# Patient Record
Sex: Female | Born: 1965 | Race: White | Hispanic: No | Marital: Married | Smoking: Never smoker
Health system: Southern US, Community
[De-identification: ages and names within clinical notes are randomized; demographics above are authoritative.]

## PROBLEM LIST (undated history)

## (undated) DIAGNOSIS — R7301 Impaired fasting glucose: Secondary | ICD-10-CM

## (undated) DIAGNOSIS — E669 Obesity, unspecified: Secondary | ICD-10-CM

## (undated) DIAGNOSIS — I82409 Acute embolism and thrombosis of unspecified deep veins of unspecified lower extremity: Secondary | ICD-10-CM

## (undated) DIAGNOSIS — I472 Ventricular tachycardia, unspecified: Secondary | ICD-10-CM

## (undated) DIAGNOSIS — I8390 Asymptomatic varicose veins of unspecified lower extremity: Secondary | ICD-10-CM

## (undated) DIAGNOSIS — N83209 Unspecified ovarian cyst, unspecified side: Secondary | ICD-10-CM

## (undated) DIAGNOSIS — I444 Left anterior fascicular block: Secondary | ICD-10-CM

## (undated) DIAGNOSIS — A770 Spotted fever due to Rickettsia rickettsii: Secondary | ICD-10-CM

## (undated) DIAGNOSIS — E559 Vitamin D deficiency, unspecified: Secondary | ICD-10-CM

## (undated) DIAGNOSIS — F419 Anxiety disorder, unspecified: Secondary | ICD-10-CM

## (undated) DIAGNOSIS — K8020 Calculus of gallbladder without cholecystitis without obstruction: Secondary | ICD-10-CM

## (undated) DIAGNOSIS — I10 Essential (primary) hypertension: Secondary | ICD-10-CM

## (undated) DIAGNOSIS — O24419 Gestational diabetes mellitus in pregnancy, unspecified control: Secondary | ICD-10-CM

## (undated) HISTORY — DX: Acute embolism and thrombosis of unspecified deep veins of unspecified lower extremity: I82.409

## (undated) HISTORY — DX: Spotted fever due to Rickettsia rickettsii: A77.0

## (undated) HISTORY — DX: Essential (primary) hypertension: I10

## (undated) HISTORY — DX: Gestational diabetes mellitus in pregnancy, unspecified control: O24.419

## (undated) HISTORY — DX: Impaired fasting glucose: R73.01

## (undated) HISTORY — PX: CARDIAC CATHETERIZATION: SHX172

## (undated) HISTORY — DX: Obesity, unspecified: E66.9

## (undated) HISTORY — DX: Left anterior fascicular block: I44.4

## (undated) HISTORY — PX: CHOLECYSTECTOMY: SHX55

## (undated) HISTORY — DX: Vitamin D deficiency, unspecified: E55.9

## (undated) HISTORY — DX: Ventricular tachycardia, unspecified: I47.20

## (undated) HISTORY — DX: Anxiety disorder, unspecified: F41.9

## (undated) HISTORY — DX: Ventricular tachycardia: I47.2

## (undated) HISTORY — DX: Calculus of gallbladder without cholecystitis without obstruction: K80.20

## (undated) HISTORY — DX: Unspecified ovarian cyst, unspecified side: N83.209

## (undated) HISTORY — DX: Asymptomatic varicose veins of unspecified lower extremity: I83.90

## (undated) HISTORY — PX: LIVER BIOPSY: SHX301

---

## 1998-10-19 DIAGNOSIS — O24419 Gestational diabetes mellitus in pregnancy, unspecified control: Secondary | ICD-10-CM

## 1998-10-19 HISTORY — DX: Gestational diabetes mellitus in pregnancy, unspecified control: O24.419

## 1999-06-13 ENCOUNTER — Inpatient Hospital Stay (HOSPITAL_COMMUNITY): Admission: AD | Admit: 1999-06-13 | Discharge: 1999-06-13 | Payer: Self-pay | Admitting: Obstetrics & Gynecology

## 1999-06-22 ENCOUNTER — Inpatient Hospital Stay (HOSPITAL_COMMUNITY): Admission: AD | Admit: 1999-06-22 | Discharge: 1999-06-25 | Payer: Self-pay | Admitting: Obstetrics and Gynecology

## 1999-06-28 ENCOUNTER — Encounter (HOSPITAL_COMMUNITY): Admission: RE | Admit: 1999-06-28 | Discharge: 1999-09-26 | Payer: Self-pay | Admitting: Obstetrics and Gynecology

## 1999-07-25 ENCOUNTER — Other Ambulatory Visit: Admission: RE | Admit: 1999-07-25 | Discharge: 1999-07-25 | Payer: Self-pay | Admitting: Obstetrics and Gynecology

## 2000-07-28 ENCOUNTER — Other Ambulatory Visit: Admission: RE | Admit: 2000-07-28 | Discharge: 2000-07-28 | Payer: Self-pay | Admitting: Obstetrics & Gynecology

## 2000-08-03 ENCOUNTER — Encounter: Admission: RE | Admit: 2000-08-03 | Discharge: 2000-11-01 | Payer: Self-pay | Admitting: Obstetrics & Gynecology

## 2001-02-07 ENCOUNTER — Inpatient Hospital Stay (HOSPITAL_COMMUNITY): Admission: AD | Admit: 2001-02-07 | Discharge: 2001-02-07 | Payer: Self-pay | Admitting: Obstetrics & Gynecology

## 2001-02-09 ENCOUNTER — Inpatient Hospital Stay (HOSPITAL_COMMUNITY): Admission: AD | Admit: 2001-02-09 | Discharge: 2001-02-11 | Payer: Self-pay | Admitting: Obstetrics & Gynecology

## 2002-10-19 DIAGNOSIS — A770 Spotted fever due to Rickettsia rickettsii: Secondary | ICD-10-CM

## 2002-10-19 HISTORY — DX: Spotted fever due to Rickettsia rickettsii: A77.0

## 2003-01-10 ENCOUNTER — Other Ambulatory Visit: Admission: RE | Admit: 2003-01-10 | Discharge: 2003-01-10 | Payer: Self-pay | Admitting: Obstetrics & Gynecology

## 2003-02-08 ENCOUNTER — Encounter: Admission: RE | Admit: 2003-02-08 | Discharge: 2003-05-09 | Payer: Self-pay | Admitting: Obstetrics & Gynecology

## 2003-06-06 ENCOUNTER — Encounter: Admission: RE | Admit: 2003-06-06 | Discharge: 2003-06-06 | Payer: Self-pay | Admitting: Family Medicine

## 2003-06-06 ENCOUNTER — Encounter: Payer: Self-pay | Admitting: Family Medicine

## 2005-09-07 ENCOUNTER — Other Ambulatory Visit: Admission: RE | Admit: 2005-09-07 | Discharge: 2005-09-07 | Payer: Self-pay | Admitting: Internal Medicine

## 2007-07-28 ENCOUNTER — Encounter: Admission: RE | Admit: 2007-07-28 | Discharge: 2007-07-28 | Payer: Self-pay | Admitting: *Deleted

## 2010-04-09 ENCOUNTER — Other Ambulatory Visit: Admission: RE | Admit: 2010-04-09 | Discharge: 2010-04-09 | Payer: Self-pay | Admitting: Family Medicine

## 2010-09-24 ENCOUNTER — Ambulatory Visit: Payer: Self-pay | Admitting: Vascular Surgery

## 2010-10-28 ENCOUNTER — Inpatient Hospital Stay (HOSPITAL_COMMUNITY)
Admission: EM | Admit: 2010-10-28 | Discharge: 2010-10-30 | Payer: Self-pay | Source: Home / Self Care | Attending: Cardiology | Admitting: Cardiology

## 2010-10-29 ENCOUNTER — Encounter (INDEPENDENT_AMBULATORY_CARE_PROVIDER_SITE_OTHER): Payer: Self-pay | Admitting: Cardiology

## 2010-11-03 LAB — LIPID PANEL
Cholesterol: 210 mg/dL — ABNORMAL HIGH (ref 0–200)
HDL: 56 mg/dL (ref >39)
LDL Cholesterol: 130 mg/dL — ABNORMAL HIGH (ref 0–99)
Total CHOL/HDL Ratio: 3.8 RATIO
Triglycerides: 120 mg/dL (ref <150)
VLDL: 24 mg/dL (ref 0–40)

## 2010-11-03 LAB — CBC
HCT: 39.5 % (ref 36.0–46.0)
HCT: 37.6 % (ref 36.0–46.0)
HCT: 37.1 % (ref 36.0–46.0)
Hemoglobin: 13 g/dL (ref 12.0–15.0)
Hemoglobin: 12.4 g/dL (ref 12.0–15.0)
Hemoglobin: 12.1 g/dL (ref 12.0–15.0)
MCH: 27.1 pg (ref 26.0–34.0)
MCH: 27.5 pg (ref 26.0–34.0)
MCH: 27.2 pg (ref 26.0–34.0)
MCHC: 32.9 g/dL (ref 30.0–36.0)
MCHC: 33 g/dL (ref 30.0–36.0)
MCHC: 32.6 g/dL (ref 30.0–36.0)
MCV: 82.3 fL (ref 78.0–100.0)
MCV: 83.4 fL (ref 78.0–100.0)
MCV: 83.4 fL (ref 78.0–100.0)
Platelets: 206 10*3/uL (ref 150–400)
Platelets: 202 10*3/uL (ref 150–400)
Platelets: 204 10*3/uL (ref 150–400)
RBC: 4.8 MIL/uL (ref 3.87–5.11)
RBC: 4.51 MIL/uL (ref 3.87–5.11)
RBC: 4.45 MIL/uL (ref 3.87–5.11)
RDW: 14.1 % (ref 11.5–15.5)
RDW: 14.6 % (ref 11.5–15.5)
RDW: 14.6 % (ref 11.5–15.5)
WBC: 8.2 10*3/uL (ref 4.0–10.5)
WBC: 5.2 10*3/uL (ref 4.0–10.5)
WBC: 7.2 10*3/uL (ref 4.0–10.5)

## 2010-11-03 LAB — COMPREHENSIVE METABOLIC PANEL
ALT: 27 U/L (ref 0–35)
AST: 22 U/L (ref 0–37)
Albumin: 3.5 g/dL (ref 3.5–5.2)
Alkaline Phosphatase: 50 U/L (ref 39–117)
BUN: 7 mg/dL (ref 6–23)
CO2: 23 mEq/L (ref 19–32)
Calcium: 8.7 mg/dL (ref 8.4–10.5)
Chloride: 107 mEq/L (ref 96–112)
Creatinine, Ser: 0.78 mg/dL (ref 0.4–1.2)
GFR calc Af Amer: >60 mL/min (ref >60)
GFR calc non Af Amer: >60 mL/min (ref >60)
Glucose, Bld: 101 mg/dL — ABNORMAL HIGH (ref 70–99)
Potassium: 4.3 mEq/L (ref 3.5–5.1)
Sodium: 138 mEq/L (ref 135–145)
Total Bilirubin: 0.6 mg/dL (ref 0.3–1.2)
Total Protein: 6.8 g/dL (ref 6.0–8.3)

## 2010-11-03 LAB — TSH: TSH: 1.392 u[IU]/mL (ref 0.350–4.500)

## 2010-11-03 LAB — URINALYSIS, ROUTINE W REFLEX MICROSCOPIC
Bilirubin Urine: NEGATIVE
Hgb urine dipstick: NEGATIVE
Ketones, ur: NEGATIVE mg/dL
Nitrite: NEGATIVE
Protein, ur: NEGATIVE mg/dL
Specific Gravity, Urine: 1.012 (ref 1.005–1.030)
Urine Glucose, Fasting: NEGATIVE mg/dL
Urobilinogen, UA: 0.2 mg/dL (ref 0.0–1.0)
pH: 7.5 (ref 5.0–8.0)

## 2010-11-03 LAB — BASIC METABOLIC PANEL
BUN: 7 mg/dL (ref 6–23)
BUN: 7 mg/dL (ref 6–23)
CO2: 25 mEq/L (ref 19–32)
CO2: 23 mEq/L (ref 19–32)
Calcium: 9 mg/dL (ref 8.4–10.5)
Calcium: 8.4 mg/dL (ref 8.4–10.5)
Chloride: 104 mEq/L (ref 96–112)
Chloride: 106 mEq/L (ref 96–112)
Creatinine, Ser: 0.93 mg/dL (ref 0.4–1.2)
Creatinine, Ser: 0.74 mg/dL (ref 0.4–1.2)
GFR calc Af Amer: >60 mL/min (ref >60)
GFR calc Af Amer: >60 mL/min (ref >60)
GFR calc non Af Amer: >60 mL/min (ref >60)
GFR calc non Af Amer: >60 mL/min (ref >60)
Glucose, Bld: 119 mg/dL — ABNORMAL HIGH (ref 70–99)
Glucose, Bld: 115 mg/dL — ABNORMAL HIGH (ref 70–99)
Potassium: 3.8 mEq/L (ref 3.5–5.1)
Potassium: 4 mEq/L (ref 3.5–5.1)
Sodium: 137 mEq/L (ref 135–145)
Sodium: 135 mEq/L (ref 135–145)

## 2010-11-03 LAB — CARDIAC PANEL(CRET KIN+CKTOT+MB+TROPI)
CK, MB: 3.3 ng/mL (ref 0.3–4.0)
CK, MB: 4.6 ng/mL — ABNORMAL HIGH (ref 0.3–4.0)
Relative Index: INVALID (ref 0.0–2.5)
Relative Index: INVALID (ref 0.0–2.5)
Total CK: 97 U/L (ref 7–177)
Total CK: 98 U/L (ref 7–177)
Troponin I: 0.23 ng/mL — ABNORMAL HIGH (ref 0.00–0.06)
Troponin I: 0.46 ng/mL — ABNORMAL HIGH (ref 0.00–0.06)

## 2010-11-03 LAB — CK TOTAL AND CKMB (NOT AT ARMC)
CK, MB: 4.4 ng/mL — ABNORMAL HIGH (ref 0.3–4.0)
Relative Index: INVALID (ref 0.0–2.5)
Total CK: 91 U/L (ref 7–177)

## 2010-11-03 LAB — DIFFERENTIAL
Basophils Absolute: 0 10*3/uL (ref 0.0–0.1)
Basophils Relative: 0 % (ref 0–1)
Eosinophils Absolute: 0.1 10*3/uL (ref 0.0–0.7)
Eosinophils Relative: 2 % (ref 0–5)
Lymphocytes Relative: 11 % — ABNORMAL LOW (ref 12–46)
Lymphs Abs: 0.9 10*3/uL (ref 0.7–4.0)
Monocytes Absolute: 0.6 10*3/uL (ref 0.1–1.0)
Monocytes Relative: 7 % (ref 3–12)
Neutro Abs: 6.6 10*3/uL (ref 1.7–7.7)
Neutrophils Relative %: 80 % — ABNORMAL HIGH (ref 43–77)

## 2010-11-03 LAB — POCT CARDIAC MARKERS
CKMB, poc: 1.3 ng/mL (ref 1.0–8.0)
Myoglobin, poc: 125 ng/mL (ref 12–200)
Troponin i, poc: 0.08 ng/mL (ref 0.00–0.09)

## 2010-11-03 LAB — APTT: aPTT: 32 seconds (ref 24–37)

## 2010-11-03 LAB — URINE MICROSCOPIC-ADD ON

## 2010-11-03 LAB — TROPONIN I: Troponin I: 0.25 ng/mL — ABNORMAL HIGH (ref 0.00–0.06)

## 2010-11-03 LAB — HEMOGLOBIN A1C
Hgb A1c MFr Bld: 6 % — ABNORMAL HIGH (ref <5.7)
Mean Plasma Glucose: 126 mg/dL — ABNORMAL HIGH (ref <117)

## 2010-11-03 LAB — PROTIME-INR
INR: 0.94 (ref 0.00–1.49)
Prothrombin Time: 12.8 seconds (ref 11.6–15.2)

## 2010-11-03 LAB — MAGNESIUM: Magnesium: 2.1 mg/dL (ref 1.5–2.5)

## 2010-11-03 LAB — PREGNANCY, URINE: Preg Test, Ur: NEGATIVE

## 2010-11-06 NOTE — Consult Note (Signed)
NAMESHERESE, HEYWARD                 ACCOUNT NO.:  [id number]  MEDICAL RECORD NO.:  [id number]          PATIENT TYPE:  INP  LOCATION:  2027                         FACILITY:  [facility]  PHYSICIAN:  Duke Salvia, MD, FACCDATE OF BIRTH:  12-Feb-1966  DATE OF CONSULTATION:  10/29/2010 DATE OF DISCHARGE:                                CONSULTATION   Thank you very much for asking [name] to see Ruth Matthews in consultation because of wide complex tachycardia.  The patient is a 45 year old woman with essentially negative past medical history except for modest obesity and mild elevation of her hemoglobin A1c who 3 years ago had an episode of tachy palpitations associated with presyncope.  She had a similar recurrence yesterday again while exercising.  She felt quite dizzy and short of breath, was aware of palpitations, had mild chest discomfort.  She drove herself over to Urgent Care where she was found to be a tachycardia, rate of about 250 beats per minute.  EMS was called.  He apparently gave her amiodarone; I have no records as to whether they gave her adenosine or not.  Approximately 5 minutes later, the tachycardia terminated and she was found to be in atrial fibrillation.  She has no other significant cardiac symptoms.  She has had no problem with exercise intolerance, in fact she has been working out at the gym quite vigorously for the last 3-4 months.  Her prior episode was associated with a stress test evaluation, which was apparently normal.  Since in hospital, she has undergone an echocardiogram, which demonstrated normal left ventricular function.  No comments were made as to the presence of a moderator band.  She underwent catheterization today, demonstrated normal left ventricular function, normal coronary arteries.  Laboratory testing demonstrated moderate elevation of the LDL and elevated troponin with a peak of 0.46.  Her past surgical history is notable for C-section  and laparoscopic cholecystectomy.  Her past medical history in addition to the above is notable for mild edema and chronically low vitamin D levels.  SOCIAL HISTORY:  She is a Chartered loss adjuster.  She has two children 11 and 9. She drinks alcohol rarely.  She denies use of cigarettes or recreational drugs.  Her family history is notable for diabetes, is otherwise broadly negative.  Her review of systems is also broadly negative across all organ systems.  PHYSICAL EXAMINATION:  GENERAL:  She is a middle-aged to younger Caucasian female appearing her stated age of 60. VITAL SIGNS:  Her blood pressure is 128/82.  Her pulse was 97.  She was afebrile. HEENT:  Normal. NECK:  Veins were flat.  Her carotids were brisk and full without bruits. BACK:  Without kyphosis, scoliosis. LUNGS:  Clear. HEART:  Sounds were regular without murmurs or gallops. ABDOMEN:  Soft with active bowel sounds.  There is no hepatomegaly. EXTREMITIES:  Femoral pulses were not examined.  Distal pulses were trace.  There was no clubbing, cyanosis, or edema. NEUROLOGIC:  Grossly normal. SKIN:  Warm and dry.  Electrocardiogram dated this morning demonstrated sinus rhythm at 72 with intervals of 0.17/0.07/0.42 with an axis of 44  degrees.  There was an R-prime in lead V1.  Tachycardia, electrocardiogram was described previously and demonstrates a broad QRS tachycardia with a cycle length of approximately 240 milliseconds or so.  The QRS morphology is monophasic upright in lead V1.  There is transition at V4.  There is a borderline northwest axis with negativity in leads I, II, and III.  IMPRESSION: 1. 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     Morphology.     b.     The atrial fibrillation as a residual rhythm following      termination. 2. Normal ECG and left ventricular function and coronary arteries.  [name] has a wide complex  tachycardia that is probably ventricular in origin occurring in the setting of what is thus far thought to be a structurally normal heart.  I think further clarification of her cardiac substrate is essential, and a signal-averaged ECG will be ordered, and a cardiac MRI should be obtained to look for this.  The morphology suggests origin in the floor of the left ventricle, which is also called a Belhassen VT or verapamil sensitive VT or left fascicular VT.  These can be treated medically as well as ablated in the event that they are persistent and problematic.  Based on the above, I would therefore: 1. Discontinue her beta-blocker. 2. Begin verapamil. 3. Cardiac MRI. 4. Signal-averaged ECG. 5. I think about doing much of the workup as an outpatient as     necessary.  Thank you for the consultation.     Duke Salvia, MD, [facility]     SCK/MEDQ  D:  10/29/2010  T:  10/30/2010  Job:  [id number]  Electronically Signed by Sherryl Manges MD [facility] on 11/06/2010 01:41:06 PM
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
## 2010-11-07 NOTE — Discharge Summary (Signed)
Ruth Matthews, Ruth Matthews                 ACCOUNT NO.:  [id number]  MEDICAL RECORD NO.:  [id number]          PATIENT TYPE:  INP  LOCATION:  2027                         FACILITY:  [facility]  PHYSICIAN:  Jake Bathe, MD      DATE OF BIRTH:  [birth year]  DATE OF ADMISSION:  10/28/2010 DATE OF DISCHARGE:  10/30/2010                              DISCHARGE SUMMARY   CARDIOLOGIST:  Loraine Leriche C. Anne Fu, MD.  PRIMARY PHYSICIAN:  Dr. Juluis Rainier.  ELECTROPHYSIOLOGIST:  Duke Salvia, MD, [facility] saw in consultation  FINAL DIAGNOSES: 1. Wide complex tachycardia - after review from Electrophysiology felt     to be left fascicular ventricular tachycardia, right bundle-branch     block pattern, right axis deviation, negative deflection in lead     V4, V5, V6 as well as negative in the inferior leads. 2. Paroxysmal atrial fibrillation - this was a residual rhythm post     term for a few hours after cardioversion with IV amiodarone given     by EMS.  When she got to the floor, she was in sinus rhythm and     maintained this throughout her hospitalization with no further     evidence of ventricular tachycardia. 3. Non-ST elevation myocardial infarction - type 2 secondary to rapid     ventricular rate during ventricular tachycardia of 256 beats per     minute.  PROCEDURE PERFORMED: 1. Cardiac catheterization on October 29, 2010, which showed no     evidence of any coronary artery disease and normal LV function.     The right radial artery approach was attempted but unsuccessful,     and therefore transitioned to the right groin, which was     successful.  On the morning of discharge, she had a small hematoma     in the right radial artery site and was slightly tender.  However,     she had good distal pulse.  She was ambulating well. Cardiac biomarkers were mildly positive with a peak MB 4.4 and a peak troponin of 0.46.  Cardiac enzymes were trending down on the third set. 1. Impaired glucose  tolerance - hemoglobin A1c 6.0, random fasting     glucoses were 115.  TSH was normal. 2. Obesity.  HOSPITAL COURSE:  A 45 year old female who was admitted via EMS after an episode of ventricular tachycardia.  She was exercising at the [facility], and her heart rate would not slow down and she felt dizzy and short of breath, but no syncope.  Approximately 3 years ago, she had similar episode, but an EKG was not performed during the actual episode itself. She had a treadmill test done at that time 3 years ago, which was reportedly normal.  EMS was called after she went to Urgent Care Center and placed an IV and gave her amiodarone, which promptly within 5 minutes or so converted her ventricular tachycardia to atrial fibrillation, which lasted approximately an hour or two.  At the end of her emergency department stay, she was in sinus rhythm.  Cardiac catheterization was performed secondary to mild elevation in  cardiac biomarkers, which was likely secondary to demand ischemia from rapid heart rate as described above.  No evidence of any thrombus or coronary artery disease identified.  For her ventricular tachycardia, originally, she was placed on beta- blocker, but this was switched to verapamil 240 mg once a day secondary to recommendation made by Dr. Graciela Husbands of Electrophysiology as left fascicular ventricular tachycardia can be sensitive to verapamil.  Dr. Graciela Husbands also recommended cardiac MRI and signal-averaged EKG.  She will be having the cardiac MRI done on the day of discharge and as well as the signal-averaged EKG.  Findings are currently pending.  She feels well, had some slight dizziness when standing, but otherwise is doing well.  Small hematoma at radial wrist site.  Normal pulse. Blood pressure 122/70.  No ventricular tachycardia or atrial fibrillation on telemetry.  OTHER LABORATORY DATA:  Total cholesterol 210, triglycerides 120, HDL 56, LDL 130.  FOLLOWUP:  She will  follow up with Dr. Anne Fu in Cardiology in 1-2 weeks.  We will likely perform an exercise treadmill test as an outpatient.  DISCHARGE INSTRUCTIONS:  She knows not to perform any vigorous exercise currently.  She can walk.  I asked about exercise recommendations to Dr. Ladona Ridgel who reviewed her chart.  DISCHARGE MEDICATIONS: 1. Aspirin 81 mg a day. 2. Verapamil sustained release 240 mg once a day.  Once again, cardiac MRI is pending at the time of this dictation as well as signal average EKG, which will be read by Dr. Graciela Husbands.  35 minutes spent on discharge, med reconciliation, patient instructions, review of medical records.     Jake Bathe, MD     MCS/MEDQ  D:  10/30/2010  T:  10/30/2010  Job:  [id number]  cc:   Dr. Juluis Rainier  Electronically Signed by Donato Schultz MD on 11/07/2010 07:03:08 AM

## 2010-11-09 ENCOUNTER — Encounter: Payer: Self-pay | Admitting: Family Medicine

## 2010-12-01 NOTE — Procedures (Signed)
NAMEJANIEL, Ruth Matthews                 ACCOUNT NO.:  [id number]  MEDICAL RECORD NO.:  [id number]          PATIENT TYPE:  INP  LOCATION:  2027                         FACILITY:  [facility]  PHYSICIAN:  Corky Crafts, MDDATE OF BIRTH:  1966-06-04  DATE OF PROCEDURE:  10/29/2010 DATE OF DISCHARGE:                           CARDIAC CATHETERIZATION   PRIMARY CARDIOLOGIST:  Loraine Leriche C. Anne Fu, MD  PRIMARY CARE PHYSICIAN:  Dr. Juluis Rainier.  PROCEDURE PERFORMED:  Left heart catheterization, left ventriculogram, coronary angiogram, abdominal aortogram.  OPERATOR:  Corky Crafts, MD  INDICATIONS:  Ventricular tachycardia, abnormal troponin.  PROCEDURE NARRATIVE:  The risks and benefits of cardiac catheterization were explained to the patient and informed consent was obtained.  She was brought to the cath lab.  She was prepped and draped in usual sterile fashion.  Her right wrist was infiltrated with 1% lidocaine. Several attempts were made accessing the right radial artery.  There was good blood flow from the needle, but the wire would not pass.  At times, the blood flow will be good and quickly stopped.  I think there was likely vasospasm.  We abandoned the radial approach after pressure was held and went to the right groin.  A 5-French sheath was placed into the right femoral artery after lidocaine was given.  The modified Seldinger technique was used.  Left coronary artery angiography was performed using a JL-4 pigtail catheter.  Catheter was advanced to vessel ostium under fluoroscopic guidance.  Digital angiography was performed in multiple projections using hand injection of contrast.  Right coronary artery angiography was performed using a JL-4 catheter in similar fashion.  Pigtail catheter was placed to the left ventricle under fluoroscopic guidance.  Power injection of contrast performed under fluoroscopy, and the left ventriculogram was performed.  The catheter was  pulled back under continuous hemodynamic pressure monitoring. Pigtail was withdrawn to the abdominal aorta.  A power injection of contrast performed in the abdomen to image the aorta.  The sheath will be removed using manual compression.  FINDINGS:  The left main is widely patent, angiographically normal. Left circumflex is a large vessel and appears angiographically normal. There is a large tortuous OM-1, which is angiographically normal. Left anterior descending is a large vessel, which appears angiographically normal and reaches the apex.  There is a large first diagonal, which is also angiographically normal. The right coronary artery is a large dominant vessel, which appears angiographically normal.  The vessel divides into a medium-sized posterolateral and posterior descending artery, both of which appear normal. Left ventriculogram showed normal ventricular function with an estimated ejection fraction of 55%.  HEMODYNAMICS:  Left ventricle pressure 117/0, LVEDP of 8, aortic pressure 123/73 with a mean aortic pressure of 96 mmHg.  The abdominal aortogram showed no abdominal aortic aneurysm, and bilateral single renal arteries were patent.  IMPRESSION: 1. No angiographically apparent coronary artery disease. 2. Normal ventricular function. 3. Normal hemodynamics. 4. Unsuccessful attempt at right radial catheterization due to     vasospasm.  RECOMMENDATIONS:  The patient will need her sheath pulled and to do 4 hours of bedrest.  She will  need an EP evaluation, and aggressive preventive therapy, watch the right wrist as well to monitor for any signs of bleeding.     Corky Crafts, MD     JSV/MEDQ  D:  10/29/2010  T:  10/30/2010  Job:  [id number]  Electronically Signed by Lance Muss MD on 12/01/2010 09:05:30 AM

## 2010-12-30 ENCOUNTER — Ambulatory Visit (INDEPENDENT_AMBULATORY_CARE_PROVIDER_SITE_OTHER): Payer: BC Managed Care – PPO | Admitting: Vascular Surgery

## 2010-12-30 DIAGNOSIS — I83893 Varicose veins of bilateral lower extremities with other complications: Secondary | ICD-10-CM

## 2010-12-30 NOTE — Assessment & Plan Note (Signed)
OFFICE VISIT  Ruth, Matthews DOB:  1966-10-07                                       12/30/2010 [id number]#:[id number]  Patient presents today for continued follow-up of her severe bilateral venous hypertension.  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  She has had a change of work status to half-time due to leg pain.  She reports that she has had to alter her household chores, cooking, and cleaning and had to delegate these activities with family members due to the pain and also reports that she has had to decrease the duration and frequency of her walking program.  She has to elevate her legs when possible and does take ibuprofen for discomfort.  Her physical exam is unchanged.  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  She did have a hospitalization in January 2012 for ventricular tachycardia and is now on verapamil, which controls her rate.  I rediscussed her ultrasound studies with patient and relooked with the SonoSite.  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  I have recommended staged bilateral laser ablation of her great saphenous vein with stab phlebectomy to multiple tributaries.  She also has a very prominent telangiectasia on her medial right calf that looks to be the type that would bleed; therefore, I have recommended sclerotherapy of this area as well, both for discomfort and to prevent it from bleeding.  She wished to proceed with her left leg first and says this is the most symptomatic, and we will proceed with right leg treatment to follow several weeks after her recovery.    Larina Earthly, M.D. Electronically Signed  TFE/MEDQ  D:  12/30/2010  T:  12/30/2010  Job:  [id number]  cc:   Dossie Der, MD

## 2011-01-28 ENCOUNTER — Other Ambulatory Visit (INDEPENDENT_AMBULATORY_CARE_PROVIDER_SITE_OTHER): Payer: BC Managed Care – PPO | Admitting: Vascular Surgery

## 2011-01-28 DIAGNOSIS — I83893 Varicose veins of bilateral lower extremities with other complications: Secondary | ICD-10-CM

## 2011-01-29 NOTE — Assessment & Plan Note (Signed)
OFFICE VISIT  Ruth Matthews, Ruth Matthews DOB:  1966/07/30                                       01/28/2011 ZOXWR#:[id number]  The patient presents today for treatment of left leg venous hypertension.  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  She underwent left laser ablation from her distal thigh up to just below the saphenofemoral junction and stab phlebectomy of multiple tributaries varicosities in her thigh and calf.  She did well without any complications.  This was under local anesthetic.  She was discharged to home.  We will see her in 1 week for followup duplex.  She does have a similar difficulty in the right leg and we have tentatively scheduled her right leg ablation and stab phlebectomy for Morse Brueggemann May.    Larina Earthly, M.D. Electronically Signed  TFE/MEDQ  D:  01/28/2011  T:  01/29/2011  Job:  [id number]  cc:   Juluis Rainier, M.D.

## 2011-02-03 ENCOUNTER — Ambulatory Visit (INDEPENDENT_AMBULATORY_CARE_PROVIDER_SITE_OTHER): Payer: BC Managed Care – PPO | Admitting: Vascular Surgery

## 2011-02-03 ENCOUNTER — Encounter (INDEPENDENT_AMBULATORY_CARE_PROVIDER_SITE_OTHER): Payer: BC Managed Care – PPO

## 2011-02-03 DIAGNOSIS — I83893 Varicose veins of bilateral lower extremities with other complications: Secondary | ICD-10-CM

## 2011-02-03 DIAGNOSIS — Z48812 Encounter for surgical aftercare following surgery on the circulatory system: Secondary | ICD-10-CM

## 2011-02-03 DIAGNOSIS — R609 Edema, unspecified: Secondary | ICD-10-CM

## 2011-02-03 NOTE — Assessment & Plan Note (Signed)
OFFICE VISIT  Ruth Matthews, Ruth Matthews DOB:  1966-01-15                                       02/03/2011 HQION#:[id number]  Patient returns today, having undergone laser ablation of her left great saphenous vein with multiple stab phlebectomies by Dr. 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  She has had a moderate amount of discomfort in the thigh and calf area and states that this is slowly beginning to improve. She has been wearing her elastic compression stocking and taking ibuprofen as instructed.  She also has bulging varicosities in her contralateral right leg which are symptomatic.  On exam today, she has some mild to moderate discomfort along the course of the left great saphenous vein from the knee to the groin and some moderate ecchymosis in this area.  Stab phlebectomy sites are healing nicely with minimal distal edema.  Venous duplex exam reveals no DVT with occlusion of the left great saphenous vein.  There is some reflux arising from an incompetent perforator.  In general, the patient is satisfactorily progressing, is scheduled for May 2nd to have the contralateral leg performed.  She states if this is not improving rapidly enough, she may put this off, but she will be in touch with [name] to let us know that in ample time.    Quita Skye Hart [place], M.D. Electronically Signed  JDL/MEDQ  D:  02/03/2011  T:  02/03/2011  Job:  [id number]

## 2011-02-11 NOTE — Procedures (Unsigned)
DUPLEX DEEP VENOUS EXAM - LOWER EXTREMITY  INDICATION:  Left lower extremity followup of laser ablation.  HISTORY:  Edema:  Yes Trauma/Surgery:  Left EVLA 01/28/2011 Pain:  Yes PE:  No Previous DVT:  No Anticoagulants:  81 mg of aspirin daily  DUPLEX EXAM:               CFV   SFV   PopV  PTV    GSV               R  L  R  L  R  L  R   L  R  L Thrombosis    o  o     o     o      o     + Spontaneous   +  +     +     +      +     o Phasic        +  +     +     +      +     o Augmentation  +  +     +     +      +     o Compressible  +  +     +     +      +     o Competent     +  +     +     +      +  Legend:  + - yes  o - no  p - partial  D - decreased  IMPRESSION: 1. No evidence of deep vein thrombosis of the left lower extremity. 2. Good  post ablation result length of greater saphenous vein     ablated. 3. Reflux of >500 milliseconds present throughout the left lower     extremity deep venous system and a perforator vein measuring 0.27     cm arising off one posterior tibial vein in the mid distal calf     segment. 4. The contralateral common femoral vein is compressible with reflux     of >500 milliseconds present.    _____________________________ Larina Earthly, M.D.  SH/MEDQ  D:  02/03/2011  T:  02/03/2011  Job:  [id number]

## 2011-02-17 HISTORY — PX: ENDOVENOUS ABLATION SAPHENOUS VEIN W/ LASER: SUR449

## 2011-02-18 ENCOUNTER — Other Ambulatory Visit (INDEPENDENT_AMBULATORY_CARE_PROVIDER_SITE_OTHER): Payer: BC Managed Care – PPO | Admitting: Vascular Surgery

## 2011-02-18 DIAGNOSIS — I83893 Varicose veins of bilateral lower extremities with other complications: Secondary | ICD-10-CM

## 2011-02-19 NOTE — Assessment & Plan Note (Signed)
OFFICE VISIT  [name], [name] DOB:  04-Feb-1966                                       02/18/2011 JWJXB#:[id number]  Patient presents today for treatment of her right leg venous hypertension.  She is status post left leg ablation and stab phlebectomy on 04/11.  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  Also underwent sclerotherapy of a very prominent superficial telangiectasia at several areas over her pretibial area.  She had no immediate complication and will see [name] again in 1 week for repeat duplex.    Larina Earthly, M.D. Electronically Signed  TFE/MEDQ  D:  02/18/2011  T:  02/19/2011  Job:  [id number]

## 2011-02-24 ENCOUNTER — Ambulatory Visit (INDEPENDENT_AMBULATORY_CARE_PROVIDER_SITE_OTHER): Payer: BC Managed Care – PPO | Admitting: Vascular Surgery

## 2011-02-24 ENCOUNTER — Encounter (INDEPENDENT_AMBULATORY_CARE_PROVIDER_SITE_OTHER): Payer: BC Managed Care – PPO

## 2011-02-24 DIAGNOSIS — M79609 Pain in unspecified limb: Secondary | ICD-10-CM

## 2011-02-24 DIAGNOSIS — Z48812 Encounter for surgical aftercare following surgery on the circulatory system: Secondary | ICD-10-CM

## 2011-02-24 DIAGNOSIS — I83893 Varicose veins of bilateral lower extremities with other complications: Secondary | ICD-10-CM

## 2011-02-24 DIAGNOSIS — M7989 Other specified soft tissue disorders: Secondary | ICD-10-CM

## 2011-02-25 ENCOUNTER — Ambulatory Visit: Payer: BC Managed Care – PPO | Admitting: Vascular Surgery

## 2011-02-25 NOTE — Assessment & Plan Note (Signed)
OFFICE VISIT  VALICIA, RIEF DOB:  07-Feb-1966                                       02/24/2011 ZOXWR#:[id number]  The patient presents today for a 1 week followup of laser ablation of her right great saphenous vein and stab phlebectomy of multiple tributaries in her calf.  She had undergone a similar treatment on her left leg on 01/28/11.  She has the usual amount of soreness around her phlebectomy site and her ablation site, does have some blistering in her popliteal space from the compression garment.  She underwent venous duplex that shows ablation of her great saphenous vein and no evidence of injury at her common femoral vein.  She does have a finding of clot in her popliteal vein extending into her gastrocnemius.  This is quite unusual since this was nowhere near the area manipulation.  I discussed this at great length with the patient.  I explained this is clearly related to the procedure.  I explained the need for anticoagulation therapy.  I have discussed this with her pharmacist and have written a prescription for 80 mg Lovenox starting today and continuing for 5 days. I have also written her for Coumadin 10 mg starting today.  We will discuss this with [facility] to arrange her to have regulation of this.  We will contact her by telephone tomorrow to confirm this.  I will see her again in 2 weeks for continued followup.    Larina Earthly, M.D. Electronically Signed  TFE/MEDQ  D:  02/24/2011  T:  02/25/2011  Job:  [id number]

## 2011-02-26 NOTE — Procedures (Unsigned)
DUPLEX DEEP VENOUS EXAM - LOWER EXTREMITY  INDICATION:  Follow up right lower extremity post ablation, pain and swelling.  HISTORY:  Edema:  Yes. Trauma/Surgery:  Right lower extremity ablation on 02/18/2011. Pain:  Yes. PE:  No. Previous DVT:  No. Anticoagulants:  Not currently, previously on a baby aspirin once a day. Other:  DUPLEX EXAM:               CFV   SFV   PopV  PTV    GSV               R  L  R  L  R  L  R   L  R  L Thrombosis    o  o  o     p     o      + Spontaneous   +  +  +     +     +      o Phasic        +  +  +     +     +      o Augmentation  +  +  +     d     +      o Compressible  +  +  +     p     +      o Competent     +  +  +     +     +      o  Legend:  + - yes  o - no  p - partial  D - decreased  IMPRESSION: 1. Acute deep venous thrombosis present involving the right popliteal     vein and one set of gastrocnemius veins in the proximal calf. 2. Remainder of the right deep venous system appears patent. 3. Good post ablation results of the right greater saphenous vein     without extension into common femoral junction. 4. Contralateral common femoral vein is patent and competent.   _____________________________ Larina Earthly, M.D.  SH/MEDQ  D:  02/24/2011  T:  02/24/2011  Job:  [id number]

## 2011-03-03 NOTE — Procedures (Signed)
LOWER EXTREMITY VENOUS REFLUX EXAM   INDICATION:  Pain and swelling of varicose veins.   EXAM:  Using color-flow imaging and pulse Doppler spectral analysis, the  right and left common femoral, superficial femoral, popliteal, posterior  tibial, greater and lesser saphenous veins are evaluated.  There is  evidence suggesting deep venous insufficiency in the right and left  lower extremities.   The right and left saphenofemoral junctions are not competent with  Reflux of >556milliseconds. The right and left GSV is not competent with  Reflux of >536milliseconds with the caliber as described below.   The right and left proximal short saphenous veins were not visualized.   GSV Diameter (used if found to be incompetent only)                                            Right            Left  Proximal Greater Saphenous Vein           0.76 cm          0.97 cm  Proximal-to-mid-thigh                     0.54 cm          0.53 cm  Mid thigh                                 0.49 cm          1.2 cm  Mid-distal thigh                          cm               cm  Distal thigh                              0.41 cm          0.71 cm  Knee                                      cm               cm   IMPRESSION:  1. Right and left greater saphenous veins are not competent with      reflux with >581milliseconds.  2. The right greater saphenous vein is not tortuous.  The left greater      saphenous vein is tortuous.  3. The deep venous system is not competent with reflux of      >58milliseconds.  4. The right and left short saphenous veins were not visualized.   ___________________________________________  Larina Earthly, M.D.   OD/MEDQ  D:  09/24/2010  T:  09/24/2010  Job:  [id number]

## 2011-03-03 NOTE — Consult Note (Signed)
NEW PATIENT CONSULTATION   Ruth Matthews, Ruth Matthews  DOB:  1966/06/12                                       09/24/2010  ZOXWR#:[id number]   The patient presents today for evaluation of lower extremity venous  pathology.  She is an otherwise healthy 45 year old female with a  progressive history of bilateral varicose veins.  She reports discomfort  bilaterally as well with swelling on both legs, more so on her left than  her right.  She reports achy sensation on both legs particularly with  prolonged standing.  She feels that this is a very heavy feeling with  prolonged standing as well.  She has no history of DVT.  No history of  prior vein surgery.  She works as a Runner, broadcasting/film/video and stands for prolonged  periods of time which makes her work difficult.   PAST HISTORY:  Negative for any major medical difficulties or symptoms.  No diabetes, hypertension, or cardiac disease.   PAST SURGICAL HISTORY:  Significant for cesarean section and  cholecystectomy.   SOCIAL HISTORY:  She is married with 2 children.  Does not smoke or  drink alcohol.   FAMILY HISTORY:  She does not have history premature atherosclerotic  disease in her family.   REVIEW OF SYSTEMS:  Positive for pain in her legs with walking and  superficial thrombophlebitis.  Review of systems otherwise negative  except for history of present illness.   PHYSICAL EXAM:  Well-developed, moderately obese white female in no  acute distress.  Blood pressure 138/90, pulse 93, respirations 18.  HEENT is normal.  Her radial pulses are 2+ bilaterally.  She has 2+  dorsalis pedis pulses bilaterally.  She does have marked tributary  varicosities in both lower extremities.  These are mostly in her  posterior calves bilaterally.  On neurologic, she has no focal weakness  or paresthesias.  On musculoskeletal, she has no major deformities or  cyanosis.  Skin:  Without ulcers or rashes.   She underwent noninvasive vascular laboratory  studies in our office and  I have reviewed this with the patient.  This shows gross reflux in her  saphenous veins bilaterally.  She does have reflux in her deep system  bilaterally with no evidence of DVT.  She does not have reflux in her  small saphenous veins.  I had a long discussion with the patient  explaining the significance of her venous pathology.  She has not worn  compression garments and we have fitted today with thigh-high graduated  compression garments, 20 to 30 mmHg.  She understands the importance of  continued elevation when possible and use of ibuprofen for discomfort as  well.  We will see her again in 3 months for continued discussion.  She  does appear to be a good candidate for laser ablation of saphenous veins  and stab phlebectomy of tributary varicosities __________ should she  fail conservative treatment.  I discussed this with the patient as well.  We will see her again for further discussion in 3 months.     Larina Earthly, M.D.  Electronically Signed   TFE/MEDQ  D:  09/24/2010  T:  09/25/2010  Job:  [id number]   cc:   Dossie Der, MD

## 2011-03-06 NOTE — H&P (Signed)
[facility]  Patient:    ESTELLE, GREENLEAF                        MRN: [id number] Adm. Date:  [id number] Attending:  Genia Del                         History and Physical  DATE OF BIRTH:                11/27/1965  HISTORY AND PHYSICAL:         The patient is a 45 year old woman, gravida 2, para 1.  Last menstrual period May 13, 2000, for an expected date of delivery, Feb 18, 2001, at 38 weeks and 5 days gestation.  REASON FOR ADMISSION:         Induction with VBAC desired, and suspicion of slightly large for gestational age.  HISTORY OF PRESENT ILLNESS:   Per last ultrasound this week, estimation of fetal weight was about 8-1/2 pounds.  Amniotic fluid was normal and cephalic presentation.  Fetal movements positive.  No regular uterine contractions, but frequent Deberah Pelton.  No vaginal bleeding.  No fluid leak.  No PIH symptoms.  The patient has gestational diabetes, class A-1, well-controlled with diet.  PAST MEDICAL HISTORY:         Negative.  PAST SURGICAL HISTORY:        Cholecystectomy in December 1994, and C-section in September 2000.  PAST OBSTETRICAL HISTORY:     September 2000, C-section for failure to progress after 5-6 cm, baby boy, 8 pounds and 15 ounces.  Had gestational diabetes controlled with diet.  PAST GYNECOLOGIC HISTORY:     Negative.  FAMILIAL HISTORY:             Positive for chronic hypertension.  MEDICATIONS:                  Prenatal vitamins.  SOCIAL HISTORY:               Married, nonsmoker.  HISTORY OF PRESENT PREGNANCY: First trimester was normal.  Labs in the first trimester showed hemoglobin 13.1, platelets 216.  Blood type AB-positive.  Rh antibody is negative.  RPR nonreactive.  HBsAg negative.  Rubella immune. Gonorrhea and Chlamydia negative.  Pap test within normal limits.  In the second trimester, the patient had a triple test within normal limits at 16+ weeks.  One hour glucose  tolerance test was abnormal at 16+ weeks.  A three hour test was declined and the patient was considered gestational diabetes mellitus.  Blood sugars were done since then, and her diabetes remained well-controlled on diet.  At 21+ weeks, ultrasound revealed anatomy was within normal limits.  Amniotic fluid was normal.  Placenta with fundic normal and the cervix was more than 4 cm and no dilatation.  An ultrasound was done at 30+ weeks showing an estimated fetal weight at 56th percentile.  Group B strep was negative at 35+ weeks.  Estimated fetal weight by ultrasound at 37+ weeks was 85th percentile, and a repeat ultrasound this week showed an estimated fetal weight around the 90th percentile.  Blood pressures remained normal until the end of the pregnancy.  Her cervix was very favorable at 3+ cm, 90% effaced, vertex -2 on the last visit this week.  REVIEW OF SYSTEMS:            Constitutional negative.  HEENT negative.  Respiratory negative.  Cardiovascular negative.  GI negative.  Urologic negative.  Dermatology negative.  Neurologic negative.  Endocrine negative.  PHYSICAL EXAMINATION:  GENERAL:                      No apparent distress on admission.  VITAL SIGNS:                  Blood pressure 124-127/76-80, pulse was 103-119, respiratory rate 18, temperature 97.6.  HEENT:                        Within normal limits.  LUNGS:                        Clear bilaterally.  HEART:                        S1 and S2 normal, no S3 and S4.  No murmur. Regular cardiac rhythm.  ABDOMEN:                      Gravid, cephalic presentation, nontender, soft.  VAGINAL EXAMINATION:          From office, 3+, 90%, vertex -2 and -1. Membranes intact.  EXTREMITIES:                  Lower limbs normal, mild edema.  No clonus.  Fetal heart rate 135-140 per minute, reactive with good accelerations, no decelerations.  No regular uterine contractions.  IMPRESSION:                   1. A gravida 2,  para 1, 38+ week gestation,                                  vaginal birth after cesarean section desired,                                  induction for slightly large for gestational                                  age per ultrasound.                               2. Gestational diabetes mellitus, class A-1,                                  well-controlled on diet.                               3. Fetal well-being reassuring.                               4. Group B streptococcus negative.  PLAN:                         Admit to labor and delivery, induction with artificial rupture of membranes and Pitocin, monitoring, close followup given VBAC.  The patient was explained the risks associated with VBAC including uterine dehiscence and rupture, failure to progress with increased risk of repeat urgent C-section, and given the slightly large for gestational age with gestational diabetes, shoulder dystocia was discussed. DD:  02/09/01 TD:  02/09/01 Job: [id number]

## 2011-03-10 ENCOUNTER — Ambulatory Visit (INDEPENDENT_AMBULATORY_CARE_PROVIDER_SITE_OTHER): Payer: BC Managed Care – PPO | Admitting: Vascular Surgery

## 2011-03-10 DIAGNOSIS — I83893 Varicose veins of bilateral lower extremities with other complications: Secondary | ICD-10-CM

## 2011-03-10 NOTE — Assessment & Plan Note (Signed)
OFFICE VISIT  Ruth Matthews, Ruth Matthews DOB:  1966/03/25                                       03/10/2011 ZOXWR#:[id number]  Patient presents today for continued discussion after her bilateral staged ablation.  She was seen 2 weeks ago where her postop duplex from her right leg ablation revealed a finding of a right popliteal thrombus. She did not have any intervention near her right popliteal vein, but I did explain this obviously was related to her procedure.  She is at the [facility] and is having regulation of this.  She was on Lovenox injections for approximately 10 days.  I did look at her vein with a SonoSite.  She does not have total occlusion of the vein.  I could not image the clot.  Symptomatically she continues to do quite well.  She will continue her Coumadin for 3 months.  We will see her again in 3 months with a formal duplex to check on resolution of her DVT.    Larina Earthly, M.D. Electronically Signed  TFE/MEDQ  D:  03/10/2011  T:  03/10/2011  Job:  [id number]

## 2011-06-09 ENCOUNTER — Encounter: Payer: Self-pay | Admitting: Vascular Surgery

## 2011-06-10 ENCOUNTER — Ambulatory Visit: Payer: BC Managed Care – PPO | Admitting: Vascular Surgery

## 2011-06-11 ENCOUNTER — Encounter: Payer: Self-pay | Admitting: Vascular Surgery

## 2011-07-08 ENCOUNTER — Ambulatory Visit: Payer: BC Managed Care – PPO | Admitting: Vascular Surgery

## 2011-07-13 ENCOUNTER — Encounter: Payer: Self-pay | Admitting: Vascular Surgery

## 2011-07-14 ENCOUNTER — Ambulatory Visit (INDEPENDENT_AMBULATORY_CARE_PROVIDER_SITE_OTHER): Payer: BC Managed Care – PPO | Admitting: *Deleted

## 2011-07-14 ENCOUNTER — Ambulatory Visit (INDEPENDENT_AMBULATORY_CARE_PROVIDER_SITE_OTHER): Payer: BC Managed Care – PPO | Admitting: Vascular Surgery

## 2011-07-14 ENCOUNTER — Encounter: Payer: Self-pay | Admitting: Vascular Surgery

## 2011-07-14 VITALS — BP 132/86 | HR 61 | Resp 16 | Ht 67.0 in | Wt 235.0 lb

## 2011-07-14 DIAGNOSIS — I999 Unspecified disorder of circulatory system: Secondary | ICD-10-CM

## 2011-07-14 DIAGNOSIS — I824Z9 Acute embolism and thrombosis of unspecified deep veins of unspecified distal lower extremity: Secondary | ICD-10-CM

## 2011-07-14 DIAGNOSIS — Z48812 Encounter for surgical aftercare following surgery on the circulatory system: Secondary | ICD-10-CM

## 2011-07-14 NOTE — Progress Notes (Signed)
The patient presents today for followup of her right popliteal nonocclusive DVT following right laser ablation of her great saphenous vein she had been started on Lovenox and conversion to Coumadin. He reports improvement in the preoperative pain that she had especially in her ankles. He has not had any significant swelling.  He underwent venous duplex today and this shows complete resolution of any DVT she does have ablation of her great saphenous vein. Discuss this at length with Ms. Holzheimer recommend discontinuing Coumadin she will see [name] again on an as-needed basis.

## 2011-07-16 NOTE — Procedures (Unsigned)
DUPLEX DEEP VENOUS EXAM - LOWER EXTREMITY  INDICATION:  Follow up right post ablation DVT.  HISTORY:  Edema:  No. Trauma/Surgery:  Right greater saphenous vein ablation, 02/18/2011. Pain:  No. PE:  No. Previous DVT:  Right popliteal DVT following ablation. Anticoagulants:  Currently taking Coumadin. Other:  DUPLEX EXAM:               CFV   SFV   PopV  PTV    GSV               R  L  R  L  R  L  R   L  R  L Thrombosis    o  o  o     o     o      + Spontaneous   +  +  +     +     +      0 Phasic        +  +  +     +     +      0 Augmentation  +  +  +     +     +      0 Compressible  +  +  +     +     +      0 Competent     0  +  0     0     +      +  Legend:  + - yes  o - no  p - partial  D - decreased  IMPRESSION: 1. No evidence of right lower extremity deep venous thrombosis. 2. Deep vein insufficiency noted in the right lower extremity.   _____________________________ Larina Earthly, M.D.  EM/MEDQ  D:  07/14/2011  T:  07/14/2011  Job:  [id number]

## 2011-08-03 ENCOUNTER — Other Ambulatory Visit: Payer: Self-pay | Admitting: Family Medicine

## 2013-12-11 ENCOUNTER — Encounter: Payer: Self-pay | Admitting: Cardiology

## 2013-12-11 DIAGNOSIS — I10 Essential (primary) hypertension: Secondary | ICD-10-CM | POA: Insufficient documentation

## 2013-12-11 DIAGNOSIS — I4729 Other ventricular tachycardia: Secondary | ICD-10-CM

## 2013-12-11 DIAGNOSIS — R5383 Other fatigue: Secondary | ICD-10-CM | POA: Insufficient documentation

## 2013-12-11 DIAGNOSIS — R5381 Other malaise: Secondary | ICD-10-CM | POA: Insufficient documentation

## 2013-12-11 DIAGNOSIS — I472 Ventricular tachycardia: Secondary | ICD-10-CM | POA: Insufficient documentation

## 2013-12-11 DIAGNOSIS — R609 Edema, unspecified: Secondary | ICD-10-CM | POA: Insufficient documentation

## 2013-12-11 DIAGNOSIS — E785 Hyperlipidemia, unspecified: Secondary | ICD-10-CM | POA: Insufficient documentation

## 2013-12-15 ENCOUNTER — Telehealth: Payer: Self-pay

## 2013-12-15 NOTE — Telephone Encounter (Signed)
This is a skains pt not Turner

## 2013-12-18 ENCOUNTER — Telehealth: Payer: Self-pay | Admitting: *Deleted

## 2013-12-18 NOTE — Telephone Encounter (Signed)
Patient requests verapamil refill to be [facility] to [facility] [place]. She would like a 90 day supply and requests that if possible this be taken care of before Wednesday as she will be out of town Thursday. Thanks, MI

## 2013-12-19 ENCOUNTER — Other Ambulatory Visit: Payer: Self-pay | Admitting: Cardiology

## 2013-12-19 MED ORDER — VERAPAMIL HCL ER 240 MG PO TBCR: 240.0000 mg | EXTENDED_RELEASE_TABLET | Freq: Every day | ORAL | Status: DC

## 2013-12-19 NOTE — Telephone Encounter (Signed)
Rx refilled.

## 2013-12-26 ENCOUNTER — Other Ambulatory Visit: Payer: Self-pay | Admitting: Cardiology

## 2013-12-26 MED ORDER — VERAPAMIL HCL ER 240 MG PO TBCR: 240.0000 mg | EXTENDED_RELEASE_TABLET | Freq: Every day | ORAL | Status: DC

## 2014-02-28 ENCOUNTER — Telehealth: Payer: Self-pay | Admitting: Cardiology

## 2014-02-28 NOTE — Telephone Encounter (Signed)
Called requesting an appointment with Dr. Anne FuSkains.  States she went on a long trip recently and has some problems since her return. States she has had pain in (L) shoulder that radiates into chest.  This discomfort is persistent and is there all the time.  She can not relate it to anything she does except is worse when she is tired.  Also had some swelling in both legs while on her trip.  The swelling is better but she feels the aching in her calf and muscle fatigue like she had when she had the blood clots in 2012.  States her legs cramp but no edema or redness noted now. Dr. Anne FuSkains had an available appointment for this Fri 5/15 so she will come in at that time.

## 2014-02-28 NOTE — Telephone Encounter (Signed)
New Message:  Pt is c/o muscle fatigue and pain in her left shoulder... Wants to be worked in to see Dr. Anne FuSkains. Did not want to wait for his next appt in June.

## 2014-03-02 ENCOUNTER — Encounter: Payer: Self-pay | Admitting: Cardiology

## 2014-03-02 ENCOUNTER — Ambulatory Visit (INDEPENDENT_AMBULATORY_CARE_PROVIDER_SITE_OTHER): Payer: BC Managed Care – PPO | Admitting: Cardiology

## 2014-03-02 VITALS — BP 154/92 | HR 93 | Ht 67.0 in | Wt 259.0 lb

## 2014-03-02 DIAGNOSIS — R0789 Other chest pain: Secondary | ICD-10-CM

## 2014-03-02 DIAGNOSIS — F419 Anxiety disorder, unspecified: Secondary | ICD-10-CM

## 2014-03-02 DIAGNOSIS — F411 Generalized anxiety disorder: Secondary | ICD-10-CM

## 2014-03-02 LAB — CBC WITH DIFFERENTIAL/PLATELET
Basophils Absolute: 0 10*3/uL (ref 0.0–0.1)
Basophils Relative: 0.5 % (ref 0.0–3.0)
Eosinophils Absolute: 0.2 10*3/uL (ref 0.0–0.7)
Eosinophils Relative: 2.2 % (ref 0.0–5.0)
HCT: 37.8 % (ref 36.0–46.0)
Hemoglobin: 12.4 g/dL (ref 12.0–15.0)
Lymphocytes Relative: 18.7 % (ref 12.0–46.0)
Lymphs Abs: 1.3 10*3/uL (ref 0.7–4.0)
MCHC: 32.8 g/dL (ref 30.0–36.0)
MCV: 81.4 fl (ref 78.0–100.0)
Monocytes Absolute: 0.6 10*3/uL (ref 0.1–1.0)
Monocytes Relative: 8.8 % (ref 3.0–12.0)
Neutro Abs: 4.9 10*3/uL (ref 1.4–7.7)
Neutrophils Relative %: 69.8 % (ref 43.0–77.0)
Platelets: 242 10*3/uL (ref 150.0–400.0)
RBC: 4.65 Mil/uL (ref 3.87–5.11)
RDW: 16 % — ABNORMAL HIGH (ref 11.5–15.5)
WBC: 7 10*3/uL (ref 4.0–10.5)

## 2014-03-02 LAB — BASIC METABOLIC PANEL
BUN: 9 mg/dL (ref 6–23)
CO2: 26 mEq/L (ref 19–32)
Calcium: 8.9 mg/dL (ref 8.4–10.5)
Chloride: 106 mEq/L (ref 96–112)
Creatinine, Ser: 0.7 mg/dL (ref 0.4–1.2)
GFR: 102.38 mL/min (ref >60.00)
Glucose, Bld: 87 mg/dL (ref 70–99)
Potassium: 4 mEq/L (ref 3.5–5.1)
Sodium: 138 mEq/L (ref 135–145)

## 2014-03-02 LAB — TSH: TSH: 1.04 u[IU]/mL (ref 0.35–4.50)

## 2014-03-02 MED ORDER — VERAPAMIL HCL ER 240 MG PO TBCR: 240.0000 mg | EXTENDED_RELEASE_TABLET | Freq: Every day | ORAL | Status: DC

## 2014-03-02 NOTE — Progress Notes (Signed)
[address] [place], [place]  [address] Phone: [phone] Fax:  [phone]  Date:  03/02/2014   ID:  Ruth Matthews, DOB 1965-12-09, MRN [id number]  PCP:  Gaye AlkenBARNES,ELIZABETH STEWART, MD   History of Present Illness: Ruth Matthews is a 48 y.o. Matthews with ventricular tachycardia idiopathic left fascicular VT seen by Dr. Sherryl MangesSteven Klein of electrophysiology who was hospitalized early January 2011 because of her ventricular tachycardia. 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 She probably went to an urgent care center who called EMS who performed an EKG on her which showed a wide-complex tachycardia with negatively deflecting QRS complexes in the lateral leads as well as right axis deviation and negative deflection in 23 aVF. She was given an IV and amiodarone. She converted after about 6 minutes. Briefly she was in atrial fibrillation postconversion and then remained in sinus rhythm for the hospitalization. Dr. Graciela HusbandsKlein recommended verapamil for treatment of this left fascicular VT. Cardiac MRI was unremarkable. Echocardiogram unremarkable. Cardiac catheterization unremarkable, no CAD.   Had varicose vein removal and devolped DVT. now off of Coumadin.    On 10/02/11 we answered several questions. Overall she is doing well. She did have a question about if her children should be screened. I said that it would not be unreasonable for her to get EKGs on her children. I also believe he would be helpful to seek counsel of Dr. Graciela HusbandsKlein of electrophysiology to obtain his opinion. After speaking with Dr. Graciela HusbandsKlein on 10/06/11, he did not believe that there was any further screening that needed to be done for the children. I left a message on her telephone stating this.   03/31/12 - Has ringing in her ear at night from Verapamil she says. 3 miles of walking a day. Consistent, 5 days a week. Active [employer]. Feels fatigued in the summer. Was  concerned about DVT because of pain behind knee, thought she felt something. Now improved. No edema/redness. Prior DVT after varicose vein removal. She had many topic to discuss, anxious with questions. CP-feels better when rubbing chest wall, non exertional. NO SYNCOPE.  06/30/13-has had a really rough year. Her husband lost his job as a Char[employer] fundraiserchemist. 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 She's been under increased stress. She was teary eyed for the visit. She has gained about 50 pounds. She's been experiencing more swelling in her legs and fatigue.  03/02/14 - when on long trip. Had marked edema following travels. Felt very tired, slept for 2 days. Legs have decreased in swelling currently. Occasional cramping in calves. Generalized feeling of uneasiness. She has a sensation like she felt when she had her "blood clot". Her legs do not hurt nor is she short of breath at rest. Has had severe allergies recently. No palpitations.    Wt Readings from Last 3 Encounters:  03/02/14 259 lb (117.482 kg)  07/14/11 235 lb (106.595 kg)     Past Medical History  Diagnosis Date  . Varicose veins   . DVT (deep venous thrombosis)   . Ventricular tachycardia   . Vitamin D deficiency   . Hypertension   . RMSF [facility]([place] spotted fever) 2004    history of   . Gestational diabetes 2000  . 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 Anne FuSkains, Dr. Graciela HusbandsKlein  . Elevated fasting blood sugar  Past Surgical History  Procedure Laterality Date  . Cholecystectomy    . Cesarean section    . Endovenous ablation saphenous vein w/ laser  02/2011  . Cardiac catheterization      no CAD    Current Outpatient Prescriptions  Medication Sig Dispense Refill  . verapamil (CALAN-SR) 240 MG CR tablet Take 1 tablet (240 mg total) by mouth daily.  30 tablet  5   No current facility-administered medications for this visit.    Allergies:    Allergies  Allergen Reactions  . Azithromycin       Upset stomach     Social History:  The patient  reports that she has never smoked. She has never used smokeless tobacco. She reports that she does not drink alcohol or use illicit drugs.   Family History  Problem Relation Age of Onset  . Hypertension Mother   . COPD Mother   . Other Father     died from accident, h/o lyme disease    ROS:  Please see the history of present illness.   Multiple somatic symptoms. No current chest pain, shortness of breath.  All other systems reviewed and negative.   PHYSICAL EXAM: VS:  BP 154/92  Pulse 93  Ht 5\' 7"  (1.702 m)  Wt 259 lb (117.482 kg)  BMI 40.56 kg/m2 Well nourished, well developed, in no acute distress HEENT: normal, [place]/AT, EOMI Neck: no JVD, normal carotid upstroke, no bruit Cardiac:  normal S1, S2; RRR; no murmur Lungs:  clear to auscultation bilaterally, no wheezing, rhonchi or rales Abd: soft, nontender, no hepatomegaly, no bruitsOverweight Ext: no edema, 2+ distal pulses Skin: warm and dry GU: deferred Neuro: no focal abnormalities noted, AAO x 3  EKG:  03/02/14 - sinus rhythm, 93, no other abnormalities. Normal QT.   ASSESSMENT AND PLAN:  1. Atypical chest pain - ETT. Constant left sided pain - ?MSK. Pain that occurred when walking in central chest of unknown etiology. She does not appear to be in any distress currently. She does get quite emotional when talking about how she has felt since March. Continue with verapamil during treadmill. If we do not reach target heart rate this is OK. Will check TSH, CBC, BMET.  2. Morbid obesity-continue to encourage weight loss.  3. Vascular ultrasound from 07/14/11 by Dr. early showed no evidence of DVT. My suspicion currently for DVT is quite low given the resolution of her symptoms of lower extremity edema, pain. My suspicion for pulmonary embolism is also quite low and she is in no distress, normal respiratory effort, no active chest pain. It is been over 2 months since her travels. I  explained to her the sensitivity/specificity of d-dimer and how it can be elevated in a multitude of conditions including obesity, allergies and the implications of potential positive d-dimer such as ultrasounds, CT scans. Since my suspicion is quite low for active thrombosis I do not wish to pursue this test. Of course, if her symptoms were to progress, she is to seek medical attention. 4. History of ventricular tachycardia-stable on verapamil. 5. 6 month follow up.  Signed, Donato SchultzMark Amisha Pospisil, MD [facility]  03/02/2014 2:00 PM

## 2014-03-02 NOTE — Patient Instructions (Addendum)
Your physician recommends that you continue on your current medications as directed. Please refer to the Current Medication list given to you today.  Your physician wants you to follow-up in: 6 month ov You will receive a reminder letter in the mail two months in advance. If you don't receive a letter, please call our office to schedule the follow-up appointment.   Your physician has requested that you have an exercise tolerance test. For further information please visit [email or web address]. Please also follow instruction sheet, as given.  Will obtain labs today and call you with the results (bmet/tsh/cbc)

## 2014-03-13 ENCOUNTER — Telehealth (HOSPITAL_COMMUNITY): Payer: Self-pay

## 2014-03-19 ENCOUNTER — Telehealth (HOSPITAL_COMMUNITY): Payer: Self-pay

## 2014-03-20 ENCOUNTER — Ambulatory Visit (HOSPITAL_COMMUNITY)
Admission: RE | Admit: 2014-03-20 | Discharge: 2014-03-20 | Disposition: A | Discharge status: Home / Self Care | Payer: BC Managed Care – PPO | Source: Ambulatory Visit | Attending: Internal Medicine | Admitting: Internal Medicine

## 2014-03-20 DIAGNOSIS — R0789 Other chest pain: Secondary | ICD-10-CM | POA: Insufficient documentation

## 2014-04-10 ENCOUNTER — Encounter (HOSPITAL_COMMUNITY): Payer: BC Managed Care – PPO

## 2014-04-11 ENCOUNTER — Telehealth: Payer: Self-pay | Admitting: *Deleted

## 2014-04-11 NOTE — Telephone Encounter (Signed)
Left message to call back   Notes Recorded by Donato SchultzMark Skains, MD on 03/22/2014 at 1:43 PM MD note, final report not present. She did well. Low risk Stress test.

## 2014-04-12 NOTE — Telephone Encounter (Signed)
Left message to call back  

## 2014-04-12 NOTE — Telephone Encounter (Signed)
F/u   Pt returning call concerning stress test

## 2014-04-13 NOTE — Telephone Encounter (Signed)
Follow Up ° ° ° ° °Pt calling following up on test results. Please call. °

## 2014-04-13 NOTE — Telephone Encounter (Signed)
Left message of results for pt  

## 2014-04-17 ENCOUNTER — Encounter (HOSPITAL_COMMUNITY): Payer: BC Managed Care – PPO

## 2014-04-18 NOTE — Telephone Encounter (Signed)
Encounter complete. 

## 2014-04-24 ENCOUNTER — Encounter: Payer: BC Managed Care – PPO | Admitting: Nurse Practitioner

## 2014-04-26 NOTE — Telephone Encounter (Signed)
Encounter complete. 

## 2014-05-17 ENCOUNTER — Encounter (HOSPITAL_BASED_OUTPATIENT_CLINIC_OR_DEPARTMENT_OTHER): Payer: Self-pay | Admitting: Emergency Medicine

## 2014-05-17 ENCOUNTER — Emergency Department (HOSPITAL_BASED_OUTPATIENT_CLINIC_OR_DEPARTMENT_OTHER)
Admission: EM | Admit: 2014-05-17 | Discharge: 2014-05-17 | Disposition: A | Discharge status: Home / Self Care | Payer: BC Managed Care – PPO | Attending: Emergency Medicine | Admitting: Emergency Medicine

## 2014-05-17 DIAGNOSIS — Z79899 Other long term (current) drug therapy: Secondary | ICD-10-CM | POA: Insufficient documentation

## 2014-05-17 DIAGNOSIS — M79609 Pain in unspecified limb: Secondary | ICD-10-CM | POA: Diagnosis not present

## 2014-05-17 DIAGNOSIS — M7989 Other specified soft tissue disorders: Secondary | ICD-10-CM | POA: Diagnosis not present

## 2014-05-17 DIAGNOSIS — Z9889 Other specified postprocedural states: Secondary | ICD-10-CM | POA: Diagnosis not present

## 2014-05-17 DIAGNOSIS — I10 Essential (primary) hypertension: Secondary | ICD-10-CM | POA: Diagnosis not present

## 2014-05-17 DIAGNOSIS — Z8632 Personal history of gestational diabetes: Secondary | ICD-10-CM | POA: Insufficient documentation

## 2014-05-17 DIAGNOSIS — Z86718 Personal history of other venous thrombosis and embolism: Secondary | ICD-10-CM | POA: Insufficient documentation

## 2014-05-17 DIAGNOSIS — I739 Peripheral vascular disease, unspecified: Secondary | ICD-10-CM | POA: Insufficient documentation

## 2014-05-17 DIAGNOSIS — Z8619 Personal history of other infectious and parasitic diseases: Secondary | ICD-10-CM | POA: Insufficient documentation

## 2014-05-17 DIAGNOSIS — Z862 Personal history of diseases of the blood and blood-forming organs and certain disorders involving the immune mechanism: Secondary | ICD-10-CM | POA: Diagnosis not present

## 2014-05-17 DIAGNOSIS — Z8639 Personal history of other endocrine, nutritional and metabolic disease: Secondary | ICD-10-CM | POA: Insufficient documentation

## 2014-05-17 DIAGNOSIS — M79605 Pain in left leg: Secondary | ICD-10-CM

## 2014-05-17 NOTE — ED Notes (Signed)
Was sent from her Md's office with c.o pain her left calf to r/o DVT.

## 2014-05-17 NOTE — ED Provider Notes (Signed)
CSN: [id number]     Arrival date & time 05/17/14  1853 History   First MD Initiated Contact with Patient 05/17/14 1925     Chief Complaint  Patient presents with  . Claudication     (Consider location/radiation/quality/duration/timing/severity/associated sxs/prior Treatment) HPI [age] are old female sent here from her primary care office for ultrasound of her left lower extremity. She states that she has had some swelling and discomfort in the left For 4-5 days. She went in to be evaluated for this today. She has a history of DVT in her right calf that was treated 2 years ago. She was on Coumadin which has since been discontinued. She does not have any recent immobilization or travel. She has not had any known trauma. She denies any chest pain or shortness of breath. Past Medical History  Diagnosis Date  . Varicose veins   . DVT (deep venous thrombosis)   . Ventricular tachycardia   . Vitamin D deficiency   . Hypertension   . RMSF [facility]([place] spotted fever) 2004    history of   . Gestational diabetes 2000  . 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 Anne FuSkains, Dr. Graciela HusbandsKlein  . Elevated fasting blood sugar    Past Surgical History  Procedure Laterality Date  . Cholecystectomy    . Cesarean section    . Endovenous ablation saphenous vein w/ laser  02/2011  . Cardiac catheterization      no CAD   Family History  Problem Relation Age of Onset  . Hypertension Mother   . COPD Mother   . Other Father     died from accident, h/o lyme disease   History  Substance Use Topics  . Smoking status: Never Smoker   . Smokeless tobacco: Never Used  . Alcohol Use: No   OB History   Grav Para Term Preterm Abortions TAB SAB Ect Mult Living                 Review of Systems  All other systems reviewed and are negative.     Allergies  Azithromycin  Home Medications   Prior to Admission medications   Medication Sig Start Date End Date Taking? Authorizing Provider   verapamil (CALAN-SR) 240 MG CR tablet Take 1 tablet (240 mg total) by mouth daily. 03/02/14   Donato SchultzMark Skains, MD   BP 149/77  Pulse 85  Temp(Src) 98.2 F (36.8 C) (Oral)  Resp 20  Ht 5\' 7"  (1.702 m)  Wt 259 lb (117.482 kg)  BMI 40.56 kg/m2  SpO2 100%  LMP 04/25/2014 Physical Exam  Nursing note and vitals reviewed. Constitutional: She is oriented to person, place, and time. She appears well-developed and well-nourished.  HENT:  Head: Normocephalic and atraumatic.  Right Ear: External ear normal.  Left Ear: External ear normal.  Nose: Nose normal.  Mouth/Throat: Oropharynx is clear and moist.  Eyes: Conjunctivae and EOM are normal. Pupils are equal, round, and reactive to light.  Neck: Normal range of motion. Neck supple.  Cardiovascular: Normal rate, regular rhythm, normal heart sounds and intact distal pulses.   Pulmonary/Chest: Effort normal and breath sounds normal.  Abdominal: Soft. Bowel sounds are normal.  Musculoskeletal: Normal range of motion. She exhibits tenderness.  Left calf with some warmth and tenderness to palpation posteriorly there is no tenderness medially along the thigh. Positive Homans sign.  Neurological: She is alert and oriented to person, place, and time. She has normal reflexes.  Skin: Skin is  warm and dry.  Psychiatric: She has a normal mood and affect. Her behavior is normal. Judgment and thought content normal.    ED Course  Procedures (including critical care time) Labs Review Labs Reviewed - No data to display  Imaging Review No results found.   EKG Interpretation None      MDM   Final diagnoses:  Pain and swelling of lower extremity, left   Patient's well as criteria for DVT is 30 and she is high risk for DVT. I have offered her transfer to [facility] for ultrasound. I have also offered her anticoagulation with Lovenox and ultrasound in the morning. She refuses Lovenox but agrees to return for ultrasound in morning. She voices  understanding of the risk of complications such as pulmonary embolism. I have discussed her that she should return immediately if she has any chest pain or shortness of breath. She has not had any chest symptoms. She understands that she should return or she should has any symptoms. She'll return for ultrasound in the morning. She will wait for results. She is followed by the physicians and understands that she will check him again in morning if this test is positive.    Hilario Quarry, MD 05/17/14 2009

## 2014-05-17 NOTE — Discharge Instructions (Signed)
Return in the morning for ultrasound of leg.  Please return immediately if you have any difficulty breathing or chest pain.   Deep Vein Thrombosis A deep vein thrombosis (DVT) is a blood clot that develops in the deep, larger veins of the leg, arm, or pelvis. These are more dangerous than clots that might form in veins near the surface of the body. A DVT can lead to serious and even life-threatening complications if the clot breaks off and travels in the bloodstream to the lungs.  A DVT can damage the valves in your leg veins so that instead of flowing upward, the blood pools in the lower leg. This is called post-thrombotic syndrome, and it can result in pain, swelling, discoloration, and sores on the leg. CAUSES Usually, several things contribute to the formation of blood clots. Contributing factors include:  The flow of blood slows down.  The inside of the vein is damaged in some way.  You have a condition that makes blood clot more easily. RISK FACTORS Some people are more likely than others to develop blood clots. Risk factors include:   Smoking.  Being overweight (obese).  Sitting or lying still for a long time. This includes long-distance travel, paralysis, or recovery from an illness or surgery. Other factors that increase risk are:   Older age, especially over 62 years of age.  Having a family history of blood clots or if you have already had a blot clot.  Having major or lengthy surgery. This is especially true for surgery on the hip, knee, or belly (abdomen). Hip surgery is particularly high risk.  Having a long, thin tube (catheter) placed inside a vein during a medical procedure.  Breaking a hip or leg.  Having cancer or cancer treatment.  Pregnancy and childbirth.  Hormone changes make the blood clot more easily during pregnancy.  The fetus puts pressure on the veins of the pelvis.  There is a risk of injury to veins during delivery or a caesarean delivery. The  risk is highest just after childbirth.  Medicines containing the female hormone estrogen. This includes birth control pills and hormone replacement therapy.  Other circulation or heart problems.  SIGNS AND SYMPTOMS When a clot forms, it can either partially or totally block the blood flow in that vein. Symptoms of a DVT can include:  Swelling of the leg or arm, especially if one side is much worse.  Warmth and redness of the leg or arm, especially if one side is much worse.  Pain in an arm or leg. If the clot is in the leg, symptoms may be more noticeable or worse when standing or walking. The symptoms of a DVT that has traveled to the lungs (pulmonary embolism, PE) usually start suddenly and include:  Shortness of breath.  Coughing.  Coughing up blood or blood-tinged mucus.  Chest pain. The chest pain is often worse with deep breaths.  Rapid heartbeat. Anyone with these symptoms should get emergency medical treatment right away. Do not wait to see if the symptoms will go away. Call your local emergency services (911 in the [place] if you have these symptoms. Do not drive yourself to the hospital. DIAGNOSIS If a DVT is suspected, your health care provider will take a full medical history and perform a physical exam. Tests that also may be required include:  Blood tests, including studies of the clotting properties of the blood.  Ultrasound to see if you have clots in your legs or lungs.  X-rays  to show the flow of blood when dye is injected into the veins (venogram).  Studies of your lungs if you have any chest symptoms. PREVENTION  Exercise the legs regularly. Take a brisk 30-minute walk every day.  Maintain a weight that is appropriate for your height.  Avoid sitting or lying in bed for long periods of time without moving your legs.  Women, particularly those over the age of 35 years, should consider the risks and benefits of taking estrogen medicines, including birth  control pills.  Do not smoke, especially if you take estrogen medicines.  Long-distance travel can increase your risk of DVT. You should exercise your legs by walking or pumping the muscles every hour.  Many of the risk factors above relate to situations that exist with hospitalization, either for illness, injury, or elective surgery. Prevention may include medical and nonmedical measures.  Your health care provider will assess you for the need for venous thromboembolism prevention when you are admitted to the hospital. If you are having surgery, your surgeon will assess you the day of or day after surgery. TREATMENT Once identified, a DVT can be treated. It can also be prevented in some circumstances. Once you have had a DVT, you may be at increased risk for a DVT in the future. The most common treatment for DVT is blood-thinning (anticoagulant) medicine, which reduces the blood's tendency to clot. Anticoagulants can stop new blood clots from forming and stop old clots from growing. They cannot dissolve existing clots. Your body does this by itself over time. Anticoagulants can be given by mouth, through an IV tube, or by injection. Your health care provider will determine the best program for you. Other medicines or treatments that may be used are:  Heparin or related medicines (low molecular weight heparin) are often the first treatment for a blood clot. They act quickly. However, they cannot be taken orally and must be given either in shot form or by IV tube.  Heparin can cause a fall in a component of blood that stops bleeding and forms blood clots (platelets). You will be monitored with blood tests to be sure this does not occur.  Warfarin is an anticoagulant that can be swallowed. It takes a few days to start working, so usually heparin or related medicines are used in combination. Once warfarin is working, heparin is usually stopped.  Factor Xa inhibitor medicines, such as rivaroxaban and  apixaban, also reduce blood clotting. These medicines are taken orally and can often be used without heparin or related medicines.  Less commonly, clot dissolving drugs (thrombolytics) are used to dissolve a DVT. They carry a high risk of bleeding, so they are used mainly in severe cases where your life or a part of your body is threatened.  Very rarely, a blood clot in the leg needs to be removed surgically.  If you are unable to take anticoagulants, your health care provider may arrange for you to have a filter placed in a main vein in your abdomen. This filter prevents clots from traveling to your lungs. HOME CARE INSTRUCTIONS  Take all medicines as directed by your health care provider.  Learn as much as you can about DVT.  Wear a medical alert bracelet or carry a medical alert card.  Ask your health care provider how soon you can go back to normal activities. It is important to stay active to prevent blood clots. If you are on anticoagulant medicine, avoid contact sports.  It is very important  to exercise. This is especially important while traveling, sitting, or standing for long periods of time. Exercise your legs by walking or by tightening and relaxing your leg muscles regularly. Take frequent walks.  You may need to wear compression stockings. These are tight elastic stockings that apply pressure to the lower legs. This pressure can help keep the blood in the legs from clotting. Taking Warfarin Warfarin is a daily medicine that is taken by mouth. Your health care provider will advise you on the length of treatment (usually 3-6 months, sometimes lifelong). If you take warfarin:  Understand how to take warfarin and foods that can affect how warfarin works in Public relations account executiveyour body.  Too much and too little warfarin are both dangerous. Too much warfarin increases the risk of bleeding. Too little warfarin continues to allow the risk for blood clots. Warfarin and Regular Blood Testing While taking  warfarin, you will need to have regular blood tests to measure your blood clotting time. These blood tests usually include both the prothrombin time (PT) and international normalized ratio (INR) tests. The PT and INR results allow your health care provider to adjust your dose of warfarin. It is very important that you have your PT and INR tested as often as directed by your health care provider.  Warfarin and Your Diet Avoid major changes in your diet, or notify your health care provider before changing your diet. Arrange a visit with a registered dietitian to answer your questions. Many foods, especially foods high in vitamin K, can interfere with warfarin and affect the PT and INR results. You should eat a consistent amount of foods high in vitamin K. Foods high in vitamin K include:   Spinach, kale, broccoli, cabbage, collard and turnip greens, Brussels sprouts, peas, cauliflower, seaweed, and parsley.  Beef and pork liver.  Green tea.  Soybean oil. Warfarin with Other Medicines Many medicines can interfere with warfarin and affect the PT and INR results. You must:  Tell your health care provider about any and all medicines, vitamins, and supplements you take, including aspirin and other over-the-counter anti-inflammatory medicines. Be especially cautious with aspirin and anti-inflammatory medicines. Ask your health care provider before taking these.  Do not take or discontinue any prescribed or over-the-counter medicine except on the advice of your health care provider or pharmacist. Warfarin Side Effects Warfarin can have side effects, such as easy bruising and difficulty stopping bleeding. Ask your health care provider or pharmacist about other side effects of warfarin. You will need to:  Hold pressure over cuts for longer than usual.  Notify your dentist and other health care providers that you are taking warfarin before you undergo any procedures where bleeding may occur. Warfarin  with Alcohol and Tobacco   Drinking alcohol frequently can increase the effect of warfarin, leading to excess bleeding. It is best to avoid alcoholic drinks or to consume only very small amounts while taking warfarin. Notify your health care provider if you change your alcohol intake.   Do not use any tobacco products including cigarettes, chewing tobacco, or electronic cigarettes. If you smoke, quit. Ask your health care provider for help with quitting smoking. Alternative Medicines to Warfarin: Factor Xa Inhibitor Medicines  These blood-thinning medicines are taken by mouth, usually for several weeks or longer. It is important to take the medicine every single day at the same time each day.  There are no regular blood tests required when using these medicines.  There are fewer food and drug interactions than with  warfarin.  The side effects of this class of medicine are similar to those of warfarin, including excessive bruising or bleeding. Ask your health care provider or pharmacist about other potential side effects. SEEK MEDICAL CARE IF:  You notice a rapid heartbeat.  You feel weaker or more tired than usual.  You feel faint.  You notice increased bruising.  You feel your symptoms are not getting better in the time expected.  You believe you are having side effects of medicine. SEEK IMMEDIATE MEDICAL CARE IF:  You have chest pain.  You have trouble breathing.  You have new or increased swelling or pain in one leg.  You cough up blood.  You notice blood in vomit, in a bowel movement, or in urine. MAKE SURE YOU:  Understand these instructions.  Will watch your condition.  Will get help right away if you are not doing well or get worse. Document Released: 10/05/2005 Document Revised: 02/19/2014 Document Reviewed: 06/12/2013 [facility] Patient Information 2015 [place], [place]. This information is not intended to replace advice given to you by your health care provider.  Make sure you discuss any questions you have with your health care provider.

## 2014-05-17 NOTE — ED Notes (Signed)
Pain to left call x 2 -4 days  Slight swelling  Denies inj,  Set here by her md for us

## 2014-05-18 ENCOUNTER — Ambulatory Visit (HOSPITAL_BASED_OUTPATIENT_CLINIC_OR_DEPARTMENT_OTHER)
Admit: 2014-05-18 | Discharge: 2014-05-18 | Disposition: A | Discharge status: Home / Self Care | Payer: BC Managed Care – PPO | Source: Ambulatory Visit | Attending: Emergency Medicine | Admitting: Emergency Medicine

## 2014-05-18 DIAGNOSIS — I8000 Phlebitis and thrombophlebitis of superficial vessels of unspecified lower extremity: Secondary | ICD-10-CM | POA: Insufficient documentation

## 2014-05-18 DIAGNOSIS — M7989 Other specified soft tissue disorders: Secondary | ICD-10-CM | POA: Diagnosis present

## 2014-05-18 DIAGNOSIS — M79609 Pain in unspecified limb: Secondary | ICD-10-CM | POA: Diagnosis present

## 2014-05-18 NOTE — ED Provider Notes (Signed)
Pt returns for doppler u/s of her left leg.  This was negative for DVT, +evidence of superficial thrombophlebitis present.  I talked with pt, advised warm compresses, ibuprofen for symptomatic relief.  Advised pt that if her symptoms continue, she needs to f/u with her PMD to arrange a repeat u/s in one week.  Rolan BuccoMelanie Yasamin Karel, MD 05/18/14 1045

## 2014-06-29 ENCOUNTER — Ambulatory Visit: Payer: BC Managed Care – PPO | Admitting: Cardiology

## 2015-02-12 ENCOUNTER — Other Ambulatory Visit: Payer: Self-pay | Admitting: Cardiology

## 2015-03-13 ENCOUNTER — Other Ambulatory Visit: Payer: Self-pay | Admitting: Cardiology

## 2015-03-29 ENCOUNTER — Encounter: Payer: Self-pay | Admitting: Cardiology

## 2015-03-29 ENCOUNTER — Ambulatory Visit (INDEPENDENT_AMBULATORY_CARE_PROVIDER_SITE_OTHER): Payer: BC Managed Care – PPO | Admitting: Cardiology

## 2015-03-29 VITALS — BP 128/88 | HR 94 | Ht 67.0 in | Wt 262.0 lb

## 2015-03-29 DIAGNOSIS — I10 Essential (primary) hypertension: Secondary | ICD-10-CM

## 2015-03-29 DIAGNOSIS — I8290 Acute embolism and thrombosis of unspecified vein: Secondary | ICD-10-CM

## 2015-03-29 DIAGNOSIS — I472 Ventricular tachycardia: Secondary | ICD-10-CM

## 2015-03-29 DIAGNOSIS — I4729 Other ventricular tachycardia: Secondary | ICD-10-CM

## 2015-03-29 NOTE — Patient Instructions (Addendum)
Medication Instructions:  Your physician recommends that you continue on your current medications as directed. Please refer to the Current Medication list given to you today.   Labwork: None ordered  Testing/Procedures: You have been referred back to Dr. Arbie Cookey ---last seen 2012 for DVT  You have been referred to Hematology ---to discuss clotting disorders   Follow-Up: Your physician wants you to follow-up in: 12 months with Dr Algis Liming will receive a reminder letter in the mail two months in advance. If you don't receive a letter, please call our office to schedule the follow-up appointment.   Any Other Special Instructions Will Be Listed Below (If Applicable).

## 2015-03-29 NOTE — Progress Notes (Signed)
[address] [place], [place]  [address] Phone: [phone] Fax:  [phone]  Date:  03/29/2015   ID:  MYKEL ZWICKY, DOB 01/07/1966, MRN [id number]  PCP:  Ruth Alken, MD   History of Present Illness: Ruth Matthews is a 49 y.o. female with ventricular tachycardia idiopathic left fascicular VT seen by Dr. Sherryl Matthews of electrophysiology who was hospitalized early January 2011 because of her ventricular tachycardia. 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 She probably went to an urgent care center who called EMS who performed an EKG on her which showed a wide-complex tachycardia with negatively deflecting QRS complexes in the lateral leads as well as right axis deviation and negative deflection in 23 aVF. She was given an IV and amiodarone. She converted after about 6 minutes. Briefly she was in atrial fibrillation postconversion and then remained in sinus rhythm for the hospitalization. Dr. Graciela Matthews recommended verapamil for treatment of this left fascicular VT. Cardiac MRI was unremarkable. Echocardiogram unremarkable. Cardiac catheterization unremarkable, no CAD.   Had varicose vein removal and devolped DVT. now off of Coumadin.    On 10/02/11 we answered several questions. Overall she is doing well. She did have a question about if her children should be screened. I said that it would not be unreasonable for her to get EKGs on her children. I also believe he would be helpful to seek counsel of Dr. Graciela Matthews of electrophysiology to obtain his opinion. After speaking with Dr. Graciela Matthews on 10/06/11, he did not believe that there was any further screening that needed to be done for the children. I left a message on her telephone stating this.   03/31/12 - Has ringing in her ear at night from Verapamil she says. 3 miles of walking a day. Consistent, 5 days a week. Active link [employer]. Feels fatigued in the summer. Was  concerned about DVT because of pain behind knee, thought she felt something. Now improved. No edema/redness. Prior DVT after varicose vein removal. She had many topic to discuss, anxious with questions. CP-feels better when rubbing chest wall, non exertional. NO SYNCOPE.  06/30/13-has had a really rough year. Her husband lost his job as a Charity fundraiser. 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 She's been under increased stress. She was teary eyed for the visit. She has gained about 50 pounds. She's been experiencing more swelling in her legs and fatigue.  03/02/14 - when on long trip. Had marked edema following travels. Felt very tired, slept for 2 days. Legs have decreased in swelling currently. Occasional cramping in calves. Generalized feeling of uneasiness. She has a sensation like she felt when she had her "blood clot". Her legs do not hurt nor is she short of breath at rest. Has had severe allergies recently. No palpitations.  03/29/15 - brother PE, DVT. She had "4-5 clots" in legs. Appears to be superficial. Worried about this. Family history. On aspirin. No further chest pain. Resting heart rate continually elevated. Verapamil helping.     Wt Readings from Last 3 Encounters:  03/29/15 262 lb (118.842 kg)  05/17/14 259 lb (117.482 kg)  03/02/14 259 lb (117.482 kg)     Past Medical History  Diagnosis Date  . Varicose veins   . DVT (deep venous thrombosis)   . Ventricular tachycardia   . Vitamin D deficiency   . Hypertension   .  RMSF [facility] spotted fever) 2004    history of   . Gestational diabetes 2000  . 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 Anne Matthews, Dr. Graciela Matthews  . Elevated fasting blood sugar     Past Surgical History  Procedure Laterality Date  . Cholecystectomy    . Cesarean section    . Endovenous ablation saphenous vein w/ laser  02/2011  . Cardiac catheterization      no CAD    Current Outpatient Prescriptions  Medication Sig  Dispense Refill  . aspirin 325 MG tablet Take 325 mg by mouth daily.    Marland Kitchen Fexofenadine HCl (ALLEGRA PO) Take by mouth.    . verapamil (CALAN-SR) 240 MG CR tablet TAKE 1 TABLET (240 MG TOTAL) BY MOUTH DAILY. 30 tablet 0   No current facility-administered medications for this visit.    Allergies:    Allergies  Allergen Reactions  . Azithromycin     Upset stomach     Social History:  The patient  reports that she has never smoked. She has never used smokeless tobacco. She reports that she does not drink alcohol or use illicit drugs.   Family History  Problem Relation Age of Onset  . Hypertension Mother   . COPD Mother   . Other Father     died from accident, h/o lyme disease    ROS:  Please see the history of present illness.   Multiple somatic symptoms. No current chest pain, shortness of breath.  All other systems reviewed and negative.   PHYSICAL EXAM: VS:  BP 128/88 mmHg  Pulse 94  Ht  (1.702 m)  Wt 262 lb (118.842 kg)  BMI 41.03 kg/m2 Well nourished, well developed, in no acute distress HEENT: normal, [place]/AT, EOMI Neck: no JVD, normal carotid upstroke, no bruit Cardiac:  normal S1, S2; RRR; no murmur Lungs:  clear to auscultation bilaterally, no wheezing, rhonchi or rales Abd: soft, nontender, no hepatomegaly, no bruitsOverweight Ext: no edema, 2+ distal pulses Skin: warm and dry GU: deferred Neuro: no focal abnormalities noted, AAO x 3  EKG:  Today-03/29/15-normal sinus rhythm, 94 bpm, no other abnormalities. 03/02/14 - sinus rhythm, 93, no other abnormalities. Normal QT.   ASSESSMENT AND PLAN:  1. Prior atypical chest pain June 2015- ETT low risk stress test.   2. Morbid obesity-continue to encourage weight loss. I have given her a referral to the [facility] referral program. Decrease carbohydrates. She has tried Weight Watchers in the past but has gained back the weight. 3. History of lower extremity thrombosis-seems like last ultrasound was superficial  thrombosis. We will have her seen by Dr. Arbie Matthews [facility] who she is seen back in 2012 for recheck visit. I will also have her seen by hematology, referral for evaluation of hereditary clotting disorder. Her brother had PE and DVT as well. Understands that obesity is large risk factor for thrombosis. 4. History of ventricular tachycardia-stable on verapamil. Syncope 5. 12 month follow up.  Signed, Donato Schultz, MD [facility]  03/29/2015 8:42 AM

## 2015-04-02 ENCOUNTER — Telehealth: Payer: Self-pay | Admitting: Internal Medicine

## 2015-04-02 NOTE — Telephone Encounter (Signed)
NEW PATIENT APPT-S/W PATIENT AND [facility] APPT FOR 07/05 @ 11 W/DR. MOHAMED REFERRING DR. MARK SKAIN DX- THROMBOSIS

## 2015-04-09 ENCOUNTER — Telehealth: Payer: Self-pay | Admitting: *Deleted

## 2015-04-09 NOTE — Telephone Encounter (Signed)
DOES PT. NEED TO STOP TAKING HER ASA 325MG  BEFORE SHE SEES DR.MOHAMED? PER DR.MOHAMED- THIS IS THE FIRST TIME DR.MOHAMED WILL SEE PT. SO HE KNOWS OF NO REASON TO STOP TAKING THE ASA 325MG .

## 2015-04-14 ENCOUNTER — Other Ambulatory Visit: Payer: Self-pay | Admitting: Cardiology

## 2015-04-15 ENCOUNTER — Other Ambulatory Visit: Payer: Self-pay

## 2015-04-15 MED ORDER — VERAPAMIL HCL ER 240 MG PO TBCR: EXTENDED_RELEASE_TABLET | ORAL | Status: DC

## 2015-04-23 ENCOUNTER — Telehealth: Payer: Self-pay | Admitting: Internal Medicine

## 2015-04-23 ENCOUNTER — Ambulatory Visit (HOSPITAL_BASED_OUTPATIENT_CLINIC_OR_DEPARTMENT_OTHER): Payer: BC Managed Care – PPO

## 2015-04-23 ENCOUNTER — Other Ambulatory Visit: Payer: BC Managed Care – PPO

## 2015-04-23 ENCOUNTER — Encounter: Payer: Self-pay | Admitting: Internal Medicine

## 2015-04-23 ENCOUNTER — Ambulatory Visit: Payer: BC Managed Care – PPO

## 2015-04-23 ENCOUNTER — Ambulatory Visit (HOSPITAL_BASED_OUTPATIENT_CLINIC_OR_DEPARTMENT_OTHER): Payer: BC Managed Care – PPO | Admitting: Internal Medicine

## 2015-04-23 ENCOUNTER — Other Ambulatory Visit: Payer: Self-pay | Admitting: Internal Medicine

## 2015-04-23 VITALS — BP 137/88 | HR 83 | Temp 99.0°F | Resp 18 | Ht 67.0 in | Wt 265.4 lb

## 2015-04-23 DIAGNOSIS — I82409 Acute embolism and thrombosis of unspecified deep veins of unspecified lower extremity: Secondary | ICD-10-CM | POA: Insufficient documentation

## 2015-04-23 DIAGNOSIS — I82401 Acute embolism and thrombosis of unspecified deep veins of right lower extremity: Secondary | ICD-10-CM

## 2015-04-23 DIAGNOSIS — Z832 Family history of diseases of the blood and blood-forming organs and certain disorders involving the immune mechanism: Secondary | ICD-10-CM

## 2015-04-23 DIAGNOSIS — Z86718 Personal history of other venous thrombosis and embolism: Secondary | ICD-10-CM | POA: Diagnosis not present

## 2015-04-23 NOTE — Progress Notes (Signed)
Checked in new pt with no financial concerns. °

## 2015-04-23 NOTE — Telephone Encounter (Signed)
Pt confirmed lab per 07/05 POF, gave pt AVS.... KJ

## 2015-04-23 NOTE — Progress Notes (Signed)
[facility] [phone]   [phone]  CONSULT NOTE  REFERRING PHYSICIAN: Dr. Donato Schultz  REASON FOR CONSULTATION:  49 years old white female with history of deep venous thrombosis.  HPI Ruth Matthews is a 49 y.o. female with a past medical history significant for hypertension, paroxysmal ventricular tachycardia followed by Dr. Anne Fu, dyslipidemia, fatigue as well as history of deep venous thrombosis. The patient mentions that she had deep venous thrombosis of the right lower extremity diagnosed in May 2012. She was treated with Coumadin for 6 months at that time and she has been doing fine until spring of 2015 when she had several episodes of superficial phlebitis that has been going on for several months. Her brother was recently diagnosed with DVT and thrombosis and pulmonary embolism. He has hypercoagulable workup that showed an abnormality that required him to be on anticoagulation for life. The patient does not know what kind of abnormality does her brother have. She is concerned about the possibility that she could also carry a genetic or acquired hypercoagulable abnormality and she is here today for evaluation and discussion of her condition.  She denied having any significant complaints today except for the swelling of the lower extremities. She denied having any significant chest pain, shortness breath, cough or hemoptysis. The patient denied having any significant weight loss but has occasional night sweats. She has no headache or visual changes. She has no bleeding issues. Family history significant for mother who died at age 34 with COPD. Father died at age 75 and an accident and brother had the venous thrombosis and pulmonary embolism. The patient is married and has 2 children. She works for the [employer] system. She has no history of smoking but drinks alcohol occasionally and no history of drug abuse.  HPI  Past Medical History    Diagnosis Date  . Varicose veins   . DVT (deep venous thrombosis)   . Ventricular tachycardia   . Vitamin D deficiency   . Hypertension   . RMSF [facility] spotted fever) 2004    history of   . Gestational diabetes 2000  . 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 Anne Fu, Dr. Graciela Husbands  . Elevated fasting blood sugar     Past Surgical History  Procedure Laterality Date  . Cholecystectomy    . Cesarean section    . Endovenous ablation saphenous vein w/ laser  02/2011  . Cardiac catheterization      no CAD    Family History  Problem Relation Age of Onset  . Hypertension Mother   . COPD Mother   . Other Father     died from accident, h/o lyme disease    Social History History  Substance Use Topics  . Smoking status: Never Smoker   . Smokeless tobacco: Never Used  . Alcohol Use: Yes     Comment: occasionall    Allergies  Allergen Reactions  . Azithromycin     Upset stomach     Current Outpatient Prescriptions  Medication Sig Dispense Refill  . acetaminophen (TYLENOL) 325 MG tablet Take 650 mg by mouth every 6 (six) hours as needed.    Marland Kitchen aspirin 325 MG tablet Take 325 mg by mouth daily.    Marland Kitchen Fexofenadine HCl (ALLEGRA PO) Take by mouth.    . verapamil (CALAN-SR) 240 MG CR tablet TAKE 1 TABLET (240 MG TOTAL) BY MOUTH DAILY. 30 tablet 9   No current facility-administered  medications for this visit.    Review of Systems  Constitutional: negative Eyes: negative Ears, nose, mouth, throat, and face: negative Respiratory: negative Cardiovascular: negative Gastrointestinal: negative Genitourinary:negative Integument/breast: negative Hematologic/lymphatic: negative Musculoskeletal:negative Neurological: negative Behavioral/Psych: negative Endocrine: negative Allergic/Immunologic: negative  Physical Exam  UEA:VWUJWRAL:alert, healthy, no distress, well nourished, well developed and anxious SKIN: skin color, texture, turgor are normal, no rashes or  significant lesions HEAD: Normocephalic, No masses, lesions, tenderness or abnormalities EYES: normal, PERRLA EARS: External ears normal, Canals clear OROPHARYNX:no exudate, no erythema and lips, buccal mucosa, and tongue normal  NECK: supple, no adenopathy, no JVD LYMPH:  no palpable lymphadenopathy, no hepatosplenomegaly BREAST:not examined LUNGS: clear to auscultation , and palpation HEART: regular rate & rhythm, no murmurs and no gallops ABDOMEN:abdomen soft, non-tender, obese, normal bowel sounds and no masses or organomegaly BACK: Back symmetric, no curvature., No CVA tenderness EXTREMITIES:no joint deformities, effusion, or inflammation, 1+ edema, no skin discoloration  NEURO: alert & oriented x 3 with fluent speech, no focal motor/sensory deficits  PERFORMANCE STATUS: ECOG 0  LABORATORY DATA: Lab Results  Component Value Date   WBC 7.0 03/02/2014   HGB 12.4 03/02/2014   HCT 37.8 03/02/2014   MCV 81.4 03/02/2014   PLT 242.0 03/02/2014      Chemistry      Component Value Date/Time   NA 138 03/02/2014 1431   K 4.0 03/02/2014 1431   CL 106 03/02/2014 1431   CO2 26 03/02/2014 1431   BUN 9 03/02/2014 1431   CREATININE 0.7 03/02/2014 1431      Component Value Date/Time   CALCIUM 8.9 03/02/2014 1431   ALKPHOS 50 10/29/2010 1019   AST 22 10/29/2010 1019   ALT 27 10/29/2010 1019   BILITOT 0.6 10/29/2010 1019       RADIOGRAPHIC STUDIES: No results found.  ASSESSMENT: This is a very pleasant 49 years old white female with history of deep venous thrombosis in 2012 and recent episodes of superficial phlebitis as well as family history of deep venous thrombosis and pulmonary embolism.    PLAN: The patient is very anxious and concerned about the possibility that she would be carrying a hypercoagulable abnormality especially with her recurrent superficial phlebitis and her family history. I had a lengthy discussion with the patient today about her condition and explained  to her that superficial phlebitis may not be enough to her on the hypercoagulable panel but with her anxiety and concern about her family history, I agree to order the hypercoagulable panel for this patient. If the hypercoagulable panel showed no significant abnormality, I will continue the patient on observation with routine follow-up visit with her primary care physician and no need for her to come back to the cancer Center for further evaluation unless there is any other significant abnormalities. If the lab results showed any abnormality, I will call the patient with the results and recommendation regarding her condition.  The patient voices understanding of current disease status and treatment options and is in agreement with the current care plan.  All questions were answered. The patient knows to call the clinic with any problems, questions or concerns. We can certainly see the patient much sooner if necessary.  Thank you so much for allowing me to participate in the care of H. J. HeinzEden E Raftery. I will continue to follow up the patient with you and assist in her care.  I spent 40 minutes counseling the patient face to face. The total time spent in the appointment was 60  minutes.  Disclaimer: This note was dictated with voice recognition software. Similar sounding words can inadvertently be transcribed and may not be corrected upon review.   Cortney Beissel K. April 23, 2015, 12:21 PM

## 2015-04-26 LAB — HYPERCOAGULABLE PANEL, COMPREHENSIVE
AntiThromb III Func: 82 % (ref 76–126)
Anticardiolipin IgA: 0 APL U/mL (ref <22)
Anticardiolipin IgG: 10 GPL U/mL (ref <23)
Anticardiolipin IgM: 2 MPL U/mL (ref <11)
Beta-2 Glyco I IgG: 4 G Units (ref <20)
Beta-2-Glycoprotein I IgA: 17 A Units (ref <20)
Beta-2-Glycoprotein I IgM: 5 M Units (ref <20)
DRVVT: 33.8 secs (ref <42.9)
Lupus Anticoagulant: NOT DETECTED
PROTEIN S ANTIGEN, TOTAL: 113 % (ref 70–140)
PTT Lupus Anticoagulant: 31.9 secs (ref 28.0–43.0)
Protein C Activity: 149 % — ABNORMAL HIGH (ref 75–133)
Protein C Antigen: 112 % (ref 70–140)
Protein S Activity: 131 % — ABNORMAL HIGH (ref 69–129)

## 2015-04-27 NOTE — Progress Notes (Signed)
Quick Note:  Call patient with the result. No abnormalities in her Hypercoagulable Panel. No need to follow up with me. Rountine Follow up with PCP. ______

## 2015-04-29 ENCOUNTER — Telehealth: Payer: Self-pay | Admitting: *Deleted

## 2015-04-29 NOTE — Telephone Encounter (Signed)
-----   Message from Mohamed Mohamed, MD sent at 04/27/2015 11:55 AM EDT ----- Call patient with the result. No abnormalities in her Hypercoagulable Panel. No need to follow up with me. Rountine Follow up with PCP. 

## 2015-04-29 NOTE — Telephone Encounter (Signed)
Called pt lmovm " no abnormalities with lab results, no follow up with MD at this time. Proceed with routine follow ups with PCP" Pt to call office with concerns.

## 2015-05-01 ENCOUNTER — Telehealth: Payer: Self-pay | Admitting: Medical Oncology

## 2015-05-01 NOTE — Telephone Encounter (Signed)
-----   Message from Si GaulMohamed Mohamed, MD sent at 04/27/2015 11:55 AM EDT ----- Call patient with the result. No abnormalities in her Hypercoagulable Panel. No need to follow up with me. Rountine Follow up with PCP.

## 2015-05-01 NOTE — Telephone Encounter (Signed)
Pt notified and lab results mailed.

## 2015-05-13 ENCOUNTER — Telehealth: Payer: Self-pay

## 2015-05-13 NOTE — Telephone Encounter (Signed)
-----   Message from Conni Slipper, PA-C sent at 05/13/2015  2:16 PM EDT ----- Please call and notify patient there were no abnormalities in her Hypercoagulable Panel. Continue routine follow up with her PCP

## 2015-05-13 NOTE — Telephone Encounter (Signed)
Called and left message for pt to call back regarding lab results. 

## 2015-05-15 ENCOUNTER — Telehealth: Payer: Self-pay | Admitting: Medical Oncology

## 2015-05-15 NOTE — Telephone Encounter (Signed)
I told her to disregard -it was same message i called her with last week.

## 2015-05-16 ENCOUNTER — Encounter: Payer: Self-pay | Admitting: Vascular Surgery

## 2015-05-20 ENCOUNTER — Other Ambulatory Visit: Payer: Self-pay | Admitting: *Deleted

## 2015-05-20 DIAGNOSIS — I83893 Varicose veins of bilateral lower extremities with other complications: Secondary | ICD-10-CM

## 2015-05-21 ENCOUNTER — Ambulatory Visit (INDEPENDENT_AMBULATORY_CARE_PROVIDER_SITE_OTHER): Payer: BC Managed Care – PPO | Admitting: Vascular Surgery

## 2015-05-21 ENCOUNTER — Encounter: Payer: Self-pay | Admitting: Vascular Surgery

## 2015-05-21 ENCOUNTER — Ambulatory Visit (HOSPITAL_COMMUNITY)
Admission: RE | Admit: 2015-05-21 | Discharge: 2015-05-21 | Disposition: A | Discharge status: Home / Self Care | Payer: BC Managed Care – PPO | Source: Ambulatory Visit | Attending: Vascular Surgery | Admitting: Vascular Surgery

## 2015-05-21 VITALS — BP 128/70 | HR 68 | Temp 96.9°F | Resp 16 | Ht 67.0 in | Wt 265.0 lb

## 2015-05-21 DIAGNOSIS — I83893 Varicose veins of bilateral lower extremities with other complications: Secondary | ICD-10-CM | POA: Diagnosis not present

## 2015-05-21 DIAGNOSIS — I872 Venous insufficiency (chronic) (peripheral): Secondary | ICD-10-CM | POA: Diagnosis not present

## 2015-05-21 NOTE — Progress Notes (Signed)
Patient name: Ruth Matthews MRN: [id number] DOB: [birth year] Sex: female   Referred by: Zachery Dauer  Reason for referral:  Chief Complaint  Patient presents with  . Varicose Veins    left leg pain and swelling not as much in right leg    HISTORY OF PRESENT ILLNESS: Patient presents today for discussion of lower extremity symptoms. She is well-known to me from prior ablation of her right great saphenous vein for reflux and incompetence. This was in 2012. She had unusual finding of nonocclusive popliteal vein thrombus at her one-week follow-up visit after ablation of her great saphenous vein. This was quite puzzling since she had no intervention near the location of her popliteal vein. She was treated with 3 months of anticoagulation and subsequent duplex showed complete resolution of the partially occlusive thrombus in her popliteal vein. She has had no prior or subsequent DVT. She does have a family history of brother with DVT and hypercoagulable state. Does report multiple prior episodes of superficial thrombophlebitis and small superficial varicosities. Her main complaint now is of swelling and aching sensation that is mainly from her knees distally bilaterally but can occasionally affect her thighs as well. This is worse on her left than her right leg. She reports significant swelling at the end of the day. She does not have any recent use of compression garments. She did undergo recent hypercoagulable workup and this was negative for hypercoagulable state.  Past Medical History  Diagnosis Date  . Varicose veins   . DVT (deep venous thrombosis)   . Ventricular tachycardia   . Vitamin D deficiency   . Hypertension   . RMSF [facility] spotted fever) 2004    history of   . Gestational diabetes 2000  . 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 Anne Fu, Dr. Graciela Husbands  . Elevated fasting blood sugar     Past Surgical History  Procedure Laterality Date  . Cholecystectomy      . Cesarean section    . Endovenous ablation saphenous vein w/ laser  02/2011  . Cardiac catheterization      no CAD    History   Social History  . Marital Status: Married    Spouse Name: N/A  . Number of Children: N/A  . Years of Education: N/A   Occupational History  . Not on file.   Social History Main Topics  . Smoking status: Never Smoker   . Smokeless tobacco: Never Used  . Alcohol Use: 0.0 oz/week    0 Standard drinks or equivalent per week     Comment: occasionall  . Drug Use: No  . Sexual Activity: Not on file   Other Topics Concern  . Not on file   Social History Narrative    Family History  Problem Relation Age of Onset  . Hypertension Mother   . COPD Mother   . Other Father     died from accident, h/o lyme disease    Allergies as of 05/21/2015 - Review Complete 05/21/2015  Allergen Reaction Noted  . Azithromycin  12/11/2013    Current Outpatient Prescriptions on File Prior to Visit  Medication Sig Dispense Refill  . acetaminophen (TYLENOL) 325 MG tablet Take 650 mg by mouth every 6 (six) hours as needed.    Marland Kitchen aspirin 325 MG tablet Take 325 mg by mouth daily.    Marland Kitchen Fexofenadine HCl (ALLEGRA PO) Take by mouth.    . verapamil (CALAN-SR) 240 MG CR tablet TAKE  1 TABLET (240 MG TOTAL) BY MOUTH DAILY. 30 tablet 9   No current facility-administered medications on file prior to visit.     REVIEW OF SYSTEMS:  Positives indicated with an "X"  CARDIOVASCULAR:  [ ]  chest pain   [ ]  chest pressure   [ ]  palpitations   [ ]  orthopnea   [ ]  dyspnea on exertion   [ ]  claudication   [ ]  rest pain   [x ] DVT   [x ] phlebitis PULMONARY:   [ ]  productive cough   [ ]  asthma   [ ]  wheezing NEUROLOGIC:   [ ]  weakness  [ ]  paresthesias  [ ]  aphasia  [ ]  amaurosis  [ ]  dizziness HEMATOLOGIC:   [ ]  bleeding problems   [ ]  clotting disorders MUSCULOSKELETAL:  [ ]  joint pain   [ ]  joint swelling GASTROINTESTINAL: [ ]   blood in stool  [ ]   hematemesis GENITOURINARY:  [  ]  dysuria  [ ]   hematuria PSYCHIATRIC:  [ ]  history of major depression INTEGUMENTARY:  [ ]  rashes  [ ]  ulcers CONSTITUTIONAL:  [ ]  fever   [ x] chills  PHYSICAL EXAMINATION:  General: The patient is a well-nourished female, in no acute distress. Vital signs are BP 128/70 mmHg  Pulse 68  Temp(Src) 96.9 F (36.1 C)  Resp 16  Ht 5\' 7"  (1.702 m)  Wt 265 lb (120.203 kg)  BMI 41.50 kg/m2  SpO2 99% Pulmonary: There is a good air exchange  Musculoskeletal: There are no major deformities.  There is no significant extremity pain. Neurologic: No focal weakness or paresthesias are detected, Skin: There are no ulcer or rashes noted. Psychiatric: The patient has normal affect. Cardiovascular: Palpable dorsalis pedis pulses bilaterally Scattered large tributary varices bilaterally. More prominent in her right medial calf.   [facility] Vascular Lab Studies:  Ordered and Independently Reviewed she had bilateral venous lower extremity studies. This showed a successful ablation of her right great saphenous vein and no reflux in her right small saphenous vein. She did have slight amount of reflux and a mid calf small saphenous vein on the left with no reflux in her great saphenous vein on the left. She does have deep venous reflux bilaterally limited to the common femoral vein  Impression and Plan:  Had long discussion with the patient. Explained that do not see any evidence of correctable venous pathology. Since her deep system has mild reflux in the common femoral vein only, I suspect that this is not the cause of her discomfort. I explained that she does have normal arterial flow as well. I did explain that she may have some symptom relief with her swelling with diuresis and would consider using a compression garments to see if this makes her legs feel any better. Explained that she could have further workup and neurologic standpoint or other specialties but that I felt this would be low yield for  correctable cause of this. Again stressed that there is no evidence of recurrent DVT with no evidence of reflux of any significance in the deep or superficial system. She was reassured and will see [name] again on as-needed basis    [facility] Office: [phone]

## 2015-11-26 ENCOUNTER — Other Ambulatory Visit: Payer: Self-pay

## 2015-11-26 DIAGNOSIS — Z1231 Encounter for screening mammogram for malignant neoplasm of breast: Secondary | ICD-10-CM

## 2015-12-05 ENCOUNTER — Ambulatory Visit
Admission: RE | Admit: 2015-12-05 | Discharge: 2015-12-05 | Disposition: A | Discharge status: Home / Self Care | Payer: BLUE CROSS/BLUE SHIELD | Source: Ambulatory Visit

## 2015-12-05 DIAGNOSIS — Z1231 Encounter for screening mammogram for malignant neoplasm of breast: Secondary | ICD-10-CM

## 2015-12-25 DIAGNOSIS — E559 Vitamin D deficiency, unspecified: Secondary | ICD-10-CM | POA: Insufficient documentation

## 2015-12-25 DIAGNOSIS — J3489 Other specified disorders of nose and nasal sinuses: Secondary | ICD-10-CM | POA: Insufficient documentation

## 2015-12-25 DIAGNOSIS — J309 Allergic rhinitis, unspecified: Secondary | ICD-10-CM | POA: Insufficient documentation

## 2015-12-25 DIAGNOSIS — E669 Obesity, unspecified: Secondary | ICD-10-CM | POA: Insufficient documentation

## 2015-12-25 DIAGNOSIS — R000 Tachycardia, unspecified: Secondary | ICD-10-CM | POA: Insufficient documentation

## 2015-12-25 DIAGNOSIS — I83893 Varicose veins of bilateral lower extremities with other complications: Secondary | ICD-10-CM | POA: Insufficient documentation

## 2016-01-20 DIAGNOSIS — Z86718 Personal history of other venous thrombosis and embolism: Secondary | ICD-10-CM | POA: Insufficient documentation

## 2016-01-21 ENCOUNTER — Telehealth: Payer: Self-pay | Admitting: Cardiology

## 2016-01-21 NOTE — Telephone Encounter (Signed)
New Message  Rep from [facility] calling to clarify pt's verapamil Rx. Please call back and discuss.

## 2016-01-21 NOTE — Telephone Encounter (Signed)
VERBAL  REFILL GIVEN  TO PHARMACISTS .Zack Seal/CY

## 2016-07-17 DIAGNOSIS — G4700 Insomnia, unspecified: Secondary | ICD-10-CM | POA: Insufficient documentation

## 2016-10-27 DIAGNOSIS — F419 Anxiety disorder, unspecified: Secondary | ICD-10-CM | POA: Insufficient documentation

## 2016-10-27 DIAGNOSIS — F32A Depression, unspecified: Secondary | ICD-10-CM | POA: Insufficient documentation

## 2017-03-19 ENCOUNTER — Other Ambulatory Visit: Payer: Self-pay | Admitting: Cardiology

## 2017-03-24 ENCOUNTER — Other Ambulatory Visit: Payer: Self-pay | Admitting: Cardiology

## 2017-03-25 NOTE — Telephone Encounter (Signed)
Medication Detail    Disp Refills Start End   verapamil (CALAN-SR) 240 MG CR tablet 30 tablet 0 03/19/2017    Sig: TAKE 1 TABLET DAILY   Notes to Pharmacy: Patient needs appointment for further refills.   E-Prescribing Status: Receipt confirmed by pharmacy (03/19/2017 11:24 AM EDT)   Pharmacy   [facility] - [place], [place] - [address]

## 2017-04-03 DIAGNOSIS — I809 Phlebitis and thrombophlebitis of unspecified site: Secondary | ICD-10-CM | POA: Insufficient documentation

## 2017-08-06 DIAGNOSIS — G43909 Migraine, unspecified, not intractable, without status migrainosus: Secondary | ICD-10-CM | POA: Insufficient documentation

## 2017-08-31 ENCOUNTER — Ambulatory Visit: Payer: BLUE CROSS/BLUE SHIELD | Admitting: Nurse Practitioner

## 2017-09-02 ENCOUNTER — Other Ambulatory Visit: Payer: Self-pay | Admitting: Oncology

## 2017-09-02 DIAGNOSIS — R599 Enlarged lymph nodes, unspecified: Secondary | ICD-10-CM

## 2017-09-03 ENCOUNTER — Ambulatory Visit
Admission: RE | Admit: 2017-09-03 | Discharge: 2017-09-03 | Disposition: A | Discharge status: Home / Self Care | Payer: BLUE CROSS/BLUE SHIELD | Source: Ambulatory Visit | Attending: Oncology | Admitting: Oncology

## 2017-09-03 DIAGNOSIS — R599 Enlarged lymph nodes, unspecified: Secondary | ICD-10-CM

## 2018-04-29 DIAGNOSIS — M2550 Pain in unspecified joint: Secondary | ICD-10-CM | POA: Insufficient documentation

## 2018-06-10 ENCOUNTER — Encounter (HOSPITAL_BASED_OUTPATIENT_CLINIC_OR_DEPARTMENT_OTHER): Payer: Self-pay | Admitting: *Deleted

## 2018-06-10 ENCOUNTER — Emergency Department (HOSPITAL_BASED_OUTPATIENT_CLINIC_OR_DEPARTMENT_OTHER): Payer: BLUE CROSS/BLUE SHIELD

## 2018-06-10 ENCOUNTER — Emergency Department (HOSPITAL_BASED_OUTPATIENT_CLINIC_OR_DEPARTMENT_OTHER)
Admission: EM | Admit: 2018-06-10 | Discharge: 2018-06-10 | Disposition: A | Discharge status: Home / Self Care | Payer: BLUE CROSS/BLUE SHIELD | Attending: Emergency Medicine | Admitting: Emergency Medicine

## 2018-06-10 ENCOUNTER — Other Ambulatory Visit: Payer: Self-pay

## 2018-06-10 DIAGNOSIS — I10 Essential (primary) hypertension: Secondary | ICD-10-CM | POA: Diagnosis not present

## 2018-06-10 DIAGNOSIS — Z79899 Other long term (current) drug therapy: Secondary | ICD-10-CM | POA: Insufficient documentation

## 2018-06-10 DIAGNOSIS — R030 Elevated blood-pressure reading, without diagnosis of hypertension: Secondary | ICD-10-CM | POA: Diagnosis present

## 2018-06-10 DIAGNOSIS — Z7982 Long term (current) use of aspirin: Secondary | ICD-10-CM | POA: Insufficient documentation

## 2018-06-10 DIAGNOSIS — I8002 Phlebitis and thrombophlebitis of superficial vessels of left lower extremity: Secondary | ICD-10-CM | POA: Insufficient documentation

## 2018-06-10 NOTE — ED Triage Notes (Signed)
Left lower leg pain. Hx of DVT in the past. She takes ASA.

## 2018-06-10 NOTE — ED Provider Notes (Signed)
[facility] EMERGENCY DEPARTMENT Provider Note   CSN: [id number] Arrival date & time: 06/10/18  1202     History   Chief Complaint Chief Complaint  Patient presents with  . Leg Pain    HPI Ruth Matthews is a 52 y.o. female is a past medical history of ventricular tachycardia, history of recurrent current thrombophlebitis and previous history of DVT who presents the emergency department chief complaint of left lower extremity swelling and pain.  She states that it came on about 2 days ago.  She denies chest pain or shortness of breath.  She denies fevers or chills.  She is concerned for potential DVT.  HPI  Past Medical History:  Diagnosis Date  . DVT (deep venous thrombosis) (HCC)   . Elevated fasting blood sugar   . Gestational diabetes 2000  . Hypertension   . 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 Anne FuSkains, Dr. Graciela HusbandsKlein  . RMSF [facility] fever[facility] 2004   history of   . Varicose veins   . Ventricular tachycardia (HCC)   . Vitamin D deficiency     Patient Active Problem List   Diagnosis Date Noted  . DVT (deep venous thrombosis) (HCC) 04/23/2015  . Paroxysmal ventricular tachycardia (HCC) 12/11/2013  . Essential hypertension, benign 12/11/2013  . Other and unspecified hyperlipidemia 12/11/2013  . Edema 12/11/2013  . Other malaise and fatigue 12/11/2013    Past Surgical History:  Procedure Laterality Date  . CARDIAC CATHETERIZATION     no CAD  . CESAREAN SECTION    . CHOLECYSTECTOMY    . ENDOVENOUS ABLATION SAPHENOUS VEIN W/ LASER Bilateral 02/2011   Both legs a few weeks apart     OB History   None      Home Medications    Prior to Admission medications   Medication Sig Start Date End Date Taking? Authorizing Provider  acetaminophen (TYLENOL) 325 MG tablet Take 650 mg by mouth every 6 (six) hours as needed.   Yes [provider]  aspirin 325 MG tablet Take 325 mg by mouth daily.   Yes [provider]    Fexofenadine HCl (ALLEGRA PO) Take by mouth.   Yes [provider]  fluticasone (FLONASE) 50 MCG/ACT nasal spray Place 1 spray into both nostrils daily.   Yes [provider]  verapamil (CALAN-SR) 240 MG CR tablet TAKE 1 TABLET DAILY 03/19/17  Yes Jake BatheSkains, Mark C, MD    Family History Family History  Problem Relation Age of Onset  . Hypertension Mother   . COPD Mother   . Other Father        died from accident, h/o lyme disease    Social History Social History   Tobacco Use  . Smoking status: Never Smoker  . Smokeless tobacco: Never Used  Substance Use Topics  . Alcohol use: Yes    Alcohol/week: 0.0 standard drinks    Comment: occasionall  . Drug use: No     Allergies   Azithromycin   Review of Systems Review of Systems Ten systems reviewed and are negative for acute change, except as noted in the HPI.   Physical Exam Updated Vital Signs BP (!) 166/92   Pulse 80   Temp 98.3 F (36.8 C) (Oral)   Resp 14   Ht 5\' 7"  (1.702 m)   Wt 108.9 kg   LMP 05/25/2018   SpO2 100%   BMI 37.59 kg/m   Physical Exam  Constitutional: She is oriented to  person, place, and time. She appears well-developed and well-nourished. No distress.  HENT:  Head: Normocephalic and atraumatic.  Eyes: Conjunctivae are normal. No scleral icterus.  Neck: Normal range of motion.  Cardiovascular: Normal rate, regular rhythm and normal heart sounds. Exam reveals no gallop and no friction rub.  No murmur heard. Pulmonary/Chest: Effort normal and breath sounds normal. No respiratory distress.  Abdominal: Soft. Bowel sounds are normal. She exhibits no distension and no mass. There is no tenderness. There is no guarding.  Musculoskeletal:  Swelling of the left lower extremity.  There is blotchy, poorly demarcated tender erythema of the lower extremity.  Compartments are soft.  The left lower extremity is mildly more swollen than the right.  There is normal DP and PT pulses  bilaterally with normal sensation.  Neurological: She is alert and oriented to person, place, and time.  Skin: Skin is warm and dry. She is not diaphoretic.  Psychiatric: Her behavior is normal.  Nursing note and vitals reviewed.    ED Treatments / Results  Labs (all labs ordered are listed, but only abnormal results are displayed) Labs Reviewed - No data to display  EKG None  Radiology US Venous Img Lower  Left (dvt Study)  Result Date: 06/10/2018 CLINICAL DATA:  Pain, swelling, redness x1 week EXAM: LEFT LOWER EXTREMITY VENOUS DOPPLER ULTRASOUND TECHNIQUE: Gray-scale sonography with compression, as well as color and duplex ultrasound, were performed to evaluate the deep venous system from the level of the common femoral vein through the popliteal and proximal calf veins. COMPARISON:  None FINDINGS: Normal compressibility of the common femoral, superficial femoral, and popliteal veins, as well as the proximal calf veins. No filling defects to suggest DVT on grayscale or color Doppler imaging. Doppler waveforms show normal direction of venous flow, normal respiratory phasicity and response to augmentation. Segments of the left great saphenous vein are noncompressible in the calf. There are thrombosed subcutaneous varicose veins in the calf. Survey views of the contralateral common femoral vein are unremarkable. IMPRESSION: 1.  No evidence of left lower extremity deep vein thrombosis. 2. Superficial thrombophlebitis in the left calf involving the great saphenous vein and associated varicose veins. Electronically Signed   By: Corlis Leak M.D.   On: 06/10/2018 14:38    Procedures Procedures (including critical care time)  Medications Ordered in ED Medications - No data to display   Initial Impression / Assessment and Plan / ED Course  I have reviewed the triage vital signs and the nursing notes.  Pertinent labs & imaging results that were available during my care of the patient were  reviewed by me and considered in my medical decision making (see chart for details).    Patient with c/o lower extremity pain and swelling I have gathered information from the patient as well as her husband who was present at bedside and review of EMR. I have reviewed the DVT ultrasound which shows superficial thrombophlebitis and does not appear to show deep vein thrombosis. I agree with the radiologists interpretation.  I discussed the findings with the patient he will use supportive care.  She may follow-up with a vein specialist or her PCP.  Discussed return precautions the patient appears appropriate for discharge at this time   Final Clinical Impressions(s) / ED Diagnoses   Final diagnoses:  Thrombophlebitis of superficial veins of left lower extremity  Elevated blood pressure reading    ED Discharge Orders    None       Arthor Captain, PA-C  06/10/18 1946    Maia Plan, MD 06/12/18 1029

## 2018-06-10 NOTE — Discharge Instructions (Addendum)
Ultrasound showed superficial thrombophlebitis.  You do not have a deep venous thrombosis.  Please follow the instructions on home care in the follow-up paperwork.  Return for the reasons listed below: Get help right away if: You have a sudden onset of chest pain or difficulty breathing. You have a fever or persistent symptoms for more than 2-3 days. You have a fever and your symptoms suddenly get worse

## 2018-08-16 ENCOUNTER — Telehealth: Payer: Self-pay

## 2018-08-16 NOTE — Telephone Encounter (Signed)
SENT REFERRAL TO SCHEDULING AND FILED NOTES 

## 2018-08-31 ENCOUNTER — Encounter: Payer: Self-pay | Admitting: Cardiology

## 2018-09-30 ENCOUNTER — Ambulatory Visit: Payer: BLUE CROSS/BLUE SHIELD | Admitting: Cardiology

## 2018-09-30 ENCOUNTER — Encounter: Payer: Self-pay | Admitting: Cardiology

## 2018-09-30 VITALS — BP 120/84 | HR 82 | Ht 67.0 in | Wt 241.0 lb

## 2018-09-30 DIAGNOSIS — E785 Hyperlipidemia, unspecified: Secondary | ICD-10-CM | POA: Diagnosis not present

## 2018-09-30 DIAGNOSIS — E669 Obesity, unspecified: Secondary | ICD-10-CM

## 2018-09-30 DIAGNOSIS — R072 Precordial pain: Secondary | ICD-10-CM | POA: Diagnosis not present

## 2018-09-30 DIAGNOSIS — R5383 Other fatigue: Secondary | ICD-10-CM | POA: Diagnosis not present

## 2018-09-30 DIAGNOSIS — Z6837 Body mass index (BMI) 37.0-37.9, adult: Secondary | ICD-10-CM

## 2018-09-30 MED ORDER — METOPROLOL TARTRATE 100 MG PO TABS: ORAL_TABLET | ORAL | 0 refills | Status: DC

## 2018-09-30 MED ORDER — ASPIRIN EC 81 MG PO TBEC: 81.0000 mg | DELAYED_RELEASE_TABLET | Freq: Every day | ORAL | 3 refills | Status: DC

## 2018-09-30 NOTE — Progress Notes (Signed)
Cardiology Office Note:    Date:  10/04/2018   ID:  Ruth Matthews, DOB Aug 18, 1966, MRN [id number]  PCP:  Macy Mis, MD  Cardiologist:  New patient - Dr Delton See  Referring MD: Macy Mis, MD   Chief complain: chest pain, fatigue  History of Present Illness:    Ruth Matthews is a 52 y.o. 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 She was previously seen by Dr Anne Fu for palpitations in 2016, started on verapamil and asymptomatic. She underwent an exercise tolerance test in 2015 that was low risk. Echo in 2012 - normal LVEF, mild LA dilatation. She doesn't exercise, but walks at school. She has noticed exertional chest tightness and DOE. No FH of early CAD.  Past Medical History:  Diagnosis Date  . DVT (deep venous thrombosis) (HCC)   . Elevated fasting blood sugar   . Gestational diabetes 2000  . Hypertension   . 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 Anne Fu, Dr. Graciela Husbands  . [facility] spotted fever) 2004   history of   . Varicose veins   . Ventricular tachycardia (HCC)   . Vitamin D deficiency     Past Surgical History:  Procedure Laterality Date  . CARDIAC CATHETERIZATION     no CAD  . CESAREAN SECTION    . CHOLECYSTECTOMY    . ENDOVENOUS ABLATION SAPHENOUS VEIN W/ LASER Bilateral 02/2011   Both legs a few weeks apart   Current Medications: Current Meds  Medication Sig  . acetaminophen (TYLENOL) 325 MG tablet Take 650 mg by mouth every 6 (six) hours as needed.  . Cholecalciferol (VITAMIN D) 50 MCG (2000 UT) CAPS Take 4,000 Int'l Units by mouth daily.  . Eszopiclone 3 MG TABS Take 1 tablet by mouth at bedtime as needed for sleep.  . fexofenadine (ALLEGRA) 180 MG tablet Take 1 tablet by mouth daily.  . fluticasone (FLONASE) 50 MCG/ACT nasal spray Place 1 spray into both nostrils daily.  . verapamil (CALAN-SR) 240 MG CR tablet TAKE 1 TABLET DAILY  . [DISCONTINUED]  aspirin 325 MG tablet Take 325 mg by mouth daily.     Allergies:   Azithromycin   Social History   Socioeconomic History  . Marital status: Married    Spouse name: Not on file  . Number of children: Not on file  . Years of education: Not on file  . Highest education level: Not on file  Occupational History  . Not on file  Social Needs  . Financial resource strain: Not on file  . Food insecurity:    Worry: Not on file    Inability: Not on file  . Transportation needs:    Medical: Not on file    Non-medical: Not on file  Tobacco Use  . Smoking status: Never Smoker  . Smokeless tobacco: Never Used  Substance and Sexual Activity  . Alcohol use: Yes    Alcohol/week: 0.0 standard drinks    Comment: occasionall  . Drug use: No  . Sexual activity: Not on file  Lifestyle  . Physical activity:    Days per week: Not on file    Minutes per session: Not on file  . Stress: Not on file  Relationships  . Social connections:    Talks on phone: Not on file    Gets together: Not on file    Attends religious service: Not on file  Active member of club or organization: Not on file    Attends meetings of clubs or organizations: Not on file    Relationship status: Not on file  Other Topics Concern  . Not on file  Social History Narrative  . Not on file     Family History: The patient's family history includes COPD in her mother; Hypertension in her mother; Other in her father.  ROS:   Please see the history of present illness.    All other systems reviewed and are negative.  EKGs/Labs/Other Studies Reviewed:    The following studies were reviewed today:  EKG:  EKG is  ordered today.  The ekg ordered today demonstrates NSR, normal ECG, personally reviewed.  Recent Labs: 09/30/2018: ALT 15; BUN 12; Creatinine, Ser 0.72; Hemoglobin 13.0; Platelets 199; Potassium 4.5; Sodium 142; TSH 1.080  Recent Lipid Panel    Component Value Date/Time   CHOL (H) 10/29/2010 1019    210         ATP III CLASSIFICATION:  <200     mg/dL   Desirable  161-096  mg/dL   Borderline High  >=045    mg/dL   High          TRIG 409 10/29/2010 1019   HDL 56 10/29/2010 1019   CHOLHDL 3.8 10/29/2010 1019   VLDL 24 10/29/2010 1019   LDLCALC (H) 10/29/2010 1019    130        Total Cholesterol/HDL:CHD Risk Coronary Heart Disease Risk Table                     Men   Women  1/2 Average Risk   3.4   3.3  Average Risk       5.0   4.4  2 X Average Risk   9.6   7.1  3 X Average Risk  23.4   11.0        Use the calculated Patient Ratio above and the CHD Risk Table to determine the patient's CHD Risk.        ATP III CLASSIFICATION (LDL):  <100     mg/dL   Optimal  811-914  mg/dL   Near or Above                    Optimal  130-159  mg/dL   Borderline  782-956  mg/dL   High  >213     mg/dL   Very High    Physical Exam:    VS:  BP 120/84   Pulse 82   Ht 5\' 7"  (1.702 m)   Wt 241 lb (109.3 kg)   SpO2 97%   BMI 37.75 kg/m     Wt Readings from Last 3 Encounters:  09/30/18 241 lb (109.3 kg)  06/10/18 240 lb (108.9 kg)  05/21/15 265 lb (120.2 kg)    GEN: Well nourished, well developed in no acute distress HEENT: Normal NECK: No JVD; No carotid bruits LYMPHATICS: No lymphadenopathy CARDIAC: RRR, no murmurs, rubs, gallops RESPIRATORY:  Clear to auscultation without rales, wheezing or rhonchi  ABDOMEN: Soft, non-tender, non-distended MUSCULOSKELETAL:  No edema; No deformity  SKIN: Warm and dry NEUROLOGIC:  Alert and oriented x 3 PSYCHIATRIC:  Normal affect   ASSESSMENT:    1. Precordial chest pain   2. Fatigue, unspecified type   3. Hyperlipidemia, unspecified hyperlipidemia type   4. Class 2 obesity with body mass index (BMI) of 37.0 to 37.9 in adult, unspecified obesity  type, unspecified whether serious comorbidity present    PLAN:    In order of problems listed above:  1. Chest pain - with risk factors - obesity, untreated HLP, physical inactivity. We will obtain  coronary CTA. Decrease aspirin to 81 mg po daily. 2. Hyperlipidemia - LDL particles 1400, normal HDL and TG, we will start therapy based on coronary CT results.   Medication Adjustments/Labs and Tests Ordered: Current medicines are reviewed at length with the patient today.  Concerns regarding medicines are outlined above.  Orders Placed This Encounter  Procedures  . CT CORONARY MORPH W/CTA COR W/SCORE W/CA W/CM &/OR WO/CM  . CT CORONARY FRACTIONAL FLOW RESERVE DATA PREP  . CT CORONARY FRACTIONAL FLOW RESERVE FLUID ANALYSIS  . Basic metabolic panel  . CBC  . TSH  . Hepatic function panel  . NMR, lipoprofile  . Apolipoprotein B  . Lipoprotein A (LPA)  . EKG 12-Lead   Meds ordered this encounter  Medications  . metoprolol tartrate (LOPRESSOR) 100 MG tablet    Sig: Take 1 tablet by mouth 2 hours prior to Cardiac CT    Dispense:  1 tablet    Refill:  0  . aspirin EC 81 MG tablet    Sig: Take 1 tablet (81 mg total) by mouth daily.    Dispense:  90 tablet    Refill:  3    Patient Instructions  Medication Instructions:  Your physician has recommended you make the following change in your medication:   DECREASE: aspirin to 81 mg daily  If you need a refill on your cardiac medications before your next appointment, please call your pharmacy.   Lab work: TODAY: BMET, CBC, TSH, LFTS, NMR Lipids  If you have labs (blood work) drawn today and your tests are completely normal, you will receive your results only by: Marland Kitchen. MyChart Message (if you have MyChart) OR . A paper copy in the mail If you have any lab test that is abnormal or we need to change your treatment, we will call you to review the results.  Testing/Procedures: Your physician has requested that you have cardiac CT. Cardiac computed tomography (CT) is a painless test that uses an x-ray machine to take clear, detailed pictures of your heart. For further information please visit [email or web address]. Please follow instruction  sheet as given.   Follow-Up: At [facility], you and your health needs are our priority.  As part of our continuing mission to provide you with exceptional heart care, we have created designated Provider Care Teams.  These Care Teams include your primary Cardiologist (physician) and Advanced Practice Providers (APPs -  Physician Assistants and Nurse Practitioners) who all work together to provide you with the care you need, when you need it. . You will need a follow up appointment in 6 months.  Please call our office 2 months in advance to schedule this appointment.  You may see Tobias AlexanderKatarina Shyrl Obi, MD or one of the following Advanced Practice Providers on your designated Care Team:   . Robbie LisBrittainy Simmons, PA-C . Dayna Dunn, PA-C . Jacolyn ReedyMichele Lenze, PA-C  Any Other Special Instructions Will Be Listed Below (If Applicable).  CARDIAC CT INSTRUCTIONS  [facility] on _____ at _____ AM (30-45 minutes prior to test start time)  [facility] [address] [place], [place] [address] [phone]  Proceed to the [facility] Radiology Department (First Floor).  Please follow these instructions carefully (unless  otherwise directed):   On the Night Before the Test: . Be sure to Drink plenty of water. . Do not consume any caffeinated/decaffeinated beverages or chocolate 12 hours prior to your test. . Do not take any antihistamines 12 hours prior to your test.   On the Day of the Test: . Drink plenty of water. Do not drink any water within one hour of the test. . Do not eat any food 4 hours prior to the test. . You may take your regular medications prior to the test.  . Take metoprolol (Lopressor) two hours prior to test.      After the Test: . Drink plenty of water. . After receiving IV contrast, you may experience a mild flushed feeling. This is normal. . On occasion, you may experience a mild rash up to 24 hours after the  test. This is not dangerous. If this occurs, you can take Benadryl 25 mg and increase your fluid intake. . If you experience trouble breathing, this can be serious. If it is severe call 911 IMMEDIATELY. If it is mild, please call our office.      Signed, Tobias Alexander, MD  10/04/2018 12:21 AM    [facility]

## 2018-09-30 NOTE — Patient Instructions (Addendum)
Medication Instructions:  Your physician has recommended you make the following change in your medication:   DECREASE: aspirin to 81 mg daily  If you need a refill on your cardiac medications before your next appointment, please call your pharmacy.   Lab work: TODAY: BMET, CBC, TSH, LFTS, NMR Lipids  If you have labs (blood work) drawn today and your tests are completely normal, you will receive your results only by: Marland Kitchen. MyChart Message (if you have MyChart) OR . A paper copy in the mail If you have any lab test that is abnormal or we need to change your treatment, we will call you to review the results.  Testing/Procedures: Your physician has requested that you have cardiac CT. Cardiac computed tomography (CT) is a painless test that uses an x-ray machine to take clear, detailed pictures of your heart. For further information please visit [email or web address]. Please follow instruction sheet as given.   Follow-Up: At [facility], you and your health needs are our priority.  As part of our continuing mission to provide you with exceptional heart care, we have created designated Provider Care Teams.  These Care Teams include your primary Cardiologist (physician) and Advanced Practice Providers (APPs -  Physician Assistants and Nurse Practitioners) who all work together to provide you with the care you need, when you need it. . You will need a follow up appointment in 6 months.  Please call our office 2 months in advance to schedule this appointment.  You may see Ruth AlexanderKatarina Nelson, Ruth Matthews or one of the following Advanced Practice Providers on your designated Care Team:   . Ruth LisBrittainy Simmons, Ruth Matthews . Ruth Matthews, Ruth Matthews . Ruth ReedyMichele Lenze, Ruth Matthews  Any Other Special Instructions Will Be Listed Below (If Applicable).  CARDIAC CT INSTRUCTIONS  [facility] on _____ at _____ AM (30-45 minutes prior to test start time)  [facility] [address] [place], [place] [address] [phone]  Proceed to the [facility] Radiology Department (First Floor).  Please follow these instructions carefully (unless otherwise directed):   On the Night Before the Test: . Be sure to Drink plenty of water. . Do not consume any caffeinated/decaffeinated beverages or chocolate 12 hours prior to your test. . Do not take any antihistamines 12 hours prior to your test.   On the Day of the Test: . Drink plenty of water. Do not drink any water within one hour of the test. . Do not eat any food 4 hours prior to the test. . You may take your regular medications prior to the test.  . Take metoprolol (Lopressor) two hours prior to test.      After the Test: . Drink plenty of water. . After receiving IV contrast, you may experience a mild flushed feeling. This is normal. . On occasion, you may experience a mild rash up to 24 hours after the test. This is not dangerous. If this occurs, you can take Benadryl 25 mg and increase your fluid intake. . If you experience trouble breathing, this can be serious. If it is severe call 911 IMMEDIATELY. If it is mild, please call our office.

## 2018-10-02 LAB — BASIC METABOLIC PANEL
BUN/Creatinine Ratio: 17 (ref 9–23)
BUN: 12 mg/dL (ref 6–24)
CO2: 23 mmol/L (ref 20–29)
Calcium: 9.5 mg/dL (ref 8.7–10.2)
Chloride: 103 mmol/L (ref 96–106)
Creatinine, Ser: 0.72 mg/dL (ref 0.57–1.00)
GFR calc Af Amer: 113 mL/min/{1.73_m2} (ref >59)
GFR calc non Af Amer: 98 mL/min/{1.73_m2} (ref >59)
Glucose: 104 mg/dL — ABNORMAL HIGH (ref 65–99)
Potassium: 4.5 mmol/L (ref 3.5–5.2)
Sodium: 142 mmol/L (ref 134–144)

## 2018-10-02 LAB — CBC
Hematocrit: 39.9 % (ref 34.0–46.6)
Hemoglobin: 13 g/dL (ref 11.1–15.9)
MCH: 27.3 pg (ref 26.6–33.0)
MCHC: 32.6 g/dL (ref 31.5–35.7)
MCV: 84 fL (ref 79–97)
Platelets: 199 10*3/uL (ref 150–450)
RBC: 4.77 x10E6/uL (ref 3.77–5.28)
RDW: 14.2 % (ref 12.3–15.4)
WBC: 5 10*3/uL (ref 3.4–10.8)

## 2018-10-02 LAB — NMR, LIPOPROFILE
Cholesterol, Total: 181 mg/dL (ref 100–199)
HDL Particle Number: 25.9 umol/L — ABNORMAL LOW (ref >=30.5)
HDL-C: 62 mg/dL (ref >39)
LDL Particle Number: 1426 nmol/L — ABNORMAL HIGH (ref <1000)
LDL Size: 21.3 nm (ref >20.5)
LDL-C: 104 mg/dL — ABNORMAL HIGH (ref 0–99)
LP-IR Score: <25 (ref <=45)
Small LDL Particle Number: 511 nmol/L (ref <=527)
Triglycerides: 74 mg/dL (ref 0–149)

## 2018-10-02 LAB — TSH: TSH: 1.08 u[IU]/mL (ref 0.450–4.500)

## 2018-10-02 LAB — HEPATIC FUNCTION PANEL
ALT: 15 IU/L (ref 0–32)
AST: 12 IU/L (ref 0–40)
Albumin: 4.2 g/dL (ref 3.5–5.5)
Alkaline Phosphatase: 56 IU/L (ref 39–117)
Bilirubin Total: 0.4 mg/dL (ref 0.0–1.2)
Bilirubin, Direct: 0.11 mg/dL (ref 0.00–0.40)
Total Protein: 7.1 g/dL (ref 6.0–8.5)

## 2018-10-02 LAB — LIPOPROTEIN A (LPA): Lipoprotein (a): 36.2 nmol/L (ref <75.0)

## 2018-10-02 LAB — APOLIPOPROTEIN B: Apolipoprotein B: 92 mg/dL — ABNORMAL HIGH (ref <90)

## 2018-11-09 ENCOUNTER — Telehealth: Payer: Self-pay | Admitting: *Deleted

## 2018-11-09 NOTE — Telephone Encounter (Signed)
Cardiac ct  Received: Today  Message Contents  Gabriel Cirri, LPN        Good morning Ruth Matthews , I have sent a 7-10 day letter to patient , we have not had a response back.   Thank you  Ruth Matthews

## 2019-04-10 ENCOUNTER — Emergency Department (HOSPITAL_BASED_OUTPATIENT_CLINIC_OR_DEPARTMENT_OTHER)
Admission: EM | Admit: 2019-04-10 | Discharge: 2019-04-10 | Disposition: A | Discharge status: Home / Self Care | Payer: BC Managed Care – PPO | Attending: Emergency Medicine | Admitting: Emergency Medicine

## 2019-04-10 ENCOUNTER — Encounter (HOSPITAL_BASED_OUTPATIENT_CLINIC_OR_DEPARTMENT_OTHER): Payer: Self-pay

## 2019-04-10 ENCOUNTER — Emergency Department (HOSPITAL_BASED_OUTPATIENT_CLINIC_OR_DEPARTMENT_OTHER): Payer: BC Managed Care – PPO

## 2019-04-10 ENCOUNTER — Telehealth: Payer: Self-pay

## 2019-04-10 ENCOUNTER — Other Ambulatory Visit: Payer: Self-pay

## 2019-04-10 DIAGNOSIS — Z7982 Long term (current) use of aspirin: Secondary | ICD-10-CM | POA: Insufficient documentation

## 2019-04-10 DIAGNOSIS — M545 Low back pain, unspecified: Secondary | ICD-10-CM

## 2019-04-10 DIAGNOSIS — M549 Dorsalgia, unspecified: Secondary | ICD-10-CM | POA: Diagnosis present

## 2019-04-10 DIAGNOSIS — I10 Essential (primary) hypertension: Secondary | ICD-10-CM | POA: Diagnosis not present

## 2019-04-10 DIAGNOSIS — Z79899 Other long term (current) drug therapy: Secondary | ICD-10-CM | POA: Diagnosis not present

## 2019-04-10 DIAGNOSIS — R109 Unspecified abdominal pain: Secondary | ICD-10-CM | POA: Insufficient documentation

## 2019-04-10 LAB — COMPREHENSIVE METABOLIC PANEL
ALT: 22 U/L (ref 0–44)
AST: 14 U/L — ABNORMAL LOW (ref 15–41)
Albumin: 4 g/dL (ref 3.5–5.0)
Alkaline Phosphatase: 43 U/L (ref 38–126)
Anion gap: 9 (ref 5–15)
BUN: 13 mg/dL (ref 6–20)
CO2: 21 mmol/L — ABNORMAL LOW (ref 22–32)
Calcium: 8.8 mg/dL — ABNORMAL LOW (ref 8.9–10.3)
Chloride: 108 mmol/L (ref 98–111)
Creatinine, Ser: 0.8 mg/dL (ref 0.44–1.00)
GFR calc Af Amer: >60 mL/min (ref >60)
GFR calc non Af Amer: >60 mL/min (ref >60)
Glucose, Bld: 106 mg/dL — ABNORMAL HIGH (ref 70–99)
Potassium: 4 mmol/L (ref 3.5–5.1)
Sodium: 138 mmol/L (ref 135–145)
Total Bilirubin: 0.6 mg/dL (ref 0.3–1.2)
Total Protein: 7.4 g/dL (ref 6.5–8.1)

## 2019-04-10 LAB — URINALYSIS, ROUTINE W REFLEX MICROSCOPIC
Bilirubin Urine: NEGATIVE
Glucose, UA: NEGATIVE mg/dL
Hgb urine dipstick: NEGATIVE
Ketones, ur: NEGATIVE mg/dL
Leukocytes,Ua: NEGATIVE
Nitrite: NEGATIVE
Protein, ur: NEGATIVE mg/dL
Specific Gravity, Urine: <1.005 — ABNORMAL LOW (ref 1.005–1.030)
pH: 6 (ref 5.0–8.0)

## 2019-04-10 MED ORDER — IBUPROFEN 400 MG PO TABS
400.0000 mg | ORAL_TABLET | Freq: Once | ORAL | Status: AC
  Administered 2019-04-10: 400 mg via ORAL
  Filled 2019-04-10: qty 1

## 2019-04-10 MED ORDER — LORAZEPAM 1 MG PO TABS
1.0000 mg | ORAL_TABLET | Freq: Once | ORAL | Status: AC
  Administered 2019-04-10: 1 mg via ORAL
  Filled 2019-04-10: qty 1

## 2019-04-10 MED ORDER — HYDROCODONE-ACETAMINOPHEN 5-325 MG PO TABS: 1.0000 | ORAL_TABLET | Freq: Four times a day (QID) | ORAL | 0 refills | Status: DC | PRN

## 2019-04-10 MED ORDER — DEXAMETHASONE 4 MG PO TABS
8.0000 mg | ORAL_TABLET | Freq: Once | ORAL | Status: AC
  Administered 2019-04-10: 8 mg via ORAL
  Filled 2019-04-10: qty 2

## 2019-04-10 NOTE — Telephone Encounter (Signed)
Opened in error

## 2019-04-10 NOTE — Discharge Instructions (Signed)
You have some fluid tracking near your R ovary back behind your uterus. This is likely what is causing your symptoms and probably from a ruptured ovarian cyst. This is generally not serious and should resolve with time. Take pain medicine as needed. Your activity is only limited by your discomfort. Follow-up with your OB/GYN for persistent symptoms.

## 2019-04-10 NOTE — ED Notes (Signed)
Ambulated to bathroom with steady gait

## 2019-04-10 NOTE — ED Provider Notes (Signed)
[facility] EMERGENCY DEPARTMENT Provider Note   CSN: [id number] Arrival date & time: 04/10/19  [id number]     History   Chief Complaint Chief Complaint  Patient presents with  . Back Pain    HPI Ruth Matthews is a 53 y.o. female.     HPI   53yF with R back and flank pain. Occasional radiation more anteriorly into abdomen. Onset a coupe weeks ago but worse in last few days. Worse with movement particularly rotation of trunk such as when turning over in bed. Says she tried naprosyn but this just upset her stomach. NO radicular symptoms into LE. No fever or chills. No n/v. No unusual vaginal bleeding or discharge. No urinary complaints.   Past Medical History:  Diagnosis Date  . DVT (deep venous thrombosis) (HCC)   . Elevated fasting blood sugar   . Gestational diabetes 2000  . Hypertension   . 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 Anne FuSkains, Dr. Graciela HusbandsKlein  . [facility] 2004   history of   . Varicose veins   . Ventricular tachycardia (HCC)   . Vitamin D deficiency     Patient Active Problem List   Diagnosis Date Noted  . DVT (deep venous thrombosis) (HCC) 04/23/2015  . Paroxysmal ventricular tachycardia (HCC) 12/11/2013  . Essential hypertension, benign 12/11/2013  . Other and unspecified hyperlipidemia 12/11/2013  . Edema 12/11/2013  . Other malaise and fatigue 12/11/2013    Past Surgical History:  Procedure Laterality Date  . CARDIAC CATHETERIZATION     no CAD  . CESAREAN SECTION    . CHOLECYSTECTOMY    . ENDOVENOUS ABLATION SAPHENOUS VEIN W/ LASER Bilateral 02/2011   Both legs a few weeks apart     OB History   No obstetric history on file.      Home Medications    Prior to Admission medications   Medication Sig Start Date End Date Taking? Authorizing Provider  acetaminophen (TYLENOL) 325 MG tablet Take 650 mg by mouth every 6 (six) hours as needed.    [provider]  aspirin EC 81 MG tablet Take 1  tablet (81 mg total) by mouth daily. 09/30/18   Lars MassonNelson, Katarina H, MD  Cholecalciferol (VITAMIN D) 50 MCG (2000 UT) CAPS Take 4,000 Int'l Units by mouth daily.    [provider]  Eszopiclone 3 MG TABS Take 1 tablet by mouth at bedtime as needed for sleep. 08/01/18   [provider]  fexofenadine (ALLEGRA) 180 MG tablet Take 1 tablet by mouth daily.    [provider]  fluticasone (FLONASE) 50 MCG/ACT nasal spray Place 1 spray into both nostrils daily.    [provider]  metoprolol tartrate (LOPRESSOR) 100 MG tablet Take 1 tablet by mouth 2 hours prior to Cardiac CT 09/30/18   Lars MassonNelson, Katarina H, MD  verapamil (CALAN-SR) 240 MG CR tablet TAKE 1 TABLET DAILY 03/19/17   Jake BatheSkains, Mark C, MD    Family History Family History  Problem Relation Age of Onset  . Hypertension Mother   . COPD Mother   . Other Father        died from accident, h/o lyme disease    Social History Social History   Tobacco Use  . Smoking status: Never Smoker  . Smokeless tobacco: Never Used  Substance Use Topics  . Alcohol use: Yes    Alcohol/week: 0.0 standard drinks    Comment: occasionall  . Drug use: No  Allergies   Azithromycin and Levaquin [levofloxacin]   Review of Systems Review of Systems  All systems reviewed and negative, other than as noted in HPI.  Physical Exam Updated Vital Signs BP (!) 146/87   Pulse 88   Temp 98.5 F (36.9 C) (Oral)   Resp 16   Ht 5\' 7"  (1.702 m)   Wt 108.9 kg   SpO2 100%   BMI 37.59 kg/m   Physical Exam Vitals signs and nursing note reviewed.  Constitutional:      General: She is not in acute distress.    Appearance: She is well-developed.  HENT:     Head: Normocephalic and atraumatic.  Eyes:     General:        Right eye: No discharge.        Left eye: No discharge.     Conjunctiva/sclera: Conjunctivae normal.  Neck:     Musculoskeletal: Neck supple.  Cardiovascular:     Rate and Rhythm: Normal rate and  regular rhythm.     Heart sounds: Normal heart sounds. No murmur. No friction rub. No gallop.   Pulmonary:     Effort: Pulmonary effort is normal. No respiratory distress.     Breath sounds: Normal breath sounds.  Abdominal:     General: There is no distension.     Palpations: Abdomen is soft.     Tenderness: There is no abdominal tenderness.  Musculoskeletal:     Comments: TTP R lower thoracic back. No midline spinal tenderness. Abdomen benign.   Skin:    General: Skin is warm and dry.  Neurological:     Mental Status: She is alert.  Psychiatric:        Behavior: Behavior normal.        Thought Content: Thought content normal.      ED Treatments / Results  Labs (all labs ordered are listed, but only abnormal results are displayed) Labs Reviewed  URINALYSIS, ROUTINE W REFLEX MICROSCOPIC - Abnormal; Notable for the following components:      Result Value   Specific Gravity, Urine <1.005 (*)    All other components within normal limits  COMPREHENSIVE METABOLIC PANEL - Abnormal; Notable for the following components:   CO2 21 (*)    Glucose, Bld 106 (*)    Calcium 8.8 (*)    AST 14 (*)    All other components within normal limits    EKG None  Radiology Ct Renal Stone Study  Result Date: 04/10/2019 CLINICAL DATA:  Right-sided flank region pain EXAM: CT ABDOMEN AND PELVIS WITHOUT CONTRAST TECHNIQUE: Multidetector CT imaging of the abdomen and pelvis was performed following the standard protocol without oral or IV contrast. COMPARISON:  None. FINDINGS: Lower chest: Lung bases are clear. There is a small hiatal type hernia. Hepatobiliary: There is a 5 mm probable cyst near the dome of the liver on the left anteriorly. No other focal liver lesion is evident on this noncontrast enhanced study. The gallbladder is absent. There is no appreciable biliary duct dilatation. Pancreas: There is no pancreatic mass or inflammatory focus. Spleen: No splenic lesions are evident.  Adrenals/Urinary Tract: Adrenals bilaterally appear unremarkable. Kidneys bilaterally show no evident mass or hydronephrosis on either side. There is no evident renal or ureteral calculus on either side. Urinary bladder is midline with wall thickness within normal limits. There are multiple phleboliths in the pelvis which are near but apparently separate from distal ureters. Stomach/Bowel: There is no appreciable bowel wall or mesenteric thickening. There  is moderate stool in the colon. There is no evident bowel obstruction. No free air or portal venous air evident. Vascular/Lymphatic: There is no abdominal aortic aneurysm. No vascular lesions are appreciable on this noncontrast enhanced study. There is no evident adenopathy in the abdomen or pelvis. Reproductive: The uterus is anteverted. There is a probable nabothian cyst in the cervix measuring 8 x 7 mm. There is loculated fluid tracking from the right adnexa posterior to the uterus. No adnexal mass is demonstrable. Other: Appendix appears unremarkable. There is no abscess or ascites in the abdomen or pelvis. There is a small ventral hernia containing only fat. Musculoskeletal: There is degenerative change in the lower thoracic and lumbar regions. No blastic or lytic bone lesions are evident. There is no intramuscular lesion. IMPRESSION: 1. Loculated fluid tracking from the right adnexa posterior to the uterus on the right. Suspect recent ovarian cyst rupture, likely with hemorrhage. No adnexal mass seen in this area. 2. No bowel obstruction. No abscess in the abdomen or pelvis. Appendix appears normal. 3. No evident renal or ureteral calculus. No hydronephrosis. Urinary bladder wall thickness within normal limits. 4.  Small ventral hernia containing only fat.  Small hiatal hernia. 5.  Gallbladder absent. Electronically Signed   By: Bretta BangWilliam  Woodruff III M.D.   On: 04/10/2019 08:23    Procedures Procedures (including critical care time)  Medications Ordered  in ED Medications  dexamethasone (DECADRON) tablet 8 mg (8 mg Oral Given 04/10/19 0807)  LORazepam (ATIVAN) tablet 1 mg (1 mg Oral Given 04/10/19 0807)  ibuprofen (ADVIL) tablet 400 mg (400 mg Oral Given 04/10/19 0807)     Initial Impression / Assessment and Plan / ED Course  I have reviewed the triage vital signs and the nursing notes.  Pertinent labs & imaging results that were available during my care of the patient were reviewed by me and considered in my medical decision making (see chart for details).   53yF with R back/flank pain. Ovarian cyst? CT as above. Timeline seems long though. Afebrile. Non toxic. Abdomen is benign. She is s/p cholecystectomy. Plan symptomatic tx at this time. OB/GYN FU. PRN pain meds. May need US.   Final Clinical Impressions(s) / ED Diagnoses   Final diagnoses:  Acute right-sided low back pain without sciatica    ED Discharge Orders    None       Raeford RazorKohut, Marlayna Bannister, MD 04/13/19 [phone]

## 2019-04-10 NOTE — ED Triage Notes (Signed)
Pt reports low back pain for past 2 weeks, worse when getting out of bed this morning, able to ambulate with steady gait, denies numbness/tingling to extremities.  Also describes right sided abdominal pain, denies n/v/d.

## 2019-04-11 ENCOUNTER — Other Ambulatory Visit: Payer: Self-pay

## 2019-04-13 ENCOUNTER — Other Ambulatory Visit: Payer: Self-pay

## 2019-04-13 ENCOUNTER — Ambulatory Visit: Payer: BC Managed Care – PPO | Admitting: Obstetrics & Gynecology

## 2019-04-13 ENCOUNTER — Encounter: Payer: Self-pay | Admitting: Obstetrics & Gynecology

## 2019-04-13 ENCOUNTER — Emergency Department (HOSPITAL_BASED_OUTPATIENT_CLINIC_OR_DEPARTMENT_OTHER)
Admission: EM | Admit: 2019-04-13 | Discharge: 2019-04-13 | Disposition: A | Discharge status: Home / Self Care | Payer: BC Managed Care – PPO | Attending: Emergency Medicine | Admitting: Emergency Medicine

## 2019-04-13 ENCOUNTER — Encounter (HOSPITAL_BASED_OUTPATIENT_CLINIC_OR_DEPARTMENT_OTHER): Payer: Self-pay | Admitting: Emergency Medicine

## 2019-04-13 VITALS — BP 132/78 | Ht 65.25 in | Wt 242.0 lb

## 2019-04-13 DIAGNOSIS — N920 Excessive and frequent menstruation with regular cycle: Secondary | ICD-10-CM | POA: Diagnosis not present

## 2019-04-13 DIAGNOSIS — N83209 Unspecified ovarian cyst, unspecified side: Secondary | ICD-10-CM

## 2019-04-13 DIAGNOSIS — I10 Essential (primary) hypertension: Secondary | ICD-10-CM | POA: Diagnosis not present

## 2019-04-13 DIAGNOSIS — R1031 Right lower quadrant pain: Secondary | ICD-10-CM | POA: Diagnosis not present

## 2019-04-13 DIAGNOSIS — Z79899 Other long term (current) drug therapy: Secondary | ICD-10-CM | POA: Insufficient documentation

## 2019-04-13 DIAGNOSIS — Z7982 Long term (current) use of aspirin: Secondary | ICD-10-CM | POA: Insufficient documentation

## 2019-04-13 DIAGNOSIS — Z789 Other specified health status: Secondary | ICD-10-CM | POA: Diagnosis not present

## 2019-04-13 DIAGNOSIS — R1013 Epigastric pain: Secondary | ICD-10-CM

## 2019-04-13 LAB — CBC WITH DIFFERENTIAL/PLATELET
Abs Immature Granulocytes: 0.01 10*3/uL (ref 0.00–0.07)
Basophils Absolute: 0.1 10*3/uL (ref 0.0–0.1)
Basophils Relative: 1 %
Eosinophils Absolute: 0.1 10*3/uL (ref 0.0–0.5)
Eosinophils Relative: 2 %
HCT: 41.6 % (ref 36.0–46.0)
Hemoglobin: 13.1 g/dL (ref 12.0–15.0)
Immature Granulocytes: 0 %
Lymphocytes Relative: 32 %
Lymphs Abs: 2.1 10*3/uL (ref 0.7–4.0)
MCH: 27.3 pg (ref 26.0–34.0)
MCHC: 31.5 g/dL (ref 30.0–36.0)
MCV: 86.8 fL (ref 80.0–100.0)
Monocytes Absolute: 0.6 10*3/uL (ref 0.1–1.0)
Monocytes Relative: 10 %
Neutro Abs: 3.5 10*3/uL (ref 1.7–7.7)
Neutrophils Relative %: 55 %
Platelets: 151 10*3/uL (ref 150–400)
RBC: 4.79 MIL/uL (ref 3.87–5.11)
RDW: 14.9 % (ref 11.5–15.5)
WBC: 6.5 10*3/uL (ref 4.0–10.5)
nRBC: 0 % (ref 0.0–0.2)

## 2019-04-13 LAB — LIPASE, BLOOD: Lipase: 38 U/L (ref 11–51)

## 2019-04-13 LAB — COMPREHENSIVE METABOLIC PANEL
ALT: 28 U/L (ref 0–44)
AST: 21 U/L (ref 15–41)
Albumin: 4.2 g/dL (ref 3.5–5.0)
Alkaline Phosphatase: 47 U/L (ref 38–126)
Anion gap: 11 (ref 5–15)
BUN: 18 mg/dL (ref 6–20)
CO2: 24 mmol/L (ref 22–32)
Calcium: 9 mg/dL (ref 8.9–10.3)
Chloride: 103 mmol/L (ref 98–111)
Creatinine, Ser: 0.74 mg/dL (ref 0.44–1.00)
GFR calc Af Amer: >60 mL/min (ref >60)
GFR calc non Af Amer: >60 mL/min (ref >60)
Glucose, Bld: 108 mg/dL — ABNORMAL HIGH (ref 70–99)
Potassium: 3.9 mmol/L (ref 3.5–5.1)
Sodium: 138 mmol/L (ref 135–145)
Total Bilirubin: 0.4 mg/dL (ref 0.3–1.2)
Total Protein: 7.2 g/dL (ref 6.5–8.1)

## 2019-04-13 LAB — URINALYSIS, ROUTINE W REFLEX MICROSCOPIC
Bilirubin Urine: NEGATIVE
Glucose, UA: NEGATIVE mg/dL
Hgb urine dipstick: NEGATIVE
Ketones, ur: 15 mg/dL — AB
Leukocytes,Ua: NEGATIVE
Nitrite: NEGATIVE
Protein, ur: NEGATIVE mg/dL
Specific Gravity, Urine: 1.025 (ref 1.005–1.030)
pH: 5.5 (ref 5.0–8.0)

## 2019-04-13 NOTE — ED Notes (Signed)
Pt made aware that we need a urine sample. Unable to obtain at this time

## 2019-04-13 NOTE — ED Notes (Signed)
ED Provider at bedside. 

## 2019-04-13 NOTE — Progress Notes (Signed)
Ruth Matthews [birth year] [id number]   History:    53 y.o. G2P2L2 Married.  Delivered her daughter who is now 51 yo.  RP:  Abdominal pain persisting post ruptured ovarian cyst on 04/10/2019  HPI:  Had severe Rt pelvic pain which brought her to the ER on 04/10/2019.  A CT Scan was c/w a recent Ovarian cyst rupture, showing FF in the Rt pelvis, no adnexal cyst/mass were seen.  Since then, patient has had less severe, but persistent pelvic and last night abdominal pain.  No UTI Sx.  No fever.  No SOB, no dizziness, no fainting.  Uses condoms for contraception.  Menses every month, with mildly increased flow.  No BTB.  Past medical history,surgical history, family history and social history were all reviewed and documented in the EPIC chart.  Gynecologic History Patient's last menstrual period was 03/09/2019. Contraception: condoms Last Pap: Normal paps per patient Last mammogram: 08/2017. Results were: Benign  Obstetric History OB History  Gravida Para Term Preterm AB Living  2 2       2   SAB TAB Ectopic Multiple Live Births               # Outcome Date GA Lbr Len/2nd Weight Sex Delivery Anes PTL Lv  2 Para           1 Para            ROS negative   Exam:   BP 132/78   Ht 5' 5.25" (1.657 m)   Wt 242 lb (109.8 kg)   LMP 03/09/2019   BMI 39.96 kg/m   Body mass index is 39.96 kg/m.  General appearance : Well developed well nourished female. No acute distress  Abdomen: no palpable masses or tenderness, no rebound or guarding  Pelvic: Vulva: Normal             Vagina: No gross lesions or discharge  Cervix: No gross lesions or discharge  Uterus  AV, normal size, shape and consistency, non-tender and mobile  Adnexa  Without masses or tenderness  Anus: Normal  CT Scan 04/10/2019:  Reproductive: The uterus is anteverted. There is a probable nabothian cyst in the cervix measuring 8 x 7 mm. There is loculated fluid tracking from the right adnexa posterior to the uterus.  No adnexal mass is demonstrable.  Appendix appears unremarkable. There is no abscess or ascites in the abdomen or pelvis. There is a small ventral hernia containing only fat.  IMPRESSION: 1. Loculated fluid tracking from the right adnexa posterior to the uterus on the right. Suspect recent ovarian cyst rupture, likely with hemorrhage. No adnexal mass seen in this area.  2. No bowel obstruction. No abscess in the abdomen or pelvis. Appendix appears normal.  3. No evident renal or ureteral calculus. No hydronephrosis. Urinary bladder wall thickness within normal limits.  4.  Small ventral hernia containing only fat.  Small hiatal hernia.  5.  Gallbladder absent.    Assessment/Plan:  53 y.o. female for annual exam   1. Ruptured ovarian cyst Recent history of probably right ovarian cyst rupture per clinical history and CT scan findings on April 10, 2019.  Patient still experiencing abdominal pain probably due to the abdominal/pelvic fluid seen on CT scan from the ruptured cyst.  Recommend to continue ibuprofen regularly for a few days.  No acute abdomen on exam today.  Patient reassured.  Will follow-up with a pelvic ultrasound to further evaluate patient's ovaries.  The possibility of  starting on the progestin only birth control pill discussed with patient.  Will decide at follow-up. - US Transvaginal Non-OB; Future  2. Right lower quadrant abdominal pain As above. - US Transvaginal Non-OB; Future  3. Menorrhagia with regular cycle Heavier regular menstrual periods in the past year.  Follow-up pelvic ultrasound to evaluate the uterus and endometrium.  May start on the progestin only birth control pill to decrease the menstrual flow.  Will decide at the follow-up visit. - US Transvaginal Non-OB; Future  4. Use of condoms for contraception Condoms as needed for contraception.  May add a progestin only birth control pill for better contraception.  Counseling on above issues and  coordination of care more than 50% for 30 minutes.  Princess Bruins MD, 10:40 AM 04/13/2019

## 2019-04-13 NOTE — Patient Instructions (Addendum)
1. Ruptured ovarian cyst Recent history of probably right ovarian cyst rupture per clinical history and CT scan findings on April 10, 2019.  Patient still experiencing abdominal pain probably due to the abdominal/pelvic fluid seen on CT scan from the ruptured cyst.  Recommend to continue ibuprofen regularly for a few days.  No acute abdomen on exam today.  Patient reassured.  Will follow-up with a pelvic ultrasound to further evaluate patient's ovaries.  The possibility of starting on the progestin only birth control pill discussed with patient.  Will decide at follow-up. - US Transvaginal Non-OB; Future  2. Right lower quadrant abdominal pain As above. - US Transvaginal Non-OB; Future  3. Menorrhagia with regular cycle Heavier regular menstrual periods in the past year.  Follow-up pelvic ultrasound to evaluate the uterus and endometrium.  May start on the progestin only birth control pill to decrease the menstrual flow.  Will decide at the follow-up visit. - US Transvaginal Non-OB; Future  4. Use of condoms for contraception Condoms as needed for contraception.  May add a progestin only birth control pill for better contraception.   Kinzee, it was a pleasure seeing you today!

## 2019-04-13 NOTE — ED Provider Notes (Signed)
[facility] Provider Note: [name], MD, FACEP  CSN: [id number] MRN: [id number] ARRIVAL: 04/13/19 at [place]: [place]  Abdominal Pain   HISTORY OF PRESENT ILLNESS  04/13/19 5:14 AM Ruth Matthews is a 53 y.o. female who was seen on June 22 for low back pain for 2 weeks and right-sided abdominal pain.  She had no associated nausea, vomiting or diarrhea with it.  A CT scan was obtained which showed loculated fluid tracking from the right adnexa posteriorly to the uterus on the right.  She was diagnosed with a suspected ruptured right ovarian cyst, although ovaries appeared unremarkable on the CT.  She is here with abdominal pain that awakened her from sleep about 90 minutes ago.  Pain is in her epigastrium and comes in waves.  It is minimal at the present time but becomes severe periodically.  It is somewhat worse with movement or palpation.  She also feels the pain in her back but cannot say if it radiates around the left to her back or radiates straight through to the back.  When the waves hit she feels nauseated but is not presently nauseated.  She denies vomiting or diarrhea.  She denies fever but has had chills.   Past Medical History:  Diagnosis Date  . DVT (deep venous thrombosis) ([place])   . Elevated fasting blood sugar   . Gestational diabetes 2000  . Hypertension   . 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 Marlou Porch, Dr. Caryl Comes  . RMS[facility] spotted fever) 2004   history of   . Varicose veins   . Ventricular tachycardia ([place])   . Vitamin D deficiency     Past Surgical History:  Procedure Laterality Date  . CARDIAC CATHETERIZATION     no CAD  . CESAREAN SECTION    . CHOLECYSTECTOMY    . ENDOVENOUS ABLATION SAPHENOUS VEIN W/ LASER Bilateral 02/2011   Both legs a few weeks apart    Family History  Problem Relation Age of Onset  . Hypertension Mother   . COPD Mother   . Other Father        died from  accident, h/o lyme disease    Social History   Tobacco Use  . Smoking status: Never Smoker  . Smokeless tobacco: Never Used  Substance Use Topics  . Alcohol use: Yes    Alcohol/week: 0.0 standard drinks    Comment: occasionall  . Drug use: No    Prior to Admission medications   Medication Sig Start Date End Date Taking? Authorizing Provider  acetaminophen (TYLENOL) 325 MG tablet Take 650 mg by mouth every 6 (six) hours as needed.    [provider]  aspirin EC 81 MG tablet Take 1 tablet (81 mg total) by mouth daily. 09/30/18   Dorothy Spark, MD  Cholecalciferol (VITAMIN D) 50 MCG (2000 UT) CAPS Take 4,000 Int'l Units by mouth daily.    [provider]  Eszopiclone 3 MG TABS Take 1 tablet by mouth at bedtime as needed for sleep. 08/01/18   [provider]  fexofenadine (ALLEGRA) 180 MG tablet Take 1 tablet by mouth daily.    [provider]  fluticasone (FLONASE) 50 MCG/ACT nasal spray Place 1 spray into both nostrils daily.    [provider]  HYDROcodone-acetaminophen (NORCO/VICODIN) 5-325 MG tablet Take 1 tablet by mouth every 6 (six) hours as needed. 04/10/19   Virgel Manifold, MD  metoprolol tartrate (LOPRESSOR)  100 MG tablet Take 1 tablet by mouth 2 hours prior to Cardiac CT 09/30/18   Lars MassonNelson, Katarina H, MD  verapamil (CALAN-SR) 240 MG CR tablet TAKE 1 TABLET DAILY 03/19/17   Jake BatheSkains, Mark C, MD    Allergies Azithromycin and Levaquin [levofloxacin]   REVIEW OF SYSTEMS  Negative except as noted here or in the History of Present Illness.   PHYSICAL EXAMINATION  Initial Vital Signs Blood pressure (!) 147/70, pulse 64, temperature 97.6 F (36.4 C), temperature source Oral, resp. rate 18, height 5\' 7"  (1.702 m), weight 108.9 kg, SpO2 98 %.  Examination General: Well-developed, well-nourished female in no acute distress; appearance consistent with age of record HENT: normocephalic; atraumatic Eyes: Normal appearance Neck:  supple Heart: regular rate and rhythm Lungs: clear to auscultation bilaterally Abdomen: soft; nondistended; mild diffuse tenderness most prominent in the epigastrium; no masses or hepatosplenomegaly; bowel sounds present Extremities: No deformity; full range of motion; pulses normal Neurologic: Awake, alert and oriented; motor function intact in all extremities and symmetric; no facial droop Skin: Warm and dry Psychiatric: Normal mood and affect   RESULTS  Summary of this visit's results, reviewed by myself:   EKG Interpretation  Date/Time:    Ventricular Rate:    PR Interval:    QRS Duration:   QT Interval:    QTC Calculation:   R Axis:     Text Interpretation:        Laboratory Studies: Results for orders placed or performed during the hospital encounter of 04/13/19 (from the past 24 hour(s))  CBC with Differential/Platelet     Status: None   Collection Time: 04/13/19  5:15 AM  Result Value Ref Range   WBC 6.5 4.0 - 10.5 K/uL   RBC 4.79 3.87 - 5.11 MIL/uL   Hemoglobin 13.1 12.0 - 15.0 g/dL   HCT 16.141.6 09.636.0 - 04.546.0 %   MCV 86.8 80.0 - 100.0 fL   MCH 27.3 26.0 - 34.0 pg   MCHC 31.5 30.0 - 36.0 g/dL   RDW 40.914.9 81.111.5 - 91.415.5 %   Platelets 151 150 - 400 K/uL   nRBC 0.0 0.0 - 0.2 %   Neutrophils Relative % 55 %   Neutro Abs 3.5 1.7 - 7.7 K/uL   Lymphocytes Relative 32 %   Lymphs Abs 2.1 0.7 - 4.0 K/uL   Monocytes Relative 10 %   Monocytes Absolute 0.6 0.1 - 1.0 K/uL   Eosinophils Relative 2 %   Eosinophils Absolute 0.1 0.0 - 0.5 K/uL   Basophils Relative 1 %   Basophils Absolute 0.1 0.0 - 0.1 K/uL   Immature Granulocytes 0 %   Abs Immature Granulocytes 0.01 0.00 - 0.07 K/uL  Comprehensive metabolic panel     Status: Abnormal   Collection Time: 04/13/19  5:15 AM  Result Value Ref Range   Sodium 138 135 - 145 mmol/L   Potassium 3.9 3.5 - 5.1 mmol/L   Chloride 103 98 - 111 mmol/L   CO2 24 22 - 32 mmol/L   Glucose, Bld 108 (H) 70 - 99 mg/dL   BUN 18 6 - 20 mg/dL    Creatinine, Ser 7.820.74 0.44 - 1.00 mg/dL   Calcium 9.0 8.9 - 95.610.3 mg/dL   Total Protein 7.2 6.5 - 8.1 g/dL   Albumin 4.2 3.5 - 5.0 g/dL   AST 21 15 - 41 U/L   ALT 28 0 - 44 U/L   Alkaline Phosphatase 47 38 - 126 U/L   Total Bilirubin 0.4 0.3 -  1.2 mg/dL   GFR calc non Af Amer >60 >60 mL/min   GFR calc Af Amer >60 >60 mL/min   Anion gap 11 5 - 15  Lipase, blood     Status: None   Collection Time: 04/13/19  5:15 AM  Result Value Ref Range   Lipase 38 11 - 51 U/L  Urinalysis, Routine w reflex microscopic     Status: Abnormal   Collection Time: 04/13/19  6:03 AM  Result Value Ref Range   Color, Urine YELLOW YELLOW   APPearance HAZY (A) CLEAR   Specific Gravity, Urine 1.025 1.005 - 1.030   pH 5.5 5.0 - 8.0   Glucose, UA NEGATIVE NEGATIVE mg/dL   Hgb urine dipstick NEGATIVE NEGATIVE   Bilirubin Urine NEGATIVE NEGATIVE   Ketones, ur 15 (A) NEGATIVE mg/dL   Protein, ur NEGATIVE NEGATIVE mg/dL   Nitrite NEGATIVE NEGATIVE   Leukocytes,Ua NEGATIVE NEGATIVE   Imaging Studies: No results found.  ED COURSE and MDM  Nursing notes and initial vitals signs, including pulse oximetry, reviewed.  Vitals:   04/13/19 0511 04/13/19 0643  BP: (!) 147/70 (!) 113/57  Pulse: 64 (!) 58  Resp: 18   Temp: 97.6 F (36.4 C)   TempSrc: Oral   SpO2: 98% 99%  Weight: 108.9 kg   Height: 5\' 7"  (1.702 m)    7:07 AM Patient having some mild epigastric pain but not as severe as earlier.  As noted above she had a CT scan several days ago which suggested a ruptured ovarian cyst.  I do not believe a repeat CT scan is indicated at this time.  The patient has follow-up with her OB/GYN this morning at 1015 and she intends to follow-up with that appointment.  She was advised they may choose to perform an ultrasound of her pelvis; we do not have ultrasound available here until after 9 AM and there are already several appointments booked for the 9 AM.    PROCEDURES    ED DIAGNOSES     ICD-10-CM   1. Epigastric  pain  R10.13        Justa Hatchell, Jonny RuizJohn, MD 04/13/19 [phone]

## 2019-04-13 NOTE — ED Triage Notes (Signed)
Pt states that she was here Monday and dx with ruptured ovarian cyst. Today pain is worse and woke her around 0315. Upper left abdomen and going to her back. Nausea, diaphoretic, and chills +. Denies urinary symptoms.

## 2019-04-13 NOTE — ED Notes (Signed)
Pt attempting to obtain a urine sample

## 2019-05-04 ENCOUNTER — Ambulatory Visit: Payer: Self-pay | Admitting: Obstetrics & Gynecology

## 2019-05-05 ENCOUNTER — Other Ambulatory Visit: Payer: Self-pay

## 2019-05-08 ENCOUNTER — Ambulatory Visit: Payer: BC Managed Care – PPO

## 2019-05-08 ENCOUNTER — Ambulatory Visit: Payer: BC Managed Care – PPO | Admitting: Obstetrics & Gynecology

## 2019-05-08 ENCOUNTER — Other Ambulatory Visit: Payer: BC Managed Care – PPO

## 2019-05-08 ENCOUNTER — Telehealth: Payer: Self-pay | Admitting: *Deleted

## 2019-05-08 ENCOUNTER — Encounter: Payer: Self-pay | Admitting: Obstetrics & Gynecology

## 2019-05-08 ENCOUNTER — Other Ambulatory Visit: Payer: Self-pay

## 2019-05-08 ENCOUNTER — Ambulatory Visit (INDEPENDENT_AMBULATORY_CARE_PROVIDER_SITE_OTHER): Payer: BC Managed Care – PPO | Admitting: Obstetrics & Gynecology

## 2019-05-08 VITALS — BP 136/82

## 2019-05-08 DIAGNOSIS — R1011 Right upper quadrant pain: Secondary | ICD-10-CM

## 2019-05-08 DIAGNOSIS — N920 Excessive and frequent menstruation with regular cycle: Secondary | ICD-10-CM

## 2019-05-08 DIAGNOSIS — N83209 Unspecified ovarian cyst, unspecified side: Secondary | ICD-10-CM | POA: Diagnosis not present

## 2019-05-08 DIAGNOSIS — R1031 Right lower quadrant pain: Secondary | ICD-10-CM

## 2019-05-08 MED ORDER — NORETHINDRONE 0.35 MG PO TABS: 1.0000 | ORAL_TABLET | Freq: Every day | ORAL | 4 refills | Status: DC

## 2019-05-08 NOTE — Telephone Encounter (Signed)
-----   Message from Princess Bruins, MD sent at 05/08/2019  9:41 AM EDT ----- Regarding: Refer to General surgery RUQ pain.  S/P Cholecystectomy.  Mildly dilated CBD on [name].  Recent CT scan.  Severe pain x 6 weeks, urgent referral.

## 2019-05-08 NOTE — Progress Notes (Signed)
    Ruth Matthews November 05, 1965 [id number]        53 y.o.  G2P2L2   RP:  Right ovarian cyst rupture for Pelvic US  HPI: No pelvic pain anymore.  RUQ pain remains x 6 weeks, severe at times.  H/O Cholecystectomy.  Menses regular normal monthly.  Using condoms.  No vaginal discharge.  Urine/BMs normal.  No fever.   OB History  Gravida Para Term Preterm AB Living  2 2       2   SAB TAB Ectopic Multiple Live Births               # Outcome Date GA Lbr Len/2nd Weight Sex Delivery Anes PTL Lv  2 Para           1 Para             Past medical history,surgical history, problem list, medications, allergies, family history and social history were all reviewed and documented in the EPIC chart.   Directed ROS with pertinent positives and negatives documented in the history of present illness/assessment and plan.  Exam:  Vitals:   05/08/19 0918  BP: 136/82   General appearance:  Normal  Pelvic US today: T/V and T/A images.  Anteverted uterus measuring 7.99 x 5.44 x 4.29 cm.  Very small IM Myoma 7 x 8 mm.  Cortical cysts in the myometrium.  Endometrial line thin and normal at 2.8 mm.  Right ovary normal.  Left ovary normal.  No free fluid in the posterior cul-de-sac.  RUQ CBD at 10 mm.  H/O Cholecystectomy.     Assessment/Plan:  53 y.o. G2P2   1. Ruptured ovarian cyst Resolved.  Pelvic US findings reviewed with patient.  Uterus is normal with a very small intramural myoma measuring 8 mm.  The endometrial lining is thin and normal at 2.8 mm.  Right and left ovaries normal.  No free fluid in the posterior cutis sac.  2. Right upper quadrant pain Status post cholecystectomy.  Persistent right upper quadrant pain, at times severe.  Ultrasound evaluation of the right upper quadrant revealed a common bile duct at 10 mm.  Decision to refer patient to General surgery.  3. Menorrhagia with regular cycle Patient continues to have heavy menstrual period every month.  Decision to control with the  progestin only birth control pill.  Usage reviewed.  Prescription sent to pharmacy.  Other orders - norethindrone (MICRONOR) 0.35 MG tablet; Take 1 tablet (0.35 mg total) by mouth daily.  Counseling on above issues and coordination of care more than 50% for 15 minutes.  Princess Bruins MD, 9:43 AM 05/08/2019

## 2019-05-08 NOTE — Telephone Encounter (Signed)
Referral placed in [place] at [facility], they will call to schedule. 

## 2019-05-09 ENCOUNTER — Encounter: Payer: Self-pay | Admitting: Obstetrics & Gynecology

## 2019-05-09 NOTE — Patient Instructions (Signed)
1. Ruptured ovarian cyst Resolved.  Pelvic US findings reviewed with patient.  Uterus is normal with a very small intramural myoma measuring 8 mm.  The endometrial lining is thin and normal at 2.8 mm.  Right and left ovaries normal.  No free fluid in the posterior cutis sac.  2. Right upper quadrant pain Status post cholecystectomy.  Persistent right upper quadrant pain, at times severe.  Ultrasound evaluation of the right upper quadrant revealed a common bile duct at 10 mm.  Decision to refer patient to General surgery.  3. Menorrhagia with regular cycle Patient continues to have heavy menstrual period every month.  Decision to control with the progestin only birth control pill.  Usage reviewed.  Prescription sent to pharmacy.  Other orders - norethindrone (MICRONOR) 0.35 MG tablet; Take 1 tablet (0.35 mg total) by mouth daily.  Kischa, it was a pleasure seeing you today!

## 2019-05-09 NOTE — Telephone Encounter (Signed)
Appt 05/15/19 3:00 Dr.cornett

## 2019-05-11 ENCOUNTER — Ambulatory Visit: Payer: Self-pay | Admitting: Obstetrics & Gynecology

## 2019-05-17 ENCOUNTER — Other Ambulatory Visit: Payer: Self-pay | Admitting: Surgery

## 2019-05-17 DIAGNOSIS — Z86718 Personal history of other venous thrombosis and embolism: Secondary | ICD-10-CM

## 2019-05-18 ENCOUNTER — Other Ambulatory Visit: Payer: Self-pay | Admitting: Surgery

## 2019-05-18 DIAGNOSIS — R1011 Right upper quadrant pain: Secondary | ICD-10-CM

## 2019-05-26 ENCOUNTER — Other Ambulatory Visit: Payer: Self-pay | Admitting: Surgery

## 2019-05-29 ENCOUNTER — Ambulatory Visit
Admission: RE | Admit: 2019-05-29 | Discharge: 2019-05-29 | Disposition: A | Discharge status: Home / Self Care | Payer: BC Managed Care – PPO | Source: Ambulatory Visit | Attending: Surgery | Admitting: Surgery

## 2019-05-29 ENCOUNTER — Other Ambulatory Visit: Payer: Self-pay | Admitting: Surgery

## 2019-05-29 DIAGNOSIS — Z86718 Personal history of other venous thrombosis and embolism: Secondary | ICD-10-CM

## 2019-05-29 MED ORDER — IOPAMIDOL (ISOVUE-370) INJECTION 76%
80.0000 mL | Freq: Once | INTRAVENOUS | Status: AC | PRN
  Administered 2019-05-29: 80 mL via INTRAVENOUS

## 2019-06-17 ENCOUNTER — Other Ambulatory Visit: Payer: Self-pay

## 2019-06-17 ENCOUNTER — Ambulatory Visit
Admission: RE | Admit: 2019-06-17 | Discharge: 2019-06-17 | Disposition: A | Discharge status: Home / Self Care | Payer: BC Managed Care – PPO | Source: Ambulatory Visit | Attending: Surgery | Admitting: Surgery

## 2019-06-17 DIAGNOSIS — R1011 Right upper quadrant pain: Secondary | ICD-10-CM

## 2019-06-17 MED ORDER — GADOBENATE DIMEGLUMINE 529 MG/ML IV SOLN
19.0000 mL | Freq: Once | INTRAVENOUS | Status: AC | PRN
  Administered 2019-06-17: 19 mL via INTRAVENOUS

## 2019-06-28 ENCOUNTER — Other Ambulatory Visit: Payer: Self-pay | Admitting: Surgery

## 2019-06-28 DIAGNOSIS — R1011 Right upper quadrant pain: Secondary | ICD-10-CM

## 2019-07-04 ENCOUNTER — Ambulatory Visit
Admission: RE | Admit: 2019-07-04 | Discharge: 2019-07-04 | Disposition: A | Discharge status: Home / Self Care | Payer: BC Managed Care – PPO | Source: Ambulatory Visit | Attending: Surgery | Admitting: Surgery

## 2019-07-04 DIAGNOSIS — R1011 Right upper quadrant pain: Secondary | ICD-10-CM

## 2019-07-07 ENCOUNTER — Encounter: Payer: Self-pay | Admitting: Gastroenterology

## 2019-08-02 ENCOUNTER — Other Ambulatory Visit: Payer: Self-pay

## 2019-08-02 ENCOUNTER — Ambulatory Visit (INDEPENDENT_AMBULATORY_CARE_PROVIDER_SITE_OTHER): Payer: BC Managed Care – PPO | Admitting: Gastroenterology

## 2019-08-02 ENCOUNTER — Encounter: Payer: Self-pay | Admitting: Gastroenterology

## 2019-08-02 VITALS — BP 128/78 | HR 96 | Temp 98.4°F | Ht 66.0 in | Wt 261.5 lb

## 2019-08-02 DIAGNOSIS — R1011 Right upper quadrant pain: Secondary | ICD-10-CM

## 2019-08-02 MED ORDER — OMEPRAZOLE 20 MG PO CPDR: 20.0000 mg | DELAYED_RELEASE_CAPSULE | Freq: Every day | ORAL | 6 refills | Status: DC

## 2019-08-02 NOTE — Progress Notes (Signed)
Chief Complaint:   Referring Provider:  Harriette Bouillonornett, Thomas, MD      ASSESSMENT AND PLAN;   #1. RUQ pain. ? Etiology. Likely musculoskeltal. R/O other causes. Neg UGI series except small HH 07/04/2019, neg CT and then MRI/MRCP 06/17/2019 except for small hepatic cysts and fatty liver.  #2. GERD with small HH  #3. Fatty liver with Nl LFTs   Plan: - Omeprazole 20mg  po qd, 30, 6 refills - Biofreeze or icy hot in AM, heating pads at night to take care of musculoskeletal component.  If she continues to have point tenderness, can consider local steroid injection. - Colace 1 tab po qd. - Gradual weight loss as the treatment for fatty liver. - Call in 2 weeks. -I have recommended EGD/screening colonoscopy.  However, she would like to wait for now as she " had too many tests".  - FU in 12 weeks.  At FU, would again talk to her about endoscopic procedures especially screening colonoscopy (age>50) - MRI reviewed with the patient.    HPI:    Ruth Matthews is a 53 y.o. female   RUQ pain x 2 yrs- achy, sharp at times, GB out 1994, worse after larger, fatty meals, bumpy rides, better with tylenol Associated indigestion and heartburn.  Occasional nausea but no vomiting.  Occ constipation with passage of pellet-like stools, lot of gas at times.  Better by increasing water intake.  No melena or hematochezia.  No weight loss.  No jaundice dark urine or pale stools.  She had extensive work-up including normal CBC, CMP, lipase.  Neg UGI series except small HH 07/04/2019, neg CT and then MRI/MRCP 06/17/2019 except for small hepatic cysts and fatty liver.  Had been on omeprazole in the past which did help previously.  No nonsteroidals.  Past Medical History:  Diagnosis Date  . Anxiety   . DVT (deep venous thrombosis) (HCC)   . Elevated fasting blood sugar   . Gallstone   . Gestational diabetes 2000  . Hypertension   . 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 Anne FuSkains, Dr. Graciela HusbandsKlein   . Obesity   . Ovarian cyst    ruptured  . RMSF [facility]([place] spotted fever) 2004   history of   . Varicose veins   . Ventricular tachycardia (HCC)   . Vitamin D deficiency     Past Surgical History:  Procedure Laterality Date  . CARDIAC CATHETERIZATION     no CAD  . CESAREAN SECTION    . CHOLECYSTECTOMY    . ENDOVENOUS ABLATION SAPHENOUS VEIN W/ LASER Bilateral 02/2011   Both legs a few weeks apart    Family History  Problem Relation Age of Onset  . Hypertension Mother   . COPD Mother   . Other Father        died from accident, h/o lyme disease  . Colon cancer Neg Hx   . Esophageal cancer Neg Hx     Social History   Tobacco Use  . Smoking status: Never Smoker  . Smokeless tobacco: Never Used  Substance Use Topics  . Alcohol use: Yes    Alcohol/week: 0.0 standard drinks    Comment: occasionall  . Drug use: No    Current Outpatient Medications  Medication Sig Dispense Refill  . acetaminophen (TYLENOL) 325 MG tablet Take 650 mg by mouth every 6 (six) hours as needed.    Marland Kitchen. aspirin EC 81 MG tablet Take 1 tablet (81 mg total) by mouth daily. 90  tablet 3  . Cholecalciferol (VITAMIN D) 50 MCG (2000 UT) CAPS Take 4,000 Int'l Units by mouth daily.    . fexofenadine (ALLEGRA) 180 MG tablet Take 1 tablet by mouth daily.    . fluticasone (FLONASE) 50 MCG/ACT nasal spray Place 1 spray into both nostrils daily.    . verapamil (CALAN-SR) 240 MG CR tablet TAKE 1 TABLET DAILY 30 tablet 0   No current facility-administered medications for this visit.     Allergies  Allergen Reactions  . Latex Itching, Swelling and Rash  . Azithromycin     Upset stomach   . Levaquin [Levofloxacin]     Review of Systems:  Constitutional: Denies fever, chills, diaphoresis, appetite change and fatigue.  HEENT: Denies photophobia, eye pain, redness, hearing loss, ear pain, congestion, sore throat, rhinorrhea, sneezing, mouth sores, neck pain, neck stiffness and tinnitus.   Respiratory:  Denies SOB, DOE, cough, chest tightness,  and wheezing.   Cardiovascular: Denies chest pain, palpitations and leg swelling.  Genitourinary: Denies dysuria, urgency, frequency, hematuria, flank pain and difficulty urinating.  Musculoskeletal: Denies myalgias, back pain, joint swelling, arthralgias and gait problem.  Skin: No rash.  Neurological: Denies dizziness, seizures, syncope, weakness, light-headedness, numbness and headaches.  Hematological: Denies adenopathy. Easy bruising, personal or family bleeding history  Psychiatric/Behavioral: No anxiety or depression     Physical Exam:    BP 128/78   Pulse 96   Temp 98.4 F (36.9 C)   Ht 5\' 6"  (1.676 m)   Wt 261 lb 8 oz (118.6 kg)   BMI 42.21 kg/m  Filed Weights   08/02/19 0846  Weight: 261 lb 8 oz (118.6 kg)   Constitutional:  Well-developed, in no acute distress. Psychiatric: Normal mood and affect. Behavior is normal. HEENT: Pupils normal.  Conjunctivae are normal. No scleral icterus. Neck supple.  Cardiovascular: Normal rate, regular rhythm. No edema Pulmonary/chest: Effort normal and breath sounds normal. No wheezing, rales or rhonchi. Abdominal: Soft, nondistended.  Right upper quadrant tenderness mostly musculoskeletal, increased by raising legs on contraction of rectus abdominis muscle.  Bowel sounds active throughout. There are no masses palpable. No hepatomegaly. Rectal:  defered Neurological: Alert and oriented to person place and time. Skin: Skin is warm and dry. No rashes noted.  Data Reviewed: I have personally reviewed following labs and imaging studies  CBC: CBC Latest Ref Rng & Units 04/13/2019 09/30/2018 03/02/2014  WBC 4.0 - 10.5 K/uL 6.5 5.0 7.0  Hemoglobin 12.0 - 15.0 g/dL 13.1 13.0 12.4  Hematocrit 36.0 - 46.0 % 41.6 39.9 37.8  Platelets 150 - 400 K/uL 151 199 242.0    CMP: CMP Latest Ref Rng & Units 04/13/2019 04/10/2019 09/30/2018  Glucose 70 - 99 mg/dL 108(H) 106(H) 104(H)  BUN 6 - 20 mg/dL 18 13 12    Creatinine 0.44 - 1.00 mg/dL 0.74 0.80 0.72  Sodium 135 - 145 mmol/L 138 138 142  Potassium 3.5 - 5.1 mmol/L 3.9 4.0 4.5  Chloride 98 - 111 mmol/L 103 108 103  CO2 22 - 32 mmol/L 24 21(L) 23  Calcium 8.9 - 10.3 mg/dL 9.0 8.8(L) 9.5  Total Protein 6.5 - 8.1 g/dL 7.2 7.4 7.1  Total Bilirubin 0.3 - 1.2 mg/dL 0.4 0.6 0.4  Alkaline Phos 38 - 126 U/L 47 43 56  AST 15 - 41 U/L 21 14(L) 12  ALT 0 - 44 U/L 28 22 15     GFR  Radiology Studies: Dg Ugi W Double Cm (hd Ba)  Result Date: 07/04/2019 CLINICAL DATA:  Right upper quadrant abdominal pain. History of cholecystectomy. EXAM: UPPER GI SERIES WITH KUB TECHNIQUE: After obtaining a scout radiograph a routine upper GI series was performed using thin and high density barium. FLUOROSCOPY TIME:  Fluoroscopy Time:  2 minutes 6 seconds Radiation Exposure Index (if provided by the fluoroscopic device): 413 mGy Number of Acquired Spot Images: 14 COMPARISON:  04/10/2019 CT abdomen/pelvis. FINDINGS: Scout radiograph demonstrates no dilated small bowel loops. Mild colonic stool. No evidence of pneumatosis or pneumoperitoneum. No radiopaque nephrolithiasis. Clear lung bases. Cholecystectomy clips are seen in the right upper quadrant of the abdomen. Small hiatal hernia. Mild gastroesophageal reflux elicited to the level of the midthoracic esophagus with water siphon test. Minimal esophageal dysmotility, characterized by intermittent minimal weakening of primary peristalsis in the lower thoracic esophagus. Normal esophageal mucosa, with no evidence of reflux esophagitis. No evidence of esophageal mass, stricture or ulcer. Barium tablet traversed the esophagus into the stomach without delay. Normal gastric peristalsis and emptying. No evidence of gastric fold thickening, masses, ulcers or significant erosions. Normal duodenal bulb and C sweep, with no evidence of duodenal fold thickening, strictures, filling defects or ulcers. Normal duodenal jejunal junction to the left  of the spine. Visualized proximal jejunal loops are normal caliber and without fold thickening. IMPRESSION: 1. Small hiatal hernia.  Mild gastroesophageal reflux elicited. 2. Minimal esophageal dysmotility, with a reflux related dysmotility pattern. 3. Otherwise normal upper GI, with no evidence of strictures, masses or ulcer disease. Electronically Signed   By: Delbert Phenix M.D.   On: 07/04/2019 10:10      Edman Circle, MD 08/02/2019, 8:59 AM  Cc: Harriette Bouillon, MD

## 2019-08-02 NOTE — Patient Instructions (Signed)
If you are age 51 or older, your body mass index should be between 23-30. Your Body mass index is 42.21 kg/m. If this is out of the aforementioned range listed, please consider follow up with your Primary Care Provider.  If you are age 70 or younger, your body mass index should be between 19-25. Your Body mass index is 42.21 kg/m. If this is out of the aformentioned range listed, please consider follow up with your Primary Care Provider.   We have sent the following medications to your pharmacy for you to pick up at your convenience: Omeprazole  Please purchase the following medications over the counter and take as directed: Colace  Please use icy hot or biofreeze in am and using heating pad at bedtime.   Follow up in 12 weeks.   Thank you,  Dr. Jackquline Denmark

## 2019-08-07 ENCOUNTER — Ambulatory Visit: Payer: BC Managed Care – PPO | Admitting: Gastroenterology

## 2019-08-17 ENCOUNTER — Telehealth: Payer: Self-pay | Admitting: Gastroenterology

## 2019-08-17 DIAGNOSIS — K760 Fatty (change of) liver, not elsewhere classified: Secondary | ICD-10-CM

## 2019-08-17 DIAGNOSIS — R1011 Right upper quadrant pain: Secondary | ICD-10-CM

## 2019-08-17 NOTE — Telephone Encounter (Signed)
Pt would like a call to update on her status and has questions regarding fatty liver or NASH.

## 2019-08-18 NOTE — Telephone Encounter (Signed)
Called and spoke with patient- patient reports the acid reflux is improved with the Prilosec; patient reports terrible pain, LUQ/side/datigue/nausea after eating-(worse at night); RN answered all of the questions possible with the patient and the patient is requesting to have another appt with Dr. Nyra Market wanting to know if the episode in June is contributing to the pain in the abdomen and it has "always been the liver that has been given me this pain- Patient is requesting to know the percentage of fatty liver she has and what is prognosis for this diagnosis; "what stage am I at" -Wanting to know if Verapamil could be the cause of this liver damage-wanting to know if it can be decreased or alternative medication? Needs options -vitamin D 5000 per day -still remaining low-recommendation on dosage? -no longer taking Tylenol-are there any OTC pain medications that are advised for her? -vitamin E-does she need to start taking this for liver function? She is not diabetic but was advised this could increase her BS levels-does not want to throw her into diabetes?? Liver elastisity tests-does she need this?-does she need more liver specific tests? Patient reports she has been on [employer] but she wants "to know the information so she can get healthier so hopefully she can reverse some of this damage" Please advise on specific answers as patient has been scheduled for 08/31/2019 at 11:20 am and will have additional questions at that time;  Pamphlets for fatty liver and low fat diet have been mailed to the patient;

## 2019-08-18 NOTE — Telephone Encounter (Signed)
All good questions -The only treatment for fatty liver at this time is weight loss/weight loss/weight loss.  If one loses 7% of the current body weight, that decreases fibrosis by 50% in the liver. -Verapamil does not cause any liver problems -Can still use Tylenol but not more than 4 in a day.  Definitely without alcohol -Exercise would help. -If she is interested, we can set her up for USE (hepatic elastography) -that will quantify the liver damage. -Can continue taking vitamin E 400 units once a day -Follow-up as per last clinic note  Thx  RG

## 2019-08-21 NOTE — Telephone Encounter (Signed)
Left message for patient to call back to the office-instruct the patient has been ordered to have a hepatic elastography to be completed at [facility] will call her to set this up

## 2019-08-21 NOTE — Telephone Encounter (Signed)
Patient returned call to the office-patient informed of previous message concerning the [name] elastography;  Patient advised to call back to the office at [phone] should questions/concerns arise;  Patient verbalized understanding of information/instructions;

## 2019-08-21 NOTE — Telephone Encounter (Signed)
Called and spoke with patient-patient informed of result note and MD recommendations; patient is agreeable with plan of care and verified PCP; result note routed to PCP per MD recommendations; Patient verbalized understanding of information/instructions;  Patient was advised to call the office at [phone] if questions/concerns arise;  Dr. Lyndel Safe -the patient is requesting to proceed with the USE-can an order be entered and the patient scheduled for this? Please advise

## 2019-08-21 NOTE — Telephone Encounter (Signed)
Proceed with ultrasound elastography. Thx  RG

## 2019-08-21 NOTE — Telephone Encounter (Signed)
Order has been placed in Epic for [name] of liver with elastography (hepatic elastography)-to be completed at [facility] faxed to [place] for them to schedule the patient for this scan

## 2019-08-30 DIAGNOSIS — K760 Fatty (change of) liver, not elsewhere classified: Secondary | ICD-10-CM | POA: Insufficient documentation

## 2019-08-31 ENCOUNTER — Other Ambulatory Visit: Payer: Self-pay

## 2019-08-31 ENCOUNTER — Ambulatory Visit
Admission: RE | Admit: 2019-08-31 | Discharge: 2019-08-31 | Disposition: A | Discharge status: Home / Self Care | Payer: BC Managed Care – PPO | Source: Ambulatory Visit | Attending: Gastroenterology | Admitting: Gastroenterology

## 2019-08-31 ENCOUNTER — Ambulatory Visit: Payer: BC Managed Care – PPO | Admitting: Gastroenterology

## 2019-08-31 ENCOUNTER — Encounter: Payer: Self-pay | Admitting: Gastroenterology

## 2019-08-31 VITALS — BP 130/86 | HR 87 | Temp 98.5°F | Ht 66.0 in | Wt 252.0 lb

## 2019-08-31 DIAGNOSIS — K760 Fatty (change of) liver, not elsewhere classified: Secondary | ICD-10-CM

## 2019-08-31 DIAGNOSIS — R1011 Right upper quadrant pain: Secondary | ICD-10-CM

## 2019-08-31 NOTE — Patient Instructions (Signed)
If you are age 51 or older, your body mass index should be between 23-30. Your Body mass index is 40.67 kg/m. If this is out of the aforementioned range listed, please consider follow up with your Primary Care Provider.  If you are age 31 or younger, your body mass index should be between 19-25. Your Body mass index is 40.67 kg/m. If this is out of the aformentioned range listed, please consider follow up with your Primary Care Provider.   Please go to the lab at [facility] ([place] You will need to go to level "B", you do not need an appointment for this. Hours available are 7:30 am - 4:30 pm.   Your doctor has ordered a Liver Biopsy, you will be contacted with this appointment. If you have not received a call you can call [phone].   Follow up in 12 weeks.   Thank you,  Dr. Jackquline Denmark

## 2019-08-31 NOTE — Progress Notes (Signed)
Chief Complaint:   Referring Provider:  Katherina Mires, MD      ASSESSMENT AND PLAN;   #1. Fatty liver with Nl LFTs. USE showing sig fatty liver with kPA of 45.3 s/o advanced liver disease. Nl doppler.  No clinical or biochemical portal hypertension. Ast:plt (APRI) ratio of 0.3. Alb 4.2 (03/2019). No ETOH. Has assoc Obesity, HLD. No DM.   #2.RUQ pain. ? Etiology. Likely musculoskeltal. R/O other causes. Neg UGI series except small HH 07/04/2019, neg CT and then MRI/MRCP 06/17/2019 except for small hepatic cysts and fatty liver.  #3. GERD with small HH  #4.  Wants to hold off on colorectal cancer screening at this time.   Plan:  -Check CBC, CMP, PT INR, acute viral hepatitis, autoimmune hepatitis panel (AMA, ASMA and anti-LKM), SPEP, iron studies, HFE gene analysis, serum ceruloplasmin, A1AT, celiac screen and GGT. -Check anti-HAV total Ab and HBsAb.  If negative, would recommend vaccination for hepatitis A and B. -Walking 1hr every day.  Can gradually increase further.  Monitor weight.  She has joined [employer] through [employer] for weight loss. Not interested in bariatric surgery at this time. -Proceed with ultrasound-guided liver biopsy.  I discussed risks and benefits including small but definite risks of bleeding, infection and pulmonary complications.  The benefits were also discussed. -More Mediterranean diet.  Stop all fried foods.  Avoid high calorie foods, sodas. -Continue omeprazole 20mg  po qd, 30, 6 refills -Add Vit E 400 IU po qd. -Can have up to 2 cups of coffee -I have recommended EGD/screening colonoscopy.  However, she would like to wait for now as she " had too many tests".  -FU in 12 weeks.  At FU, would again talk to her about endoscopic procedures especially screening colonoscopy (age>50).    HPI:    Ruth Matthews is a 53 y.o. female   For FU USE done this morning. Showing significant fatty liver disease with with kPA of 45.3 s/o advanced liver  disease.  Has joined [employer] thru [facility].  And has been able to lose 9 pounds since the last visit.  She has been exercising every day.  Wt Readings from Last 3 Encounters:  08/31/19 252 lb (114.3 kg)  08/02/19 261 lb 8 oz (118.6 kg)  04/13/19 242 lb (109.8 kg)   Is under considerable stress due to mother-in-law in hospice - nor doing well.   Still with occasional RUQ pain x 2 yrs- achy, sharp at times, s/p cholecystectomy 1994, worse after larger, fatty meals, bumpy rides, better with tylenol. Associated indigestion and heartburn.  Occasional nausea but no vomiting.  Heartburn and indigestion is better with omeprazole.  Occ constipation with passage of pellet-like stools, lot of gas at times.  Better by increasing water intake.  No melena or hematochezia.  No weight loss.  She had extensive work-up including normal CBC, CMP, lipase.  Neg UGI series except small HH 07/04/2019, neg CT and then MRI/MRCP 06/17/2019 except for small hepatic cysts and fatty liver.  Had been on omeprazole in the past which did help previously.  No nonsteroidals.  No history of itching, skin lesions, easy bruisability, intake of over-the-counter medications including diet pills, herbal medications, anabolic steroids or Tylenol.  There is no history of blood transfusions, IV drug use or family history of liver disease.  No jaundice dark urine or pale stools.    No history of alcohol use/abuse.  Past Medical History:  Diagnosis Date  . Anxiety   . DVT (  deep venous thrombosis) (HCC)   . Elevated fasting blood sugar   . Gallstone   . Gestational diabetes 2000  . Hypertension   . 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 Anne Fu, Dr. Graciela Husbands  . Obesity   . Ovarian cyst    ruptured  . [facility] spotted fever) 2004   history of   . Varicose veins   . Ventricular tachycardia (HCC)   . Vitamin D deficiency     Past Surgical History:  Procedure Laterality Date  . CARDIAC  CATHETERIZATION     no CAD  . CESAREAN SECTION    . CHOLECYSTECTOMY    . ENDOVENOUS ABLATION SAPHENOUS VEIN W/ LASER Bilateral 02/2011   Both legs a few weeks apart    Family History  Problem Relation Age of Onset  . Hypertension Mother   . COPD Mother   . Other Father        died from accident, h/o lyme disease  . Colon cancer Neg Hx   . Esophageal cancer Neg Hx     Social History   Tobacco Use  . Smoking status: Never Smoker  . Smokeless tobacco: Never Used  Substance Use Topics  . Alcohol use: Yes    Alcohol/week: 0.0 standard drinks    Comment: occasionall  . Drug use: No    Current Outpatient Medications  Medication Sig Dispense Refill  . acetaminophen (TYLENOL) 325 MG tablet Take 650 mg by mouth every 6 (six) hours as needed.    Marland Kitchen aspirin EC 81 MG tablet Take 1 tablet (81 mg total) by mouth daily. 90 tablet 3  . Cholecalciferol (VITAMIN D) 50 MCG (2000 UT) CAPS Take 4,000 Int'l Units by mouth daily.    . fexofenadine (ALLEGRA) 180 MG tablet Take 1 tablet by mouth daily.    . fluticasone (FLONASE) 50 MCG/ACT nasal spray Place 1 spray into both nostrils daily.    Marland Kitchen omeprazole (PRILOSEC) 20 MG capsule Take 1 capsule (20 mg total) by mouth daily. 30 capsule 6  . verapamil (CALAN-SR) 240 MG CR tablet TAKE 1 TABLET DAILY 30 tablet 0   No current facility-administered medications for this visit.     Allergies  Allergen Reactions  . Latex Itching, Swelling and Rash  . Azithromycin     Upset stomach   . Levaquin [Levofloxacin]     Review of Systems:  Has situational anxiety.     Physical Exam:    Temp 98.5 F (36.9 C)   Ht  (1.676 m)   Wt 252 lb (114.3 kg)   BMI 40.67 kg/m  Filed Weights   08/31/19 1113  Weight: 252 lb (114.3 kg)   Constitutional:  Well-developed, in no acute distress. Psychiatric: Normal mood and affect. Behavior is normal. HEENT: Pupils normal.  Conjunctivae are normal. No scleral icterus. Neck supple.  Cardiovascular:  Normal rate, regular rhythm. No edema Pulmonary/chest: Effort normal and breath sounds normal. No wheezing, rales or rhonchi. Abdominal: Soft, nondistended.  Right upper quadrant tenderness mostly musculoskeletal, increased by raising legs on contraction of rectus abdominis muscle.  Bowel sounds active throughout. There are no masses palpable. No hepatomegaly. Rectal:  defered Neurological: Alert and oriented to person place and time. Skin: Skin is warm and dry. No rashes noted.  Data Reviewed: I have personally reviewed following labs and imaging studies  CBC: CBC Latest Ref Rng & Units 04/13/2019 09/30/2018 03/02/2014  WBC 4.0 - 10.5 K/uL 6.5 5.0 7.0  Hemoglobin 12.0 - 15.0  g/dL 16.113.1 09.613.0 04.512.4  Hematocrit 36.0 - 46.0 % 41.6 39.9 37.8  Platelets 150 - 400 K/uL 151 199 242.0    CMP: CMP Latest Ref Rng & Units 04/13/2019 04/10/2019 09/30/2018  Glucose 70 - 99 mg/dL 409(W108(H) 119(J106(H) 478(G104(H)  BUN 6 - 20 mg/dL 18 13 12   Creatinine 0.44 - 1.00 mg/dL 9.560.74 2.130.80 0.860.72  Sodium 135 - 145 mmol/L 138 138 142  Potassium 3.5 - 5.1 mmol/L 3.9 4.0 4.5  Chloride 98 - 111 mmol/L 103 108 103  CO2 22 - 32 mmol/L 24 21(L) 23  Calcium 8.9 - 10.3 mg/dL 9.0 5.7(Q8.8(L) 9.5  Total Protein 6.5 - 8.1 g/dL 7.2 7.4 7.1  Total Bilirubin 0.3 - 1.2 mg/dL 0.4 0.6 0.4  Alkaline Phos 38 - 126 U/L 47 43 56  AST 15 - 41 U/L 21 14(L) 12  ALT 0 - 44 U/L 28 22 15     GFR  Radiology Studies: [place] Elastography Liver  Result Date: 08/31/2019 CLINICAL DATA:  53 year old female with right upper quadrant abdominal pain and fatty liver. EXAM: US LIVER ELASTOGRAPHY TECHNIQUE: Sonography of the liver was performed. In addition, ultrasound elastography evaluation of the liver was performed. A region of interest was placed within the right lobe of the liver. Following application of a compressive sonographic pulse, tissue compressibility was assessed. Multiple assessments were performed at the selected site. Median tissue compressibility was  determined. Previously, hepatic stiffness was assessed by shear wave velocity. Based on recently published Society of Radiologists in Ultrasound consensus article, reporting is now recommended to be performed in the SI units of pressure (kiloPascals) representing hepatic stiffness/elasticity. The obtained result is compared to the published reference standards. (cACLD= compensated Advanced Chronic Liver Disease) COMPARISON:  Abdominal MRI dated 06/17/2019 and CT of the abdomen pelvis dated 04/17/2019. FINDINGS: Liver: The liver demonstrates a heterogeneous echotexture with increased echogenicity. Portal vein is patent on color Doppler imaging with normal direction of blood flow towards the liver. ULTRASOUND HEPATIC ELASTOGRAPHY Device: Siemens Helix VTQ Patient position: Oblique Transducer: 6C1 Number of measurements: 10 Hepatic segment:  8 Median kPa: 45.3 IQR: 19.6 IQR/Median kPa ratio: 0.4 Data quality:  Reduced accuracy Diagnostic category:  >13 kPa: highly suggestive of cACLD IMPRESSION: ULTRASOUND LIVER: Heterogeneous with increased echogenicity. ULTRASOUND HEPATIC ELASTOGRAPHY: Median kPa:  45.3 Diagnostic category:  >13 kPa: highly suggestive of cACLD The use of hepatic elastography is applicable to patients with viral hepatitis and non-alcoholic fatty liver disease. At this time, there is insufficient data for the referenced cut-off values and use in other causes of liver disease, including alcoholic liver disease. Patients, however, may be assessed by elastography and serve as their own reference standard/baseline. In patients with non-alcoholic liver disease, the values suggesting compensated advanced chronic liver disease (cACLD) may be lower, and patients may need additional testing with elasticity results of 7-9 kPa. Please note that abnormal hepatic elasticity and shear wave velocities may also be identified in clinical settings other than with hepatic fibrosis, such as: acute hepatitis, elevated  right heart and central venous pressures including use of beta blockers, veno-occlusive disease (Budd-Chiari), infiltrative processes such as mastocytosis/amyloidosis/infiltrative tumor/lymphoma, extrahepatic cholestasis, with hyperemia in the post-prandial state, and with liver transplantation. Correlation with patient history, laboratory data, and clinical condition recommended. Electronically Signed   By: Elgie CollardArash  Radparvar M.D.   On: 08/31/2019 10:28  25 minutes spent with the patient today. Greater than 50% was spent in counseling and coordination of care with the patient     Edman CircleRaj Nariya Neumeyer,  MD 08/31/2019, 11:14 AM  Cc: Macy Mis, MD

## 2019-09-04 ENCOUNTER — Other Ambulatory Visit (INDEPENDENT_AMBULATORY_CARE_PROVIDER_SITE_OTHER): Payer: BC Managed Care – PPO

## 2019-09-04 ENCOUNTER — Telehealth: Payer: Self-pay | Admitting: Gastroenterology

## 2019-09-04 DIAGNOSIS — R1011 Right upper quadrant pain: Secondary | ICD-10-CM | POA: Diagnosis not present

## 2019-09-04 DIAGNOSIS — K760 Fatty (change of) liver, not elsewhere classified: Secondary | ICD-10-CM | POA: Diagnosis not present

## 2019-09-04 LAB — CBC WITH DIFFERENTIAL/PLATELET
Basophils Absolute: 0 10*3/uL (ref 0.0–0.1)
Basophils Relative: 0.8 % (ref 0.0–3.0)
Eosinophils Absolute: 0.2 10*3/uL (ref 0.0–0.7)
Eosinophils Relative: 3.8 % (ref 0.0–5.0)
HCT: 41.1 % (ref 36.0–46.0)
Hemoglobin: 13.4 g/dL (ref 12.0–15.0)
Lymphocytes Relative: 25.8 % (ref 12.0–46.0)
Lymphs Abs: 1.3 10*3/uL (ref 0.7–4.0)
MCHC: 32.6 g/dL (ref 30.0–36.0)
MCV: 81.7 fl (ref 78.0–100.0)
Monocytes Absolute: 0.5 10*3/uL (ref 0.1–1.0)
Monocytes Relative: 11.2 % (ref 3.0–12.0)
Neutro Abs: 2.8 10*3/uL (ref 1.4–7.7)
Neutrophils Relative %: 58.4 % (ref 43.0–77.0)
Platelets: 188 10*3/uL (ref 150.0–400.0)
RBC: 5.03 Mil/uL (ref 3.87–5.11)
RDW: 16.1 % — ABNORMAL HIGH (ref 11.5–15.5)
WBC: 4.9 10*3/uL (ref 4.0–10.5)

## 2019-09-04 LAB — COMPREHENSIVE METABOLIC PANEL
ALT: 22 U/L (ref 0–35)
AST: 14 U/L (ref 0–37)
Albumin: 4.3 g/dL (ref 3.5–5.2)
Alkaline Phosphatase: 66 U/L (ref 39–117)
BUN: 11 mg/dL (ref 6–23)
CO2: 24 mEq/L (ref 19–32)
Calcium: 9.5 mg/dL (ref 8.4–10.5)
Chloride: 104 mEq/L (ref 96–112)
Creatinine, Ser: 0.75 mg/dL (ref 0.40–1.20)
GFR: 81.23 mL/min (ref >60.00)
Glucose, Bld: 111 mg/dL — ABNORMAL HIGH (ref 70–99)
Potassium: 4 mEq/L (ref 3.5–5.1)
Sodium: 138 mEq/L (ref 135–145)
Total Bilirubin: 0.4 mg/dL (ref 0.2–1.2)
Total Protein: 7.9 g/dL (ref 6.0–8.3)

## 2019-09-04 LAB — PROTIME-INR
INR: 1 ratio (ref 0.8–1.0)
Prothrombin Time: 11.5 s (ref 9.6–13.1)

## 2019-09-04 LAB — GAMMA GT: GGT: 11 U/L (ref 7–51)

## 2019-09-04 NOTE — Telephone Encounter (Signed)
Patient is calling with the following questions: -With the liver biopsy what information would be gained from doing it that is not on the MRI or CT? And how might this change her treatment? -Patient states she did schedule her biopsy but is there anyway to have a local anesthetic? -When doing the biopsy does [place] do a COVID-19 test? And her doctor is recommending her take sertraline 50 mg. And is asking if this is okay? Will it affect her liver?

## 2019-09-05 NOTE — Telephone Encounter (Signed)
MyChart message sent to the patient with response from Dr. Lyndel Safe on her questions; Patient advised to call back to the office at [phone] should questions/concerns arise;

## 2019-09-05 NOTE — Telephone Encounter (Signed)
-  With the liver biopsy what information would be gained from doing it that is not on the MRI or CT?   Liver biopsy is a more accurate test to know exactly what is happening.  As discussed in the clinic, her labs and USE do not correlate very well.    And how might this change her treatment?  Treatment will depend upon the cause of the problems.  -Patient states she did schedule her biopsy but is there anyway to have a local anesthetic?   She will be given a local anesthetic.   -When doing the biopsy does [place] do a COVID-19 test?   Covid testing is usually done 3 days before as per protocol.    -And her doctor is recommending her take sertraline 50 mg. And is asking if this is okay? Will it affect her liver?  Should not.  I think it would help.  No data or recommendations.  Still, can stop 3 days before biopsy, and then can restart.

## 2019-09-06 ENCOUNTER — Telehealth: Payer: Self-pay | Admitting: Gastroenterology

## 2019-09-06 LAB — IRON, TOTAL/TOTAL IRON BINDING CAP
%SAT: 11 % (calc) — ABNORMAL LOW (ref 16–45)
Iron: 40 ug/dL — ABNORMAL LOW (ref 45–160)
TIBC: 354 mcg/dL (calc) (ref 250–450)

## 2019-09-06 LAB — CELIAC PANEL 10
Antigliadin Abs, IgA: 6 units (ref 0–19)
Endomysial IgA: NEGATIVE
Gliadin IgG: 2 units (ref 0–19)
IgA/Immunoglobulin A, Serum: 117 mg/dL (ref 87–352)
Tissue Transglut Ab: 3 U/mL (ref 0–5)
Transglutaminase IgA: <2 U/mL (ref 0–3)

## 2019-09-06 LAB — PROTEIN ELECTROPHORESIS, SERUM
Albumin ELP: 4 g/dL (ref 3.8–4.8)
Alpha 1: 0.3 g/dL (ref 0.2–0.3)
Alpha 2: 0.7 g/dL (ref 0.5–0.9)
Beta 2: 0.4 g/dL (ref 0.2–0.5)
Beta Globulin: 0.4 g/dL (ref 0.4–0.6)
Gamma Globulin: 1.5 g/dL (ref 0.8–1.7)
Total Protein: 7.4 g/dL (ref 6.1–8.1)

## 2019-09-06 LAB — ALPHA-1-ANTITRYPSIN: A-1 Antitrypsin, Ser: 146 mg/dL (ref 83–199)

## 2019-09-06 LAB — MITOCHONDRIAL ANTIBODIES: Mitochondrial M2 Ab, IgG: <=20 U

## 2019-09-06 LAB — HEPATITIS PANEL, ACUTE
Hep A IgM: NONREACTIVE
Hep B C IgM: NONREACTIVE
Hepatitis B Surface Ag: NONREACTIVE
Hepatitis C Ab: NONREACTIVE
SIGNAL TO CUT-OFF: 0.04 (ref <1.00)

## 2019-09-06 LAB — HEPATITIS A ANTIBODY, TOTAL: Hepatitis A AB,Total: NONREACTIVE

## 2019-09-06 LAB — HEPATITIS B SURFACE ANTIBODY,QUALITATIVE: Hep B S Ab: NONREACTIVE

## 2019-09-06 LAB — CERULOPLASMIN: Ceruloplasmin: 36 mg/dL (ref 18–53)

## 2019-09-06 LAB — ANTI-SMOOTH MUSCLE ANTIBODY, IGG: Actin (Smooth Muscle) Antibody (IGG): <20 U (ref <20)

## 2019-09-06 NOTE — Telephone Encounter (Signed)
Please review previous message and advise 

## 2019-09-06 NOTE — Telephone Encounter (Signed)
I do anticipate EGD/colonoscopy when she feels little better.  RG

## 2019-09-19 ENCOUNTER — Other Ambulatory Visit: Payer: Self-pay | Admitting: Physician Assistant

## 2019-09-20 ENCOUNTER — Ambulatory Visit (HOSPITAL_COMMUNITY)
Admission: RE | Admit: 2019-09-20 | Discharge: 2019-09-20 | Disposition: A | Discharge status: Home / Self Care | Payer: BC Managed Care – PPO | Source: Ambulatory Visit | Attending: Gastroenterology | Admitting: Gastroenterology

## 2019-09-20 ENCOUNTER — Other Ambulatory Visit: Payer: Self-pay

## 2019-09-20 DIAGNOSIS — Z9049 Acquired absence of other specified parts of digestive tract: Secondary | ICD-10-CM | POA: Diagnosis not present

## 2019-09-20 DIAGNOSIS — R1011 Right upper quadrant pain: Secondary | ICD-10-CM

## 2019-09-20 DIAGNOSIS — I10 Essential (primary) hypertension: Secondary | ICD-10-CM | POA: Insufficient documentation

## 2019-09-20 DIAGNOSIS — Z825 Family history of asthma and other chronic lower respiratory diseases: Secondary | ICD-10-CM | POA: Diagnosis not present

## 2019-09-20 DIAGNOSIS — Z86718 Personal history of other venous thrombosis and embolism: Secondary | ICD-10-CM | POA: Insufficient documentation

## 2019-09-20 DIAGNOSIS — K760 Fatty (change of) liver, not elsewhere classified: Secondary | ICD-10-CM | POA: Insufficient documentation

## 2019-09-20 DIAGNOSIS — F419 Anxiety disorder, unspecified: Secondary | ICD-10-CM | POA: Insufficient documentation

## 2019-09-20 DIAGNOSIS — E559 Vitamin D deficiency, unspecified: Secondary | ICD-10-CM | POA: Insufficient documentation

## 2019-09-20 DIAGNOSIS — Z7982 Long term (current) use of aspirin: Secondary | ICD-10-CM | POA: Diagnosis not present

## 2019-09-20 DIAGNOSIS — Z6841 Body Mass Index (BMI) 40.0 and over, adult: Secondary | ICD-10-CM | POA: Diagnosis not present

## 2019-09-20 DIAGNOSIS — Z79899 Other long term (current) drug therapy: Secondary | ICD-10-CM | POA: Insufficient documentation

## 2019-09-20 DIAGNOSIS — K219 Gastro-esophageal reflux disease without esophagitis: Secondary | ICD-10-CM | POA: Diagnosis not present

## 2019-09-20 DIAGNOSIS — E669 Obesity, unspecified: Secondary | ICD-10-CM | POA: Insufficient documentation

## 2019-09-20 DIAGNOSIS — Z8249 Family history of ischemic heart disease and other diseases of the circulatory system: Secondary | ICD-10-CM | POA: Diagnosis not present

## 2019-09-20 DIAGNOSIS — Z7951 Long term (current) use of inhaled steroids: Secondary | ICD-10-CM | POA: Diagnosis not present

## 2019-09-20 LAB — PREGNANCY, URINE: Preg Test, Ur: NEGATIVE

## 2019-09-20 LAB — CBC
HCT: 44.2 % (ref 36.0–46.0)
Hemoglobin: 14 g/dL (ref 12.0–15.0)
MCH: 26.5 pg (ref 26.0–34.0)
MCHC: 31.7 g/dL (ref 30.0–36.0)
MCV: 83.7 fL (ref 80.0–100.0)
Platelets: 196 10*3/uL (ref 150–400)
RBC: 5.28 MIL/uL — ABNORMAL HIGH (ref 3.87–5.11)
RDW: 15.4 % (ref 11.5–15.5)
WBC: 6.9 10*3/uL (ref 4.0–10.5)
nRBC: 0 % (ref 0.0–0.2)

## 2019-09-20 LAB — PROTIME-INR
INR: 0.9 (ref 0.8–1.2)
Prothrombin Time: 11.9 seconds (ref 11.4–15.2)

## 2019-09-20 MED ORDER — FLUMAZENIL 1 MG/10ML IV SOLN
INTRAVENOUS | Status: AC
  Filled 2019-09-20: qty 10

## 2019-09-20 MED ORDER — ACETAMINOPHEN 325 MG PO TABS
650.0000 mg | ORAL_TABLET | Freq: Once | ORAL | Status: AC
  Administered 2019-09-20: 650 mg via ORAL
  Filled 2019-09-20: qty 2

## 2019-09-20 MED ORDER — MIDAZOLAM HCL 2 MG/2ML IJ SOLN
INTRAMUSCULAR | Status: AC
  Filled 2019-09-20: qty 2

## 2019-09-20 MED ORDER — SODIUM CHLORIDE 0.9 % IV SOLN: INTRAVENOUS | Status: DC

## 2019-09-20 MED ORDER — ACETAMINOPHEN 325 MG PO TABS
ORAL_TABLET | ORAL | Status: AC
  Filled 2019-09-20: qty 2

## 2019-09-20 MED ORDER — LIDOCAINE HCL (PF) 1 % IJ SOLN
INTRAMUSCULAR | Status: AC
  Filled 2019-09-20: qty 30

## 2019-09-20 MED ORDER — NALOXONE HCL 0.4 MG/ML IJ SOLN
INTRAMUSCULAR | Status: AC
  Filled 2019-09-20: qty 1

## 2019-09-20 MED ORDER — GELATIN ABSORBABLE 12-7 MM EX MISC
CUTANEOUS | Status: AC
  Filled 2019-09-20: qty 1

## 2019-09-20 MED ORDER — FENTANYL CITRATE (PF) 100 MCG/2ML IJ SOLN
INTRAMUSCULAR | Status: AC
  Filled 2019-09-20: qty 2

## 2019-09-20 NOTE — Procedures (Signed)
Interventional Radiology Procedure Note  Procedure: US Guided Biopsy of Liver  Complications: None  Estimated Blood Loss: < 10 mL  Findings: 18 G core biopsy of liver performed under US guidance.  Two core samples obtained and sent to Pathology.  Imaya Duffy T. Abdul Beirne, M.D Pager:  [phone]   

## 2019-09-20 NOTE — Discharge Instructions (Signed)
Liver Biopsy, Care After °These instructions give you information about how to care for yourself after your procedure. Your health care provider may also give you more specific instructions. If you have problems or questions, contact your health care provider. °What can I expect after the procedure? °After your procedure, it is common to have: °· Pain and soreness in the area where the biopsy was done. °· Bruising around the area where the biopsy was done. °· Sleepiness and fatigue for 1-2 days. °Follow these instructions at home: °Medicines °· Take over-the-counter and prescription medicines only as told by your health care provider. °· If you were prescribed an antibiotic medicine, take it as told by your health care provider. Do not stop taking the antibiotic even if you start to feel better. °· Do not take medicines such as aspirin and ibuprofen unless your health care provider tells you to take them. These medicines thin your blood and can increase the risk of bleeding. °· If you are taking prescription pain medicine, take actions to prevent or treat constipation. Your health care provider may recommend that you: °? Drink enough fluid to keep your urine pale yellow. °? Eat foods that are high in fiber, such as fresh fruits and vegetables, whole grains, and beans. °? Limit foods that are high in fat and processed sugars, such as fried or sweet foods. °? Take an over-the-counter or prescription medicine for constipation. °Incision care °· Follow instructions from your health care provider about how to take care of your incision. Make sure you: °? Wash your hands with soap and water before you change your bandage (dressing). If soap and water are not available, use hand sanitizer. °? Change your dressing as told by your health care provider. °? Leave stitches (sutures), skin glue, or adhesive strips in place. These skin closures may need to stay in place for 2 weeks or longer. If adhesive strip edges start to  loosen and curl up, you may trim the loose edges. Do not remove adhesive strips completely unless your health care provider tells you to do that. °· Check your incision area every day for signs of infection. Check for: °? Redness, swelling, or pain. °? Fluid or blood. °? Warmth. °? Pus or a bad smell. °· Do not take baths, swim, or use a hot tub until your health care provider says it is okay to do so. °Activity ° °· Rest at home for 1-2 days, or as directed by your health care provider. °? Avoid sitting for a long time without moving. Get up to take short walks every 1-2 hours. This is important to improve blood flow and breathing. Ask for help if you feel weak or unsteady. °· Return to your normal activities as told by your health care provider. Ask your health care provider what activities are safe for you. °· Do not drive or use heavy machinery while taking prescription pain medicine. °· Do not lift anything that is heavier than 10 lb (4.5 kg), or the limit that your health care provider tells you, until he or she says that it is safe. °· Do not play contact sports for 2 weeks after the procedure. °General instructions ° °· Do not drink alcohol in the first week after the procedure. °· Have someone stay with you for at least 24 hours after the procedure. °· It is your responsibility to obtain your test results. Ask your health care provider, or the department that is doing the test: °? When will my   results be ready? °? How will I get my results? °? What are my treatment options? °? What other tests do I need? °? What are my next steps? °· Keep all follow-up visits as told by your health care provider. This is important. °Contact a health care provider if: °· You have increased bleeding from an incision, resulting in more than a small spot of blood. °· You have redness, swelling, or increasing pain in any incisions. °· You notice a discharge or a bad smell coming from any of your incisions. °· You have a fever or  chills. °Get help right away if: °· You develop swelling, bloating, or pain in your abdomen. °· You become dizzy or faint. °· You develop a rash. °· You have nausea or you vomit. °· You faint, or you have shortness of breath or difficulty breathing. °· You develop chest pain. °· You have problems with your speech or vision. °· You have trouble with your balance or moving your arms or legs. °Summary °· After the liver biopsy, it is common to have pain, soreness, and bruising in the area, as well as sleepiness and fatigue. °· Take over-the-counter and prescription medicines only as told by your health care provider. °· Follow instructions from your health care provider about how to care for your incision. Check the incision area daily for signs of infection. °This information is not intended to replace advice given to you by your health care provider. Make sure you discuss any questions you have with your health care provider. °Document Released: 04/24/2005 Document Revised: 11/28/2018 Document Reviewed: 10/15/2017 °[employer]vier Patient Education © 2020 [employer] ° °

## 2019-09-20 NOTE — H&P (Signed)
Chief Complaint: Patient was seen in consultation today for a random liver biopsy.  Referring Physician(s): Gupta,Rajesh  Supervising Physician: Irish Lack  Patient Status: [facility] - Out-pt  History of Present Illness: Ruth Matthews is a 53 y.o. female with a past medical history significant for anxiety, RMSF, ventricular tachycardia, HTN, DVT, GERD, prior cholecystectomy (1994), obesity and fatty liver followed by Dr. Chales Abrahams who presents today for a random liver biopsy. Ruth Matthews has been followed by Dr. Chales Abrahams since October of this year due to ongoing RUQ pain which has been present for approximately 2 years and is associated with heartburn, indigestion and nausea without vomiting. She reported that the pain was worsened with large/fatty meals or bumpy rides, and improved with Tylenol. She has had an extensive workup over the years, including UGI and MRCP, which has been notable for small hepatic cysts and fatty liver. She has been encouraged to continue weight loss as well as proceed with EGD/colonoscopy and random liver biopsy for which she presents today.   Ruth Matthews reports that her RUQ pain is unchanged since her previous visits with Dr. Chales Abrahams and is associated with the same symptoms as noted above. She takes Tylenol approximately once per week at night when she is having pain that is disrupting her sleep. She would like to try the biopsy with local anesthetic only as she is concerned about sedating medications - she feels that she will be ok without the moderate anesthesia because she previously had varicose vein surgery without anesthesia and did well, however she states she will think about it a little more after our discussion. She does have a driver and someone at home with her. She also reports LLE swelling which as been happening intermittently for approximately 1 year and seems to resolve on it's own, however she recently noted that the swelling extends at times up to her hip area.  She does not currently have any swelling that she is aware of. She has not presented this issue to her PCP or other specialists for further evaluation. She does have a history of RLE DVT following varicose vein surgery, she is on ASA 81 mg QD only. She states understanding of requested procedure and wishes to proceed.   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 Anne Fu, Dr. Graciela Husbands   Obesity    Ovarian cyst    ruptured   RMSF [facility] spotted fever) 2004   history of    Varicose veins    Ventricular tachycardia (HCC)    Vitamin D deficiency     Past Surgical History:  Procedure Laterality Date   CARDIAC CATHETERIZATION     no CAD   CESAREAN SECTION     CHOLECYSTECTOMY     ENDOVENOUS ABLATION SAPHENOUS VEIN W/ LASER Bilateral 02/2011   Both legs a few weeks apart    Allergies: Latex, Azithromycin, and Levaquin [levofloxacin]  Medications: Prior to Admission medications   Medication Sig Start Date End Date Taking? Authorizing Provider  acetaminophen (TYLENOL) 325 MG tablet Take 650 mg by mouth every 6 (six) hours as needed for moderate pain or headache.    Yes [provider]  aspirin EC 81 MG tablet Take 1 tablet (81 mg total) by mouth daily. 09/30/18  Yes Lars Masson, MD  Cholecalciferol (VITAMIN D) 50 MCG (2000 UT) CAPS Take 1 capsule by mouth daily.    Yes [provider]  fexofenadine (ALLEGRA) 180 MG tablet Take 1 tablet by mouth daily.   Yes [provider]  fluticasone (FLONASE) 50 MCG/ACT nasal spray Place 1 spray into both nostrils daily.   Yes [provider]  omeprazole (PRILOSEC) 20 MG capsule Take 1 capsule (20 mg total) by mouth daily. 08/02/19  Yes Lynann Bologna, MD  Propylene Glycol (SYSTANE BALANCE) 0.6 %  SOLN Place 1 drop into both eyes daily as needed (dry eyes).   Yes [provider]  verapamil (CALAN-SR) 240 MG CR tablet TAKE 1 TABLET DAILY 03/19/17  Yes Jake Bathe, MD  vitamin E 400 UNIT capsule Take 400 Units by mouth daily.   Yes [provider]     Family History  Problem Relation Age of Onset   Hypertension Mother    COPD Mother    Other Father        died from accident, h/o lyme disease   Colon cancer Neg Hx    Esophageal cancer Neg Hx     Social History   Socioeconomic History   Marital status: Married    Spouse name: Not on file   Number of children: Not on file   Years of education: Not on file   Highest education level: Not on file  Occupational History   Not on file  Social Needs   Financial resource strain: Not on file   Food insecurity    Worry: Not on file    Inability: Not on file   Transportation needs    Medical: Not on file    Non-medical: Not on file  Tobacco Use   Smoking status: Never Smoker   Smokeless tobacco: Never Used  Substance and Sexual Activity   Alcohol use: Yes    Alcohol/week: 0.0 standard drinks    Comment: occasionally   Drug use: No   Sexual activity: Yes    Partners: Male  Lifestyle   Physical activity    Days per week: Not on file    Minutes per session: Not on file   Stress: Not on file  Relationships   Social connections    Talks on phone: Not on file    Gets together: Not on file    Attends religious service: Not on file    Active member of club or organization: Not on file    Attends meetings of clubs or organizations: Not on file    Relationship status: Not on file  Other Topics Concern   Not on file  Social History Narrative   Not on file     Review of Systems: A 12 point ROS discussed and pertinent positives are indicated in the HPI above.  All other systems are negative.  Review of Systems  Constitutional: Negative for appetite change, chills and fever.  HENT:  Negative for nosebleeds.   Respiratory: Negative for cough and shortness of breath.   Cardiovascular: Positive for leg swelling (LLE - intermittent; swells up to hip at times). Negative for chest pain.  Gastrointestinal: Positive for abdominal pain (RUQ; constant - typically mild but intermittently worsens with activity/food) and nausea (intermittently; none currently). Negative for abdominal distention, blood in stool, diarrhea and vomiting.  Genitourinary: Negative for dysuria, flank pain and hematuria.  Musculoskeletal: Negative for back pain.  Skin: Negative for rash and wound.  Neurological: Negative for dizziness,  numbness and headaches.    Vital Signs: BP (!) 154/90    Pulse (!) 106    Temp 98.2 F (36.8 C) (Skin)    Resp 14    Ht 5\' 6"  (1.676 m)    Wt 250 lb (113.4 kg)    SpO2 98%    BMI 40.35 kg/m   Physical Exam Vitals signs reviewed.  Constitutional:      General: She is not in acute distress.    Appearance: She is not ill-appearing.  HENT:     Head: Normocephalic.     Mouth/Throat:     Mouth: Mucous membranes are moist.     Pharynx: Oropharynx is clear. No oropharyngeal exudate or posterior oropharyngeal erythema.  Eyes:     General: No scleral icterus. Cardiovascular:     Rate and Rhythm: Regular rhythm. Tachycardia present.  Pulmonary:     Effort: Pulmonary effort is normal.     Breath sounds: Normal breath sounds.  Abdominal:     General: Bowel sounds are normal. There is no distension.     Tenderness: There is abdominal tenderness (Mild RUQ TTP).  Skin:    General: Skin is warm and dry.     Coloration: Skin is not jaundiced.  Neurological:     Mental Status: She is alert and oriented to person, place, and time.  Psychiatric:        Mood and Affect: Mood normal.        Behavior: Behavior normal.        Thought Content: Thought content normal.        Judgment: Judgment normal.      MD Evaluation Airway: WNL Heart: WNL Abdomen: WNL Chest/ Lungs:  WNL ASA  Classification: 2 Mallampati/Airway Score: One   Imaging: [name] Elastography Liver  Result Date: 08/31/2019 CLINICAL DATA:  53 year old female with right upper quadrant abdominal pain and fatty liver. EXAM: US LIVER ELASTOGRAPHY TECHNIQUE: Sonography of the liver was performed. In addition, ultrasound elastography evaluation of the liver was performed. A region of interest was placed within the right lobe of the liver. Following application of a compressive sonographic pulse, tissue compressibility was assessed. Multiple assessments were performed at the selected site. Median tissue compressibility was determined. Previously, hepatic stiffness was assessed by shear wave velocity. Based on recently published Society of Radiologists in Ultrasound consensus article, reporting is now recommended to be performed in the SI units of pressure (kiloPascals) representing hepatic stiffness/elasticity. The obtained result is compared to the published reference standards. (cACLD= compensated Advanced Chronic Liver Disease) COMPARISON:  Abdominal MRI dated 06/17/2019 and CT of the abdomen pelvis dated 04/17/2019. FINDINGS: Liver: The liver demonstrates a heterogeneous echotexture with increased echogenicity. Portal vein is patent on color Doppler imaging with normal direction of blood flow towards the liver. ULTRASOUND HEPATIC ELASTOGRAPHY Device: Siemens Helix VTQ Patient position: Oblique Transducer: 6C1 Number of measurements: 10 Hepatic segment:  8 Median kPa: 45.3 IQR: 19.6 IQR/Median kPa ratio: 0.4 Data quality:  Reduced accuracy Diagnostic category:  >13 kPa: highly suggestive of cACLD IMPRESSION: ULTRASOUND LIVER: Heterogeneous with increased echogenicity. ULTRASOUND HEPATIC ELASTOGRAPHY: Median kPa:  45.3 Diagnostic category:  >13 kPa: highly suggestive of cACLD The use of hepatic elastography is applicable to patients with viral hepatitis and non-alcoholic fatty liver disease. At this time, there is  insufficient data for the referenced cut-off values and use in other causes of liver disease, including alcoholic liver disease. Patients, however, may be assessed by elastography and serve as their own reference standard/baseline.  In patients with non-alcoholic liver disease, the values suggesting compensated advanced chronic liver disease (cACLD) may be lower, and patients may need additional testing with elasticity results of 7-9 kPa. Please note that abnormal hepatic elasticity and shear wave velocities may also be identified in clinical settings other than with hepatic fibrosis, such as: acute hepatitis, elevated right heart and central venous pressures including use of beta blockers, veno-occlusive disease (Budd-Chiari), infiltrative processes such as mastocytosis/amyloidosis/infiltrative tumor/lymphoma, extrahepatic cholestasis, with hyperemia in the post-prandial state, and with liver transplantation. Correlation with patient history, laboratory data, and clinical condition recommended. Electronically Signed   By: Elgie CollardArash  Radparvar M.D.   On: 08/31/2019 10:28    Labs:  CBC: Recent Labs    09/30/18 0945 04/13/19 0515 09/04/19 0815 09/20/19 1230  WBC 5.0 6.5 4.9 6.9  HGB 13.0 13.1 13.4 14.0  HCT 39.9 41.6 41.1 44.2  PLT 199 151 188.0 196    COAGS: Recent Labs    09/04/19 0815 09/20/19 1230  INR 1.0 0.9    BMP: Recent Labs    09/30/18 0945 04/10/19 0801 04/13/19 0515 09/04/19 0815  NA 142 138 138 138  K 4.5 4.0 3.9 4.0  CL 103 108 103 104  CO2 23 21* 24 24  GLUCOSE 104* 106* 108* 111*  BUN 12 13 18 11   CALCIUM 9.5 8.8* 9.0 9.5  CREATININE 0.72 0.80 0.74 0.75  GFRNONAA 98 >60 >60  --   GFRAA 113 >60 >60  --     LIVER FUNCTION TESTS: Recent Labs    09/30/18 0945 04/10/19 0801 04/13/19 0515 09/04/19 0815  BILITOT 0.4 0.6 0.4 0.4  AST 12 14* 21 14  ALT 15 22 28 22   ALKPHOS 56 43 47 66  PROT 7.1 7.4 7.2 7.4   7.9  ALBUMIN 4.2 4.0 4.2 4.3    TUMOR  MARKERS: No results for input(s): AFPTM, CEA, CA199, CHROMGRNA in the last 8760 hours.  Assessment and Plan:  53 y/o F with persistent RUQ pain s/p cholecystectomy (1994) and fatty liver disease followed by Dr. Chales AbrahamsGupta who presents today for a random liver biopsy to further direct care.  She has been NPO since 6 am, she took an allegra this morning with a sip of water, she does not take blood thinning medications besides ASA 81 mg which she has not taken in about a week. She has requested to attempt procedure without moderate sedation at this time, however we have reviewed the sedation risks/benefits and she has an IV in place should she change her mind. Afebrile, WBC 6.9, hgb 14.0, plt 196, INR 0.9, UPT (-).  Risks and benefits of random liver biopsy was discussed with the patient and/or patient's family including, but not limited to bleeding, infection, damage to adjacent structures or low yield requiring additional tests.  All of the questions were answered and there is agreement to proceed.  Consent signed and in chart.  Electronically Signed: Villa HerbShannon A Juletta Berhe, PA-C 09/20/2019, 12:57 PM   I spent a total of30 Minutes   in face to face in clinical consultation, greater than 50% of which was counseling/coordinating care for random liver biopsy.

## 2019-09-20 NOTE — Sedation Documentation (Signed)
Patient has requested no sedation for this procedure. IR nurse will stay in the room and monitor vitals, prepared to give sedation if patient changes her mind.  Soyla Dryer RN

## 2019-09-20 NOTE — Sedation Documentation (Signed)
Patient did not receive any sedation for procedure. She is alert and oriented x3 with stable vitals. Patient will be transported to [facility] room 3.  - Soyla Dryer RN

## 2019-09-21 ENCOUNTER — Other Ambulatory Visit: Payer: Self-pay | Admitting: Physician Assistant

## 2019-09-22 LAB — SURGICAL PATHOLOGY

## 2019-09-25 ENCOUNTER — Other Ambulatory Visit: Payer: Self-pay

## 2019-10-03 ENCOUNTER — Other Ambulatory Visit: Payer: BC Managed Care – PPO

## 2019-10-05 ENCOUNTER — Telehealth: Payer: Self-pay | Admitting: Gastroenterology

## 2019-10-05 ENCOUNTER — Other Ambulatory Visit: Payer: BC Managed Care – PPO

## 2019-10-05 NOTE — Telephone Encounter (Signed)
Called and spoke with patient (at length) patient has a lot of questions regarding her patho results- -patient is requesting to have the "enitre comment section summarized" from patho results;  How much of my liver is damaged (size/amount)? -beyond the 10% fatty liver -can it be improved with lifestyle changes?-"more than I have already done" -how does this correlate to the elastography-still needing to talk about liver transplant/referral to liver clinic?  -are you able to identify which medications are causing my liver damage-and would I be able to stop taking them?  -blood test-RBC elevated RDW elevated-what does this mean and do I need to see a specialist for this?  -plan of action-I understand diet and exercise-what else can be done  -Do I need to be referred to a hepatologist for all of this?  Please advise

## 2019-10-05 NOTE — Telephone Encounter (Signed)
Pt is returning call regarding this msg

## 2019-10-05 NOTE — Telephone Encounter (Signed)
Pt returned your call.  

## 2019-10-05 NOTE — Telephone Encounter (Signed)
Patient returned call to the office at 10:30 am; RN unavailable; will attempt to reach patient at a later date/time;

## 2019-10-05 NOTE — Telephone Encounter (Signed)
Left message for patient to call back to the office;  

## 2019-10-05 NOTE — Telephone Encounter (Signed)
How much of my liver is damaged (size/amount)?  Per biopsy-it is pretty much normal.  Only 10% fatty liver.  -can it be improved with lifestyle changes?-"more than I have already done".  Certainly, it would improve with lifestyle changes including gradual weight loss, avoid fatty foods, exercise.   -how does this correlate to the elastography-still needing to talk about liver transplant/referral to liver clinic?  No need for liver transplant or referral to liver clinic.  Elastography did not correlate with liver biopsy.  Liver biopsy is much more accurate.   -are you able to identify which medications are causing my liver damage-and would I be able to stop taking them?  None   -blood test-RBC elevated RDW elevated-what does this mean and do I need to see a specialist for this?  No need to from GI standpoint.  Elevated RDW is not GI related.  Please get in touch with family physician.   -plan of action-I understand diet and exercise-what else can be done.  That is it pretty much for now.  No medications for liver specifically.   -Do I need to be referred to a hepatologist for all of this?  Not exactly.  They are not going to do anything differently.  But if she still wants, for her peace of mind, more than happy to send her to hepatologist.  Bre, pl liver biopsy report to her as well.  In case, she has not received it.  RG

## 2019-10-09 ENCOUNTER — Other Ambulatory Visit: Payer: BC Managed Care – PPO

## 2019-10-10 ENCOUNTER — Other Ambulatory Visit (INDEPENDENT_AMBULATORY_CARE_PROVIDER_SITE_OTHER): Payer: BC Managed Care – PPO

## 2019-10-10 ENCOUNTER — Other Ambulatory Visit: Payer: Self-pay

## 2019-10-10 DIAGNOSIS — K760 Fatty (change of) liver, not elsewhere classified: Secondary | ICD-10-CM

## 2019-10-10 DIAGNOSIS — R1011 Right upper quadrant pain: Secondary | ICD-10-CM

## 2019-10-10 LAB — HEMOCCULT SLIDES (X 3 CARDS)
Fecal Occult Blood: NEGATIVE
OCCULT 1: NEGATIVE
OCCULT 2: NEGATIVE
OCCULT 3: NEGATIVE
OCCULT 4: NEGATIVE
OCCULT 5: NEGATIVE

## 2019-10-17 ENCOUNTER — Telehealth (INDEPENDENT_AMBULATORY_CARE_PROVIDER_SITE_OTHER): Payer: BC Managed Care – PPO | Admitting: Gastroenterology

## 2019-10-17 ENCOUNTER — Other Ambulatory Visit: Payer: Self-pay

## 2019-10-17 ENCOUNTER — Encounter: Payer: Self-pay | Admitting: Gastroenterology

## 2019-10-17 VITALS — Ht 66.0 in | Wt 250.0 lb

## 2019-10-17 DIAGNOSIS — K760 Fatty (change of) liver, not elsewhere classified: Secondary | ICD-10-CM | POA: Diagnosis not present

## 2019-10-17 DIAGNOSIS — R1011 Right upper quadrant pain: Secondary | ICD-10-CM

## 2019-10-17 NOTE — Patient Instructions (Signed)
If you are age 51 or older, your body mass index should be between 23-30. Your Body mass index is 40.35 kg/m. If this is out of the aforementioned range listed, please consider follow up with your Primary Care Provider.  If you are age 38 or younger, your body mass index should be between 19-25. Your Body mass index is 40.35 kg/m. If this is out of the aformentioned range listed, please consider follow up with your Primary Care Provider.   Continue to maintain weight.   Continue exercising.   Continue Omeprazole   It has been recommended to you by your physician that you have a(n) EGD completed. Per your request, we did not schedule the procedure(s) today. Please contact our office at [phone] should you decide to have the procedure completed. You will be scheduled for a pre-visit and procedure at that time.   Thank you,  Dr. Jackquline Denmark

## 2019-10-17 NOTE — Progress Notes (Signed)
Chief Complaint:   Referring Provider:  Macy Mis, MD      ASSESSMENT AND PLAN;   #1. Fatty liver with Nl LFTs (liver Bx 03/2020 <10% fatty liver, no NASH). AST:plt (APRI) ratio of 0.3. Alb 4.2 (03/2019). No ETOH. Has assoc Obesity, HLD. No DM.   #2.RUQ pain. ? Etiology. Likely musculoskeltal. R/O other causes. Neg UGI series except small HH 07/04/2019, neg CT and then MRI/MRCP 06/17/2019 except for small hepatic cysts and fatty liver.  #3. GERD with small HH with occasional dysphagia  #4. Colorectal cancer screening.   Plan:  -Continue to maintain current weight  -Continue exercising.   -Continue omeprazole 20 mg p.o. once a day.   -Recommend EGD with possible dil if needed/screening colonoscopy for further evaluation.  She would let us know when she decides to get it performed.   -Follow-up in 12 weeks.  Can do televisit.     HPI:    Ruth Matthews is a 53 y.o. female  For follow-up  RUQ pain, worse after eating. Occ nausea, no vomiting.  Does feel full especially after eating large or fatty meals.  Better with Tylenol.  Occasional dysphagia.  Heartburn and indigestion is better with omeprazole. S/p cholecystectomy 1994.  No vomiting, heartburn, regurgitation, odynophagia. No significant diarrhea or constipation (occ with meds).  No melena or hematochezia. No unintentional weight loss.  USE showed ?significant fatty liver disease with kPA of 45.3 s/o advanced liver disease.  Looks like it was false positive.  Labs were all normal.  She underwent liver biopsy which showed less than 10% steatosis without NASH.  I have gone over in detail with her regarding liver biopsy.  She had multiple questions. I have answered all those to the best of my ability.  I have reassured her multiple times.  I have offered her another opinion with hepatologist as well, if she would like (just for peace of mind).  She will think about it.  Neg UGI series except small HH 07/04/2019, neg CT and  then MRI/MRCP 06/17/2019 except for small hepatic cysts and fatty liver.  Has joined [facility] thru [facility].  And has been able to lose 9 pounds since the last visit.  She has been exercising every day.  COVID-19 did limit personal interaction.  Is under considerable stress due to mother-in-law in hospice - nor doing well.  No nonsteroidals.  No history of itching, skin lesions, easy bruisability, intake of over-the-counter medications including diet pills, herbal medications, anabolic steroids or Tylenol.  There is no history of blood transfusions, IV drug use or family history of liver disease.  No jaundice dark urine or pale stools.    No history of alcohol use/abuse.  Wt Readings from Last 3 Encounters:  10/17/19 250 lb (113.4 kg)  09/20/19 250 lb (113.4 kg)  08/31/19 252 lb (114.3 kg)     Past Medical History:  Diagnosis Date  . Anxiety   . DVT (deep venous thrombosis) (HCC)   . Elevated fasting blood sugar   . Gallstone   . Gestational diabetes 2000  . Hypertension   . 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 Anne Fu, Dr. Graciela Husbands  . Obesity   . Ovarian cyst    ruptured  . RMSF [facility] spotted fever) 2004   history of   . Varicose veins   . Ventricular tachycardia (HCC)   . Vitamin D deficiency     Past Surgical History:  Procedure Laterality Date  .  CARDIAC CATHETERIZATION     no CAD  . CESAREAN SECTION    . CHOLECYSTECTOMY    . ENDOVENOUS ABLATION SAPHENOUS VEIN W/ LASER Bilateral 02/2011   Both legs a few weeks apart    Family History  Problem Relation Age of Onset  . Hypertension Mother   . COPD Mother   . Other Father        died from accident, h/o lyme disease  . Colon cancer Neg Hx   . Esophageal cancer Neg Hx     Social History   Tobacco Use  . Smoking status: Never Smoker  . Smokeless tobacco: Never Used  Substance Use Topics  . Alcohol use: Yes    Alcohol/week: 0.0 standard drinks    Comment: occasionally  . Drug use:  No    Current Outpatient Medications  Medication Sig Dispense Refill  . Cholecalciferol (VITAMIN D) 50 MCG (2000 UT) CAPS Take 1 capsule by mouth daily.     . fexofenadine (ALLEGRA) 180 MG tablet Take 1 tablet by mouth daily.    . fluticasone (FLONASE) 50 MCG/ACT nasal spray Place 1 spray into both nostrils daily.    Marland Kitchen omeprazole (PRILOSEC) 20 MG capsule Take 1 capsule (20 mg total) by mouth daily. 30 capsule 6  . Propylene Glycol (SYSTANE BALANCE) 0.6 % SOLN Place 1 drop into both eyes daily as needed (dry eyes).    . verapamil (CALAN-SR) 240 MG CR tablet TAKE 1 TABLET DAILY 30 tablet 0  . vitamin E 400 UNIT capsule Take 400 Units by mouth daily.    Marland Kitchen acetaminophen (TYLENOL) 325 MG tablet Take 650 mg by mouth every 6 (six) hours as needed for moderate pain or headache.     Marland Kitchen aspirin EC 81 MG tablet Take 1 tablet (81 mg total) by mouth daily. (Patient not taking: Reported on 10/17/2019) 90 tablet 3   No current facility-administered medications for this visit.    Allergies  Allergen Reactions  . Latex Itching, Swelling and Rash  . Azithromycin     Upset stomach   . Levaquin [Levofloxacin]     Review of Systems:  Has situational anxiety.     Physical Exam:    There were no vitals taken for this visit. There were no vitals filed for this visit. Constitutional:  Well-developed, in no acute distress. Psychiatric: Normal mood and affect. Behavior is normal. Televisit  Data Reviewed: I have personally reviewed following labs and imaging studies  CBC: CBC Latest Ref Rng & Units 09/20/2019 09/04/2019 04/13/2019  WBC 4.0 - 10.5 K/uL 6.9 4.9 6.5  Hemoglobin 12.0 - 15.0 g/dL 14.0 13.4 13.1  Hematocrit 36.0 - 46.0 % 44.2 41.1 41.6  Platelets 150 - 400 K/uL 196 188.0 151    CMP: CMP Latest Ref Rng & Units 09/04/2019 09/04/2019 04/13/2019  Glucose 70 - 99 mg/dL - 111(H) 108(H)  BUN 6 - 23 mg/dL - 11 18  Creatinine 0.40 - 1.20 mg/dL - 0.75 0.74  Sodium 135 - 145 mEq/L - 138 138    Potassium 3.5 - 5.1 mEq/L - 4.0 3.9  Chloride 96 - 112 mEq/L - 104 103  CO2 19 - 32 mEq/L - 24 24  Calcium 8.4 - 10.5 mg/dL - 9.5 9.0  Total Protein 6.1 - 8.1 g/dL 7.4 7.9 7.2  Total Bilirubin 0.2 - 1.2 mg/dL - 0.4 0.4  Alkaline Phos 39 - 117 U/L - 66 47  AST 0 - 37 U/L - 14 21  ALT 0 - 35  U/L - 22 28    GFR  Radiology Studies: Liver Biopsy  Result Date: 09/20/2019 INDICATION: Elevated liver function tests and need for liver biopsy. EXAM: ULTRASOUND GUIDED CORE BIOPSY OF LIVER MEDICATIONS: None. ANESTHESIA/SEDATION: None PROCEDURE: The procedure, risks, benefits, and alternatives were explained to the patient. Questions regarding the procedure were encouraged and answered. The patient understands and consents to the procedure. A time-out was performed prior to initiating the procedure. The right abdominal wall was prepped with chlorhexidine in a sterile fashion, and a sterile drape was applied covering the operative field. A sterile gown and sterile gloves were used for the procedure. Local anesthesia was provided with 1% Lidocaine. Under ultrasound guidance, a 17 gauge trocar needle was advanced into the right lobe of the liver. After confirming needle tip position, 2 separate coaxial 18 gauge core biopsy samples were obtained and submitted in formalin. Gel-Foam pledgets were advanced through the outer needle as it was retracted. Postprocedural ultrasound was performed. COMPLICATIONS: None immediate. FINDINGS: Solid core biopsy samples were obtained from the liver parenchyma. IMPRESSION: Ultrasound-guided core biopsy performed within the right lobe of the liver. Electronically Signed   By: Irish LackGlenn  Yamagata M.D.   On: 09/20/2019 17:07    I connected with  Cyndia DiverEden E Brandhorst on 10/17/19 by a video enabled telemedicine application and verified that I am speaking with the correct person.   I discussed the limitations of evaluation and management by telemedicine. The patient expressed understanding and  agreed to proceed.  Time spent: 15 minutes       Edman Circleaj Lailee Hoelzel, MD 10/17/2019, 1:28 PM  Cc: Macy MisBriscoe, Kim K, MD

## 2019-11-30 ENCOUNTER — Telehealth: Payer: Self-pay | Admitting: Gastroenterology

## 2019-11-30 DIAGNOSIS — R1011 Right upper quadrant pain: Secondary | ICD-10-CM

## 2019-11-30 DIAGNOSIS — K760 Fatty (change of) liver, not elsewhere classified: Secondary | ICD-10-CM

## 2019-11-30 NOTE — Telephone Encounter (Signed)
Please review previous message from patient and advise

## 2019-11-30 NOTE — Telephone Encounter (Signed)
Pt stated that she had discussed with Dr. Chales Abrahams about referral to a hepatologist and would like that arranged.  She also has questions regarding her medications:  1. What antibiotics to take and which to avoid for bronchitis.  Pt currently experiencing nausea and indigestion.  2. OK to take sertraline with her liver condition?  3. Pt would like prescription for omeprazole to be sent to [facility] as it will be cheaper than getting it at [facility].

## 2019-12-03 NOTE — Telephone Encounter (Signed)
Pt stated that she had discussed with Dr. Chales Abrahams about referral to a hepatologist and would like that arranged.   -Please go ahead and arrange for hepatology consultation at [facility]. RE: fatty liver.  Also please send pathology slides for review to [facility] pathology.  May have to call pathology lab here to do that.     She also has questions regarding her medications:   1. What antibiotics to take and which to avoid for bronchitis.  Pt currently experiencing nausea and indigestion.  -From liver standpoint, she can take any antibiotics. -For nausea/indigestion/occasional dysphagia-recommend EGD.  Also recommend screening colonoscopy at the same time (see previous note)   2. OK to take sertraline with her liver condition? -Okay to use sertraline with fatty liver.  Her fatty liver is very mild.   3. Pt would like prescription for omeprazole to be sent to [facility] as it will be cheaper than getting it at [facility].  -Bre, please send in prescription of omeprazole 20 mg p.o. once a day, 90, 4 [facility]   Thx  RG

## 2019-12-04 MED ORDER — OMEPRAZOLE 20 MG PO CPDR: 20.0000 mg | DELAYED_RELEASE_CAPSULE | Freq: Every day | ORAL | 4 refills | Status: DC

## 2019-12-04 NOTE — Telephone Encounter (Signed)
Called and spoke with Bjorn Loser, at [facility] patho, she reports that when patient is seen at [facility] (accepted as a pt) then [facility] will fax a request for these slides if they need them (once the patient has been scheduled);  Referral has been faxed to [facility], Dr. Pollyann Samples for a consult;  Patient has been given this information via MyChart;

## 2019-12-05 NOTE — Telephone Encounter (Signed)
Referral information has been received by [facility]; awaiting appt date/time;

## 2019-12-11 NOTE — Telephone Encounter (Signed)
[facility] Dr. Orvilla Fus office has received referral information and will contact the patient with an appt date once information is entered in their system;

## 2020-02-23 DIAGNOSIS — K219 Gastro-esophageal reflux disease without esophagitis: Secondary | ICD-10-CM | POA: Insufficient documentation

## 2020-04-12 ENCOUNTER — Other Ambulatory Visit: Payer: Self-pay

## 2020-04-15 ENCOUNTER — Encounter: Payer: Self-pay | Admitting: Obstetrics & Gynecology

## 2020-04-15 ENCOUNTER — Other Ambulatory Visit: Payer: Self-pay

## 2020-04-15 ENCOUNTER — Ambulatory Visit (INDEPENDENT_AMBULATORY_CARE_PROVIDER_SITE_OTHER): Payer: BC Managed Care – PPO | Admitting: Obstetrics & Gynecology

## 2020-04-15 VITALS — BP 140/86 | Ht 65.5 in | Wt 238.0 lb

## 2020-04-15 DIAGNOSIS — Z6839 Body mass index (BMI) 39.0-39.9, adult: Secondary | ICD-10-CM

## 2020-04-15 DIAGNOSIS — Z789 Other specified health status: Secondary | ICD-10-CM | POA: Diagnosis not present

## 2020-04-15 DIAGNOSIS — N951 Menopausal and female climacteric states: Secondary | ICD-10-CM | POA: Diagnosis not present

## 2020-04-15 DIAGNOSIS — E6609 Other obesity due to excess calories: Secondary | ICD-10-CM

## 2020-04-15 DIAGNOSIS — Z01419 Encounter for gynecological examination (general) (routine) without abnormal findings: Secondary | ICD-10-CM | POA: Diagnosis not present

## 2020-04-15 MED ORDER — NORETHINDRONE 0.35 MG PO TABS: 1.0000 | ORAL_TABLET | Freq: Every day | ORAL | 4 refills | Status: DC

## 2020-04-15 NOTE — Progress Notes (Signed)
Ruth Matthews [birth year] [id number]   History:    54 y.o. G2P2L2 Married.  Delivered her daughter who is now 21 yo.  RP:  Abdominal pain persisting post ruptured ovarian cyst on 04/10/2019  HPI:  Oligomenorrhea every 2-4 months with normal flow.  Intermittent hot flushes with anxiety.  Followed and treated for Depression.  No suicidal ideation.  No pelvic pain.  No pain with IC.  Using condoms.  No UTI Sx. BMs normal. Breasts normal.  BMI 39.0.  Walking.  Fasting health labs with Fam MD.  Needs to schedule screening [place].   Past medical history,surgical history, family history and social history were all reviewed and documented in the EPIC chart.  Gynecologic History Patient's last menstrual period was 04/14/2020.  Obstetric History OB History  Gravida Para Term Preterm AB Living  2 2       2   SAB TAB Ectopic Multiple Live Births               # Outcome Date GA Lbr Len/2nd Weight Sex Delivery Anes PTL Lv  2 Para           1 Para              ROS: A ROS was performed and pertinent positives and negatives are included in the history.  GENERAL: No fevers or chills. HEENT: No change in vision, no earache, sore throat or sinus congestion. NECK: No pain or stiffness. CARDIOVASCULAR: No chest pain or pressure. No palpitations. PULMONARY: No shortness of breath, cough or wheeze. GASTROINTESTINAL: No abdominal pain, nausea, vomiting or diarrhea, melena or bright red blood per rectum. GENITOURINARY: No urinary frequency, urgency, hesitancy or dysuria. MUSCULOSKELETAL: No joint or muscle pain, no back pain, no recent trauma. DERMATOLOGIC: No rash, no itching, no lesions. ENDOCRINE: No polyuria, polydipsia, no heat or cold intolerance. No recent change in weight. HEMATOLOGICAL: No anemia or easy bruising or bleeding. NEUROLOGIC: No headache, seizures, numbness, tingling or weakness. PSYCHIATRIC: No depression, no loss of interest in normal activity or change in sleep pattern.      Exam:   BP 140/86   Ht 5' 5.5" (1.664 m)   Wt 238 lb (108 kg)   LMP 04/14/2020   BMI 39.00 kg/m   Body mass index is 39 kg/m.  General appearance : Well developed well nourished female. No acute distress HEENT: Eyes: no retinal hemorrhage or exudates,  Neck supple, trachea midline, no carotid bruits, no thyroidmegaly Lungs: Clear to auscultation, no rhonchi or wheezes, or rib retractions  Heart: Regular rate and rhythm, no murmurs or gallops Breast:Examined in sitting and supine position were symmetrical in appearance, no palpable masses or tenderness,  no skin retraction, no nipple inversion, no nipple discharge, no skin discoloration, no axillary or supraclavicular lymphadenopathy Abdomen: no palpable masses or tenderness, no rebound or guarding Extremities: no edema or skin discoloration or tenderness  Pelvic: Vulva: Normal             Vagina: No gross lesions or discharge  Cervix: No gross lesions or discharge.  Pap reflex done.  Uterus  AV, normal size, shape and consistency, non-tender and mobile  Adnexa  Without masses or tenderness  Anus: Normal   Assessment/Plan:  54 y.o. female for annual exam   1. Encounter for routine gynecological examination with Papanicolaou smear of cervix Normal gynecologic exam.  Pap reflex done.  Breast exam normal.  Needs to schedule a screening mammogram now.  Health labs with family physician.  2. Use of condoms for contraception  3. Perimenopause Oligomenorrhea with menses with normal flow every 2 to 4 months.  Will observe.  Precautions for abnormal uterine bleeding given.  4. Class 2 obesity due to excess calories without serious comorbidity with body mass index (BMI) of 39.0 to 39.9 in adult Recommend a lower calorie/carb diet such as [employer].  Aerobic activities 5 times a week and light weightlifting every 2 days.  Other orders - sertraline (ZOLOFT) 50 MG tablet; Take 75 mg by mouth daily. 1.5 pill a day -  norethindrone (MICRONOR) 0.35 MG tablet; Take 1 tablet (0.35 mg total) by mouth daily.  Princess Bruins MD, 8:48 AM 04/15/2020

## 2020-04-16 LAB — PAP IG W/ RFLX HPV ASCU

## 2020-04-19 ENCOUNTER — Encounter: Payer: Self-pay | Admitting: Obstetrics & Gynecology

## 2020-04-19 NOTE — Patient Instructions (Signed)
1. Encounter for routine gynecological examination with Papanicolaou smear of cervix Normal gynecologic exam.  Pap reflex done.  Breast exam normal.  Needs to schedule a screening mammogram now.  Health labs with family physician.  2. Use of condoms for contraception  3. Perimenopause Oligomenorrhea with menses with normal flow every 2 to 4 months.  Will observe.  Precautions for abnormal uterine bleeding given.  4. Class 2 obesity due to excess calories without serious comorbidity with body mass index (BMI) of 39.0 to 39.9 in adult Recommend a lower calorie/carb diet such as [employer].  Aerobic activities 5 times a week and light weightlifting every 2 days.  Other orders - sertraline (ZOLOFT) 50 MG tablet; Take 75 mg by mouth daily. 1.5 pill a day - norethindrone (MICRONOR) 0.35 MG tablet; Take 1 tablet (0.35 mg total) by mouth daily.  Norely, it was a pleasure seeing you today!  I will inform you of your results as soon as they are available.

## 2020-04-30 ENCOUNTER — Other Ambulatory Visit: Payer: Self-pay | Admitting: Family Medicine

## 2020-04-30 DIAGNOSIS — Z1231 Encounter for screening mammogram for malignant neoplasm of breast: Secondary | ICD-10-CM

## 2020-05-07 ENCOUNTER — Other Ambulatory Visit: Payer: Self-pay

## 2020-05-07 ENCOUNTER — Ambulatory Visit
Admission: RE | Admit: 2020-05-07 | Discharge: 2020-05-07 | Disposition: A | Discharge status: Home / Self Care | Payer: BC Managed Care – PPO | Source: Ambulatory Visit | Attending: Family Medicine | Admitting: Family Medicine

## 2020-05-07 DIAGNOSIS — Z1231 Encounter for screening mammogram for malignant neoplasm of breast: Secondary | ICD-10-CM

## 2020-06-07 ENCOUNTER — Other Ambulatory Visit: Payer: Self-pay

## 2020-06-07 MED ORDER — NORETHINDRONE 0.35 MG PO TABS: 1.0000 | ORAL_TABLET | Freq: Every day | ORAL | 3 refills | Status: DC

## 2020-06-11 DIAGNOSIS — H9312 Tinnitus, left ear: Secondary | ICD-10-CM | POA: Insufficient documentation

## 2020-06-11 DIAGNOSIS — H9042 Sensorineural hearing loss, unilateral, left ear, with unrestricted hearing on the contralateral side: Secondary | ICD-10-CM | POA: Insufficient documentation

## 2020-06-11 DIAGNOSIS — K219 Gastro-esophageal reflux disease without esophagitis: Secondary | ICD-10-CM | POA: Insufficient documentation

## 2020-06-11 DIAGNOSIS — J358 Other chronic diseases of tonsils and adenoids: Secondary | ICD-10-CM | POA: Insufficient documentation

## 2020-06-13 ENCOUNTER — Other Ambulatory Visit: Payer: Self-pay

## 2020-06-13 DIAGNOSIS — I83893 Varicose veins of bilateral lower extremities with other complications: Secondary | ICD-10-CM

## 2020-06-21 ENCOUNTER — Encounter: Payer: Self-pay | Admitting: Vascular Surgery

## 2020-06-21 ENCOUNTER — Other Ambulatory Visit: Payer: Self-pay

## 2020-06-21 ENCOUNTER — Ambulatory Visit: Payer: BC Managed Care – PPO | Admitting: Vascular Surgery

## 2020-06-21 ENCOUNTER — Ambulatory Visit (HOSPITAL_COMMUNITY)
Admission: RE | Admit: 2020-06-21 | Discharge: 2020-06-21 | Disposition: A | Discharge status: Home / Self Care | Payer: BC Managed Care – PPO | Source: Ambulatory Visit | Attending: Vascular Surgery | Admitting: Vascular Surgery

## 2020-06-21 VITALS — BP 147/85 | HR 68 | Temp 98.2°F | Resp 20 | Ht 65.0 in | Wt 233.0 lb

## 2020-06-21 DIAGNOSIS — I83893 Varicose veins of bilateral lower extremities with other complications: Secondary | ICD-10-CM

## 2020-06-21 DIAGNOSIS — R600 Localized edema: Secondary | ICD-10-CM | POA: Diagnosis not present

## 2020-06-21 NOTE — Progress Notes (Signed)
Patient ID: Ruth Matthews, female   DOB: Aug 15, 1966, 54 y.o.   MRN: [id number]  Reason for Consult: No chief complaint on file.   Referred by Macy Mis, MD  Subjective:     HPI:  Ruth Matthews is a 54 y.o. female history of bilateral greater saphenous vein ablations.  After the right side she did have a DVT.  She is also had superficial thrombophlebitis in the past.  Denies any other DVTs or PEs.  Now presents for left greater than right lower extremity swelling and discomfort.  Especially on the right side she has tightness of her calf when she walks.  This is associated with swelling.  She also has varicose veins on the right bilateral spider veins.  She has not had any bleeding from these.  Past Medical History:  Diagnosis Date  . Anxiety   . DVT (deep venous thrombosis) (HCC)   . Elevated fasting blood sugar   . Gallstone   . Gestational diabetes 2000  . Hypertension   . 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 Anne Fu, Dr. Graciela Husbands  . Obesity   . Ovarian cyst    ruptured  . RMSF [facility] spotted fever) 2004   history of   . Varicose veins   . Ventricular tachycardia (HCC)   . Vitamin D deficiency    Family History  Problem Relation Age of Onset  . Hypertension Mother   . COPD Mother   . Other Father        died from accident, h/o lyme disease  . Colon cancer Neg Hx   . Esophageal cancer Neg Hx    Past Surgical History:  Procedure Laterality Date  . CARDIAC CATHETERIZATION     no CAD  . CESAREAN SECTION    . CHOLECYSTECTOMY    . ENDOVENOUS ABLATION SAPHENOUS VEIN W/ LASER Bilateral 02/2011   Both legs a few weeks apart  . LIVER BIOPSY      Short Social History:  Social History   Tobacco Use  . Smoking status: Never Smoker  . Smokeless tobacco: Never Used  Substance Use Topics  . Alcohol use: Yes    Alcohol/week: 0.0 standard drinks    Comment: occasionally    Allergies  Allergen Reactions  . Latex Itching,  Swelling and Rash  . Azithromycin     Upset stomach   . Levaquin [Levofloxacin]     Current Outpatient Medications  Medication Sig Dispense Refill  . acetaminophen (TYLENOL) 325 MG tablet Take 650 mg by mouth every 6 (six) hours as needed for moderate pain or headache.     . Cholecalciferol (VITAMIN D) 50 MCG (2000 UT) CAPS Take 1 capsule by mouth daily.     . fexofenadine (ALLEGRA) 180 MG tablet Take 1 tablet by mouth daily.    . fluticasone (FLONASE) 50 MCG/ACT nasal spray Place 1 spray into both nostrils daily.    . norethindrone (MICRONOR) 0.35 MG tablet Take 1 tablet (0.35 mg total) by mouth daily. 84 tablet 3  . Propylene Glycol (SYSTANE BALANCE) 0.6 % SOLN Place 1 drop into both eyes daily as needed (dry eyes).    . sertraline (ZOLOFT) 50 MG tablet Take 75 mg by mouth daily. 1.5 pill a day    . verapamil (CALAN-SR) 240 MG CR tablet TAKE 1 TABLET DAILY 30 tablet 0  . vitamin E 400 UNIT capsule Take 400 Units by mouth daily.     No current facility-administered medications for  this visit.    Review of Systems  Constitutional:  Constitutional negative. HENT: HENT negative.  Eyes: Eyes negative.  Respiratory: Respiratory negative.  Cardiovascular: Positive for leg swelling.  GI: Gastrointestinal negative.  Musculoskeletal: Positive for leg pain.  Skin: Skin negative.  Neurological: Neurological negative. Hematologic: Hematologic/lymphatic negative.  Psychiatric: Psychiatric negative.        Objective:  Objective  Vitals:   06/21/20 1418  BP: (!) 147/85  Pulse: 68  Resp: 20  Temp: 98.2 F (36.8 C)  SpO2: 98%     Physical Exam HENT:     Head: Normocephalic.     Nose:     Comments: Wearing a mask Eyes:     Extraocular Movements: Extraocular movements intact.     Pupils: Pupils are equal, round, and reactive to light.  Cardiovascular:     Pulses: Normal pulses.  Pulmonary:     Effort: Pulmonary effort is normal.  Abdominal:     General: Abdomen is flat.      Palpations: Abdomen is soft.  Skin:    General: Skin is warm and dry.     Capillary Refill: Capillary refill takes less than 2 seconds.  Neurological:     General: No focal deficit present.     Mental Status: She is alert.  Psychiatric:        Mood and Affect: Mood normal.        Thought Content: Thought content normal.        Judgment: Judgment normal.     Data: Reflux study summary:  Right:  - No evidence of deep vein thrombosis seen in the right lower extremity,  from the common femoral through the popliteal veins.  - Venous reflux is noted in the right common femoral vein.  - Venous reflux is noted in the right sapheno-femoral junction.  - Venous reflux is noted in segments of the right greater saphenous vein  in the thigh, ablated.  - Venous reflux is noted in the right femoral vein.  - Venous reflux is noted in the right short saphenous vein.    Left:  - No evidence of deep vein thrombosis seen in the left lower extremity,  from the common femoral through the popliteal veins.  - Venous reflux is noted in the left common femoral vein.  - Venous reflux is noted in the left sapheno-femoral junction.  - Venous reflux is noted in segments of the left greater saphenous vein in  the thigh, ablated.  - Venous reflux is noted in the left femoral vein.  - Venous reflux is noted in the left popliteal vein.  - Venous reflux is noted in the left short saphenous vein.      Assessment/Plan:     54 year old female with previous history of bilateral greater saphenous vein ablation.  She has persistent swelling left greater than right with varicose veins right greater than left.  She thinks that the swelling is likely giving her more symptoms in the varicose veins.  With this I would not offer sclerotherapy and instead have discussed compression stockings.  She is to wear these when out of bed to control her swelling.  She does have deep venous reflux and possibly has a component of  obstruction we discussed possible CT venogram versus invasive venography.  I think this is unlikely to be abnormal in reviewing her previous CT scan of hers I was noncontrasted in June 2020.  She will wear compression stockings hopefully this will help her symptoms she can  follow-up with me on an as-needed basis.     Maeola Harman MD [facility]

## 2020-07-24 ENCOUNTER — Ambulatory Visit: Payer: BC Managed Care – PPO | Admitting: Cardiology

## 2020-07-24 ENCOUNTER — Other Ambulatory Visit: Payer: Self-pay

## 2020-07-24 VITALS — BP 122/62 | HR 89 | Ht 65.0 in | Wt 238.0 lb

## 2020-07-24 DIAGNOSIS — E559 Vitamin D deficiency, unspecified: Secondary | ICD-10-CM

## 2020-07-24 DIAGNOSIS — E785 Hyperlipidemia, unspecified: Secondary | ICD-10-CM

## 2020-07-24 DIAGNOSIS — I472 Ventricular tachycardia, unspecified: Secondary | ICD-10-CM

## 2020-07-24 DIAGNOSIS — I4729 Other ventricular tachycardia: Secondary | ICD-10-CM

## 2020-07-24 DIAGNOSIS — R0600 Dyspnea, unspecified: Secondary | ICD-10-CM

## 2020-07-24 DIAGNOSIS — R0609 Other forms of dyspnea: Secondary | ICD-10-CM

## 2020-07-24 NOTE — Patient Instructions (Signed)
Medication Instructions:   Your physician recommends that you continue on your current medications as directed. Please refer to the Current Medication list given to you today.  *If you need a refill on your cardiac medications before your next appointment, please call your pharmacy*  Lab Work:  TODAY--CMET, CBC, LIPIDS, CRP, HEMOGLOBIN A1C, VITAMIN D LEVEL   If you have labs (blood work) drawn today and your tests are completely normal, you will receive your results only by: Marland Kitchen MyChart Message (if you have MyChart) OR . A paper copy in the mail If you have any lab test that is abnormal or we need to change your treatment, we will call you to review the results.  Testing/Procedures:  Your physician has requested that you have an echocardiogram. Echocardiography is a painless test that uses sound waves to create images of your heart. It provides your doctor with information about the size and shape of your heart and how well your heart's chambers and valves are working. This procedure takes approximately one hour. There are no restrictions for this procedure.  Your physician has requested that you have an exercise tolerance test. For further information please visit [email or web address] Please also follow instruction sheet, as given.  Follow-Up:  2 MONTHS WITH AN EXTENDER IN THE OFFICE

## 2020-07-24 NOTE — Progress Notes (Signed)
Cardiology Office Note:    Date:  07/25/2020   ID:  Alphonse Guild, [place] 08/28/1966, MRN [id number]  PCP:  Katherina Mires, MD  Cardiologist:  New patient - Dr Meda Coffee  Referring MD: Katherina Mires, MD   Chief complain: chest pain, fatigue, palpitations  History of Present Illness:    Ruth Matthews is a 53 y.o. 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 She was previously seen by Dr Marlou Porch for palpitations and exertional presyncope at a gym in 2015, she was diagnosed with  Wide complex tachycardia negatively deflecting QRS complexes in the lateral leads as well as right axis deviation and negative deflection in 23 aVF. She was given an IV and amiodarone. She converted after about 6 minutes. Briefly she was in atrial fibrillation postconversion and then remained in sinus rhythm for the hospitalization. Dr. Caryl Comes recommended verapamil for treatment of this left fascicular VT. Cardiac MRI was unremarkable. Echocardiogram unremarkable. Cardiac catheterization unremarkable, no CAD.  She is coming back for follow up, she feels very fatigues, experiences DOE an palpitations almost every time she is performing moderate activity. Some dizziness, no syncope. Occassional chest tightness. She was hospitalized a year ago and was diagnosed with ascites, she is seeing an allergist for generalized inflammation. She was found to have significantly elevated CRP.  Past Medical History:  Diagnosis Date  . Anxiety   . DVT (deep venous thrombosis) ([place])   . Elevated fasting blood sugar   . Gallstone   . Gestational diabetes 2000  . Hypertension   . 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 Marlou Porch, Dr. Caryl Comes  . Obesity   . Ovarian cyst    ruptured  . RMSF [facility] spotted fever) 2004   history of   . Varicose veins   . Ventricular tachycardia ([place])   . Vitamin D deficiency     Past Surgical History:    Procedure Laterality Date  . CARDIAC CATHETERIZATION     no CAD  . CESAREAN SECTION    . CHOLECYSTECTOMY    . ENDOVENOUS ABLATION SAPHENOUS VEIN W/ LASER Bilateral 02/2011   Both legs a few weeks apart  . LIVER BIOPSY     Current Medications: Current Meds  Medication Sig  . EPINEPHrine (EPIPEN 2-PAK) 0.3 mg/0.3 mL IJ SOAJ injection AUTO INJECTOR INTO OUTER THIGH PRN ANAPHYLAXIS INJECTION 30 DAYS  . fexofenadine (ALLEGRA) 180 MG tablet Take 1 tablet by mouth daily.  . fluticasone (FLONASE) 50 MCG/ACT nasal spray Place 1 spray into both nostrils daily.  . Multiple Vitamin (MULTIVITAMIN ADULT PO) Take by mouth.  . norethindrone (MICRONOR) 0.35 MG tablet Take 1 tablet (0.35 mg total) by mouth daily.  Marland Kitchen Propylene Glycol (SYSTANE BALANCE) 0.6 % SOLN Place 1 drop into both eyes daily as needed (dry eyes).  . sertraline (ZOLOFT) 50 MG tablet Take 75 mg by mouth daily. 1.5 pill a day  . verapamil (CALAN-SR) 240 MG CR tablet TAKE 1 TABLET DAILY     Allergies:   Latex, Azithromycin, Garlic, Onion, Other, and Levaquin [levofloxacin]   Social History   Socioeconomic History  . Marital status: Married    Spouse name: Not on file  . Number of children: Not on file  . Years of education: Not on file  . Highest education level: Not on file  Occupational History  . Not on file  Tobacco Use  . Smoking status: Never  Smoker  . Smokeless tobacco: Never Used  Vaping Use  . Vaping Use: Never used  Substance and Sexual Activity  . Alcohol use: Yes    Alcohol/week: 0.0 standard drinks    Comment: occasionally  . Drug use: No  . Sexual activity: Yes    Partners: Male    Comment: 1st intercourse-22, partners- 16, married- 37 yrs   Other Topics Concern  . Not on file  Social History Narrative  . Not on file   Social Determinants of Health   Financial Resource Strain:   . Difficulty of Paying Living Expenses: Not on file  Food Insecurity:   . Worried About Charity fundraiser in the Last  Year: Not on file  . Ran Out of Food in the Last Year: Not on file  Transportation Needs:   . Lack of Transportation (Medical): Not on file  . Lack of Transportation (Non-Medical): Not on file  Physical Activity:   . Days of Exercise per Week: Not on file  . Minutes of Exercise per Session: Not on file  Stress:   . Feeling of Stress : Not on file  Social Connections:   . Frequency of Communication with Friends and Family: Not on file  . Frequency of Social Gatherings with Friends and Family: Not on file  . Attends Religious Services: Not on file  . Active Member of Clubs or Organizations: Not on file  . Attends Archivist Meetings: Not on file  . Marital Status: Not on file     Family History: The patient's family history includes COPD in her mother; Hypertension in her mother; Other in her father. There is no history of Colon cancer or Esophageal cancer.  ROS:   Please see the history of present illness.    All other systems reviewed and are negative.  EKGs/Labs/Other Studies Reviewed:    The following studies were reviewed today:  EKG:  EKG is  ordered today.  The ekg ordered today demonstrates NSR, normal ECG, personally reviewed.  Recent Labs: 07/24/2020: ALT 25; BUN 11; Creatinine, Ser 0.75; Hemoglobin 13.7; Platelets 218; Potassium 4.6; Sodium 140  Recent Lipid Panel    Component Value Date/Time   CHOL 190 07/24/2020 0912   TRIG 132 07/24/2020 0912   HDL 76 07/24/2020 0912   CHOLHDL 2.5 07/24/2020 0912   CHOLHDL 3.8 10/29/2010 1019   VLDL 24 10/29/2010 1019   LDLCALC 91 07/24/2020 0912    Physical Exam:    VS:  BP 122/62   Pulse 89   Ht $R'5\' 5"'gK$  (1.651 m)   Wt 238 lb (108 kg)   SpO2 97%   BMI 39.61 kg/m     Wt Readings from Last 3 Encounters:  07/24/20 238 lb (108 kg)  06/21/20 233 lb (105.7 kg)  04/15/20 238 lb (108 kg)    GEN: Well nourished, well developed in no acute distress HEENT: Normal NECK: No JVD; No carotid bruits LYMPHATICS: No  lymphadenopathy CARDIAC: RRR, no murmurs, rubs, gallops RESPIRATORY:  Clear to auscultation without rales, wheezing or rhonchi  ABDOMEN: Soft, non-tender, non-distended MUSCULOSKELETAL:  No edema; No deformity  SKIN: Warm and dry NEUROLOGIC:  Alert and oriented x 3 PSYCHIATRIC:  Normal affect   ASSESSMENT:    1. DOE (dyspnea on exertion)   2. Vitamin D deficiency   3. Hyperlipidemia, unspecified hyperlipidemia type   4. Paroxysmal ventricular tachycardia (HCC)   5. V-tach ([place])    PLAN:    In order of problems listed  above:  1. Chest pain - previously normal cath in 2015, scheduled for CCTA in 2020 but she cancelled. 2. VT - now recurrent exertional palpitations - we will obtain exercise treadmill stress test and echocardiogram. 3. Hyperlipidemia - part of metabolic syndrome,  LDL particles 1400, LDL 105, normal HDL and TG, lifestyle modification, weight loss, dietary changes.  Obtain labs today - CBC, TSH, CMP, CRP, HbA1c, Vitamin D.  Medication Adjustments/Labs and Tests Ordered: Current medicines are reviewed at length with the patient today.  Concerns regarding medicines are outlined above.  Orders Placed This Encounter  Procedures  . CBC  . Comp Met (CMET)  . Lipid Profile  . HgB A1c  . C-reactive protein  . Vitamin D (25 hydroxy)  . Exercise Tolerance Test  . EKG 12-Lead  . ECHOCARDIOGRAM COMPLETE   No orders of the defined types were placed in this encounter.   Patient Instructions  Medication Instructions:   Your physician recommends that you continue on your current medications as directed. Please refer to the Current Medication list given to you today.  *If you need a refill on your cardiac medications before your next appointment, please call your pharmacy*  Lab Work:  TODAY--CMET, CBC, LIPIDS, CRP, HEMOGLOBIN A1C, VITAMIN D LEVEL   If you have labs (blood work) drawn today and your tests are completely normal, you will receive your results only  by: Marland Kitchen MyChart Message (if you have MyChart) OR . A paper copy in the mail If you have any lab test that is abnormal or we need to change your treatment, we will call you to review the results.  Testing/Procedures:  Your physician has requested that you have an echocardiogram. Echocardiography is a painless test that uses sound waves to create images of your heart. It provides your doctor with information about the size and shape of your heart and how well your heart's chambers and valves are working. This procedure takes approximately one hour. There are no restrictions for this procedure.  Your physician has requested that you have an exercise tolerance test. For further information please visit [email or web address]. Please also follow instruction sheet, as given.  Follow-Up:  2 MONTHS WITH AN EXTENDER IN THE OFFICE      Signed, Ena Dawley, MD  07/25/2020 11:02 PM    [place]

## 2020-07-25 LAB — HEMOGLOBIN A1C
Est. average glucose Bld gHb Est-mCnc: 117 mg/dL
Hgb A1c MFr Bld: 5.7 % — ABNORMAL HIGH (ref 4.8–5.6)

## 2020-07-25 LAB — COMPREHENSIVE METABOLIC PANEL
ALT: 25 IU/L (ref 0–32)
AST: 20 IU/L (ref 0–40)
Albumin/Globulin Ratio: 1.3 (ref 1.2–2.2)
Albumin: 4.4 g/dL (ref 3.8–4.9)
Alkaline Phosphatase: 89 IU/L (ref 44–121)
BUN/Creatinine Ratio: 15 (ref 9–23)
BUN: 11 mg/dL (ref 6–24)
Bilirubin Total: 0.2 mg/dL (ref 0.0–1.2)
CO2: 23 mmol/L (ref 20–29)
Calcium: 9.7 mg/dL (ref 8.7–10.2)
Chloride: 103 mmol/L (ref 96–106)
Creatinine, Ser: 0.75 mg/dL (ref 0.57–1.00)
GFR calc Af Amer: 106 mL/min/{1.73_m2} (ref >59)
GFR calc non Af Amer: 92 mL/min/{1.73_m2} (ref >59)
Globulin, Total: 3.4 g/dL (ref 1.5–4.5)
Glucose: 104 mg/dL — ABNORMAL HIGH (ref 65–99)
Potassium: 4.6 mmol/L (ref 3.5–5.2)
Sodium: 140 mmol/L (ref 134–144)
Total Protein: 7.8 g/dL (ref 6.0–8.5)

## 2020-07-25 LAB — CBC
Hematocrit: 41.3 % (ref 34.0–46.6)
Hemoglobin: 13.7 g/dL (ref 11.1–15.9)
MCH: 27.2 pg (ref 26.6–33.0)
MCHC: 33.2 g/dL (ref 31.5–35.7)
MCV: 82 fL (ref 79–97)
Platelets: 218 10*3/uL (ref 150–450)
RBC: 5.03 x10E6/uL (ref 3.77–5.28)
RDW: 14.6 % (ref 11.7–15.4)
WBC: 5.2 10*3/uL (ref 3.4–10.8)

## 2020-07-25 LAB — LIPID PANEL
Chol/HDL Ratio: 2.5 ratio (ref 0.0–4.4)
Cholesterol, Total: 190 mg/dL (ref 100–199)
HDL: 76 mg/dL (ref >39)
LDL Chol Calc (NIH): 91 mg/dL (ref 0–99)
Triglycerides: 132 mg/dL (ref 0–149)
VLDL Cholesterol Cal: 23 mg/dL (ref 5–40)

## 2020-07-25 LAB — VITAMIN D 25 HYDROXY (VIT D DEFICIENCY, FRACTURES): Vit D, 25-Hydroxy: 58.8 ng/mL (ref 30.0–100.0)

## 2020-07-25 LAB — C-REACTIVE PROTEIN: CRP: 3 mg/L (ref 0–10)

## 2020-08-07 ENCOUNTER — Telehealth (HOSPITAL_COMMUNITY): Payer: Self-pay | Admitting: Cardiology

## 2020-08-07 NOTE — Telephone Encounter (Signed)
Patient called and cancelled echocardiogram and GXT with Selena due to increase of COVID.  I called patient to see if she would like to reschedule and patient did not want to reschedule appointments. Orders will be removed from the WQ and if patient calls back to reschedule we can reinstate the orders. Thank you.

## 2020-08-07 NOTE — Telephone Encounter (Signed)
Message already sent to Dr. Delton See as an Lorain Childes, about pt cancelling GXT and not wanting to reschedule at this time.

## 2020-08-09 ENCOUNTER — Other Ambulatory Visit (HOSPITAL_COMMUNITY): Payer: BC Managed Care – PPO

## 2020-08-13 ENCOUNTER — Other Ambulatory Visit (HOSPITAL_COMMUNITY): Payer: BC Managed Care – PPO

## 2020-09-03 ENCOUNTER — Other Ambulatory Visit: Payer: Self-pay

## 2020-09-03 MED ORDER — VERAPAMIL HCL ER 240 MG PO TBCR
240.0000 mg | EXTENDED_RELEASE_TABLET | Freq: Every day | ORAL | 3 refills | Status: DC
Start: 2020-09-03 — End: 2021-01-13

## 2020-09-03 NOTE — Telephone Encounter (Signed)
Pt's medication was sent to pt's pharmacy as requested. Confirmation received.  °

## 2020-09-24 ENCOUNTER — Ambulatory Visit: Payer: BC Managed Care – PPO | Admitting: Physician Assistant

## 2021-01-06 ENCOUNTER — Ambulatory Visit (HOSPITAL_COMMUNITY): Payer: BC Managed Care – PPO | Attending: Cardiovascular Disease

## 2021-01-06 ENCOUNTER — Other Ambulatory Visit: Payer: Self-pay

## 2021-01-06 DIAGNOSIS — R0600 Dyspnea, unspecified: Secondary | ICD-10-CM | POA: Insufficient documentation

## 2021-01-06 DIAGNOSIS — R0609 Other forms of dyspnea: Secondary | ICD-10-CM

## 2021-01-06 LAB — ECHOCARDIOGRAM COMPLETE
Area-P 1/2: 3.68 cm2
S' Lateral: 2.8 cm

## 2021-01-11 NOTE — Progress Notes (Signed)
Cardiology Office Note:    Date:  01/13/2021   ID:  Ruth Matthews, [place] 1966/04/02, MRN [id number]  PCP:  Macy Mis, MD   [facility]  Cardiologist:  No primary care provider on file.  Advanced Practice Provider:  No care team member to display Electrophysiologist:  None    Referring MD: Macy Mis, MD    History of Present Illness:    Ruth Matthews is a 55 y.o. female with a hx of DVT, HTN, fascicular VT and HLD who was previously followed by Dr. Delton See who now returns to clinic for follow-up.  She was previously seen by Dr Anne Fu for palpitations and exertional presyncope at a gym in 2012. She was diagnosed with a wide complex tachycardia negatively deflecting QRS complexes in the lateral leads as well as right axis deviation and negative deflection in II, III and aVF. She was given an IV and amiodarone and converted after about 6 minutes. Briefly she was in atrial fibrillation postconversion and then remained in sinus rhythm for the hospitalization. Dr. Graciela Husbands recommended verapamil for treatment of this left fascicular VT. Cardiac MRI was unremarkable. Echocardiogram unremarkable. Cardiac catheterization unremarkable, no CAD.  Last saw Dr. Delton See on 07/24/2020 where she was feeling more fatigue and SOB with exertion. Also was having recurrent palpitations with exertion. TTE obtained on 01/06/21 showed normal BiV function and no significant valve disease. She was also recommended for coronary CTA but she cancelled as well as an exercise tolerance test which was not completed. She now returns to clinic for follow-up.  Today, the patient states that she has been doing so much better. Fatigue is improved. Has been exercising daily on the elliptical. Lost 42 lbs last year. Has a lot of anxiety and has occasional episodes of heart racing especially after a hike. No chest pain currently. Has occasional swelling which she is trying to manage with her  diet.   Past Medical History:  Diagnosis Date  . Anxiety   . DVT (deep venous thrombosis) (HCC)   . Elevated fasting blood sugar   . Gallstone   . Gestational diabetes 2000  . Hypertension   . 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 Anne Fu, Dr. Graciela Husbands  . Obesity   . Ovarian cyst    ruptured  . RMSF [facility] spotted fever) 2004   history of   . Varicose veins   . Ventricular tachycardia (H[facility])   . Vitamin D deficiency     Past Surgical History:  Procedure Laterality Date  . CARDIAC CATHETERIZATION     no CAD  . CESAREAN SECTION    . CHOLECYSTECTOMY    . ENDOVENOUS ABLATION SAPHENOUS VEIN W/ LASER Bilateral 02/2011   Both legs a few weeks apart  . LIVER BIOPSY      Current Medications: Current Meds  Medication Sig  . EPINEPHrine 0.3 mg/0.3 mL IJ SOAJ injection AUTO INJECTOR INTO OUTER THIGH PRN ANAPHYLAXIS INJECTION 30 DAYS  . fexofenadine (ALLEGRA) 180 MG tablet Take 4 tablets by mouth daily.  . fluticasone (FLONASE) 50 MCG/ACT nasal spray Place 1 spray into both nostrils daily.  . Multiple Vitamin (MULTIVITAMIN ADULT PO) Take by mouth.  Marland Kitchen omeprazole (PRILOSEC) 20 MG capsule Take 20 mg by mouth daily.  Marland Kitchen Propylene Glycol (SYSTANE BALANCE) 0.6 % SOLN Place 1 drop into both eyes daily as needed (dry eyes).  . sertraline (ZOLOFT) 50 MG tablet Take 75 mg by mouth daily. 1.5 pill a  day  . [DISCONTINUED] norethindrone (MICRONOR) 0.35 MG tablet Take 1 tablet (0.35 mg total) by mouth daily.  . [DISCONTINUED] verapamil (CALAN-SR) 240 MG CR tablet Take 1 tablet (240 mg total) by mouth daily.     Allergies:   Latex, Azithromycin, Garlic, Onion, Other, and Levaquin [levofloxacin]   Social History   Socioeconomic History  . Marital status: Married    Spouse name: Not on file  . Number of children: Not on file  . Years of education: Not on file  . Highest education level: Not on file  Occupational History  . Not on file  Tobacco Use  . Smoking status:  Never Smoker  . Smokeless tobacco: Never Used  Vaping Use  . Vaping Use: Never used  Substance and Sexual Activity  . Alcohol use: Yes    Alcohol/week: 0.0 standard drinks    Comment: occasionally  . Drug use: No  . Sexual activity: Yes    Partners: Male    Comment: 1st intercourse-22, partners- 3, married- 30 yrs   Other Topics Concern  . Not on file  Social History Narrative  . Not on file   Social Determinants of Health   Financial Resource Strain: Not on file  Food Insecurity: Not on file  Transportation Needs: Not on file  Physical Activity: Not on file  Stress: Not on file  Social Connections: Not on file     Family History: The patient's family history includes COPD in her mother; Hypertension in her mother; Other in her father. There is no history of Colon cancer or Esophageal cancer.  ROS:   Please see the history of present illness.    Review of Systems  Constitutional: Positive for weight loss. Negative for chills, diaphoresis and fever.  HENT: Negative for hearing loss.   Eyes: Negative for blurred vision and redness.  Respiratory: Negative for shortness of breath.   Cardiovascular: Positive for palpitations. Negative for chest pain, orthopnea, claudication, leg swelling and PND.  Gastrointestinal: Negative for melena, nausea and vomiting.  Genitourinary: Negative for dysuria and flank pain.  Musculoskeletal: Negative for falls and myalgias.  Neurological: Negative for dizziness and loss of consciousness.  Endo/Heme/Allergies: Negative for polydipsia.  Psychiatric/Behavioral: Negative for substance abuse.    EKGs/Labs/Other Studies Reviewed:    The following studies were reviewed today: TTE 2021-01-26: IMPRESSIONS  1. Left ventricular ejection fraction, by estimation, is 60 to 65%. Left  ventricular ejection fraction by 3D volume is 60 %. The left ventricle has  normal function. The left ventricle has no regional wall motion  abnormalities. Left  ventricular diastolic  parameters were normal.  2. Right ventricular systolic function is normal. The right ventricular  size is normal. There is normal pulmonary artery systolic pressure.  3. The mitral valve is normal in structure. No evidence of mitral valve  regurgitation. No evidence of mitral stenosis.  4. The aortic valve is normal in structure. Aortic valve regurgitation is  not visualized. No aortic stenosis is present.  5. The inferior vena cava is normal in size with greater than 50%  respiratory variability, suggesting right atrial pressure of 3 mmHg.   Cardiac MR: Findings: All 4 cardiac chambers were normal in size and function.  There was no ASD, VSD or pericardial effusion. Mitral, aortic and  tricuspid valves appeared morphologically normal. Quantitative EF  was 61% ( EDV-119cc, ESV-47cc, SV 72cc) Hyperenhancement images  showed no infiltration or scar tissue in the LV    Impression:     1)  Normal cardiac MRI      2)  EF 61% with no RWMA's  3)  No infiltration or hyperenhancement in LV    Recent Labs: 07/24/2020: ALT 25; BUN 11; Creatinine, Ser 0.75; Hemoglobin 13.7; Platelets 218; Potassium 4.6; Sodium 140  Recent Lipid Panel    Component Value Date/Time   CHOL 190 07/24/2020 0912   TRIG 132 07/24/2020 0912   HDL 76 07/24/2020 0912   CHOLHDL 2.5 07/24/2020 0912   CHOLHDL 3.8 10/29/2010 1019   VLDL 24 10/29/2010 1019   LDLCALC 91 07/24/2020 0912      Physical Exam:    VS:  BP 124/78   Pulse 85   Ht 5\' 5"  (1.651 m)   Wt 226 lb (102.5 kg)   SpO2 99%   BMI 37.61 kg/m     Wt Readings from Last 3 Encounters:  01/13/21 226 lb (102.5 kg)  07/24/20 238 lb (108 kg)  06/21/20 233 lb (105.7 kg)     GEN:  Well nourished, well developed in no acute distress HEENT: Normal NECK: No JVD; No carotid bruits CARDIAC: RRR, no murmurs, rubs, gallops RESPIRATORY:  Clear to auscultation without rales, wheezing or rhonchi  ABDOMEN: Soft,  non-tender, non-distended MUSCULOSKELETAL:  No edema; No deformity  SKIN: Warm and dry NEUROLOGIC:  Alert and oriented x 3 PSYCHIATRIC:  Normal affect   ASSESSMENT:    1. Paroxysmal ventricular tachycardia (HCC)   2. DOE (dyspnea on exertion)   3. Hyperlipidemia, unspecified hyperlipidemia type   4. Obesity (BMI 35.0-39.9 without comorbidity)    PLAN:    In order of problems listed above:  #Fascicular VT: Diagnosed in 2012. Cardiac MRI normal and cath without obstructive disease. Palpitations overall improved. Has occasional episodes but not sustaining. Exercising daily without issues. No chest pain or SOB. TTE with normal EF, no significant valve disease.  -Continue verapamil 240mg  daily -If recurrence of symptoms, will proceed with exercise stress test  #DOE:  Significantly improved. TTE with normal BiV function. No anginal or HF symptoms.  -Continue to monitor  #HLD: LDL 91.  -Continue dietary modifications; lost 45lbs -Will montior  #Obesity: Patient actively trying to lose weight and already lost 45lbs. Discussed diet and lifestyle modifications extensively today. -Continue diet and lifestyle modifications -May do trial of intermittent fasting with Mediterranean diet -Continue exercise daily   Exercise recommendations: Goal of exercising for at least 30 minutes a day, at least 5 times per week.  Please exercise to a moderate exertion.  This means that while exercising it is difficult to speak in full sentences, however you are not so short of breath that you feel you must stop, and not so comfortable that you can carry on a full conversation.  Exertion level should be approximately a 5/10, if 10 is the most exertion you can perform.  Diet recommendations: Recommend a heart healthy diet such as the Mediterranean diet.  This diet consists of plant based foods, healthy fats, lean meats, olive oil.  It suggests limiting the intake of simple carbohydrates such as white  breads, pastries, and pastas.  It also limits the amount of red meat, wine, and dairy products such as cheese that one should consume on a daily basis.    Medication Adjustments/Labs and Tests Ordered: Current medicines are reviewed at length with the patient today.  Concerns regarding medicines are outlined above.  No orders of the defined types were placed in this encounter.  Meds ordered this encounter  Medications  . verapamil (CALAN-SR)  240 MG CR tablet    Sig: Take 1 tablet (240 mg total) by mouth daily.    Dispense:  90 tablet    Refill:  2    Patient Instructions  Medication Instructions:   Your physician recommends that you continue on your current medications as directed. Please refer to the Current Medication list given to you today.  *If you need a refill on your cardiac medications before your next appointment, please call your pharmacy*   Follow-Up: At [facility], you and your health needs are our priority.  As part of our continuing mission to provide you with exceptional heart care, we have created designated Provider Care Teams.  These Care Teams include your primary Cardiologist (physician) and Advanced Practice Providers (APPs -  Physician Assistants and Nurse Practitioners) who all work together to provide you with the care you need, when you need it.  We recommend signing up for the patient portal called "MyChart".  Sign up information is provided on this After Visit Summary.  MyChart is used to connect with patients for Virtual Visits (Telemedicine).  Patients are able to view lab/test results, encounter notes, upcoming appointments, etc.  Non-urgent messages can be sent to your provider as well.   To learn more about what you can do with MyChart, go to [email or web address].    Your next appointment:   6 month(s)  The format for your next appointment:   In Person  Provider:   You will see one of the following Advanced Practice Providers on your  designated Care Team:    Tereso NewcomerScott Weaver, PA-C  Chelsea AusVin Bhagat, [place] JerseyPA-C         Signed, Meriam SpragueHeather E Valincia Touch, MD  01/13/2021 5:16 PM    [facility]

## 2021-01-13 ENCOUNTER — Ambulatory Visit: Payer: BC Managed Care – PPO | Admitting: Cardiology

## 2021-01-13 ENCOUNTER — Encounter: Payer: Self-pay | Admitting: Cardiology

## 2021-01-13 ENCOUNTER — Other Ambulatory Visit: Payer: Self-pay

## 2021-01-13 VITALS — BP 124/78 | HR 85 | Ht 65.0 in | Wt 226.0 lb

## 2021-01-13 DIAGNOSIS — E785 Hyperlipidemia, unspecified: Secondary | ICD-10-CM | POA: Diagnosis not present

## 2021-01-13 DIAGNOSIS — E669 Obesity, unspecified: Secondary | ICD-10-CM

## 2021-01-13 DIAGNOSIS — I4729 Other ventricular tachycardia: Secondary | ICD-10-CM

## 2021-01-13 DIAGNOSIS — R0600 Dyspnea, unspecified: Secondary | ICD-10-CM

## 2021-01-13 DIAGNOSIS — R0609 Other forms of dyspnea: Secondary | ICD-10-CM

## 2021-01-13 DIAGNOSIS — I472 Ventricular tachycardia, unspecified: Secondary | ICD-10-CM

## 2021-01-13 MED ORDER — VERAPAMIL HCL ER 240 MG PO TBCR
240.0000 mg | EXTENDED_RELEASE_TABLET | Freq: Every day | ORAL | 2 refills | Status: DC
Start: 2021-01-13 — End: 2021-11-11

## 2021-01-13 NOTE — Patient Instructions (Signed)
Medication Instructions:   Your physician recommends that you continue on your current medications as directed. Please refer to the Current Medication list given to you today.  *If you need a refill on your cardiac medications before your next appointment, please call your pharmacy*    Follow-Up: At [facility], you and your health needs are our priority.  As part of our continuing mission to provide you with exceptional heart care, we have created designated Provider Care Teams.  These Care Teams include your primary Cardiologist (physician) and Advanced Practice Providers (APPs -  Physician Assistants and Nurse Practitioners) who all work together to provide you with the care you need, when you need it.  We recommend signing up for the patient portal called "MyChart".  Sign up information is provided on this After Visit Summary.  MyChart is used to connect with patients for Virtual Visits (Telemedicine).  Patients are able to view lab/test results, encounter notes, upcoming appointments, etc.  Non-urgent messages can be sent to your provider as well.   To learn more about what you can do with MyChart, go to [email or web address].    Your next appointment:   6 month(s)  The format for your next appointment:   In Person  Provider:   You will see one of the following Advanced Practice Providers on your designated Care Team:    Scott Weaver, PA-C  Vin Bhagat, PA-C    

## 2021-01-31 ENCOUNTER — Emergency Department (HOSPITAL_COMMUNITY): Payer: BC Managed Care – PPO

## 2021-01-31 ENCOUNTER — Encounter (HOSPITAL_COMMUNITY): Payer: Self-pay | Admitting: Pharmacy Technician

## 2021-01-31 ENCOUNTER — Other Ambulatory Visit: Payer: Self-pay

## 2021-01-31 ENCOUNTER — Emergency Department (HOSPITAL_COMMUNITY)
Admission: EM | Admit: 2021-01-31 | Discharge: 2021-01-31 | Disposition: A | Discharge status: Home / Self Care | Payer: BC Managed Care – PPO | Attending: Emergency Medicine | Admitting: Emergency Medicine

## 2021-01-31 DIAGNOSIS — X501XXA Overexertion from prolonged static or awkward postures, initial encounter: Secondary | ICD-10-CM | POA: Insufficient documentation

## 2021-01-31 DIAGNOSIS — Z91040 Latex allergy status: Secondary | ICD-10-CM | POA: Diagnosis not present

## 2021-01-31 DIAGNOSIS — R52 Pain, unspecified: Secondary | ICD-10-CM

## 2021-01-31 DIAGNOSIS — M25551 Pain in right hip: Secondary | ICD-10-CM | POA: Diagnosis not present

## 2021-01-31 DIAGNOSIS — Z79899 Other long term (current) drug therapy: Secondary | ICD-10-CM | POA: Diagnosis not present

## 2021-01-31 DIAGNOSIS — I10 Essential (primary) hypertension: Secondary | ICD-10-CM | POA: Diagnosis not present

## 2021-01-31 MED ORDER — IBUPROFEN 400 MG PO TABS
600.0000 mg | ORAL_TABLET | Freq: Once | ORAL | Status: AC
  Administered 2021-01-31: 600 mg via ORAL
  Filled 2021-01-31: qty 1

## 2021-01-31 NOTE — ED Provider Notes (Signed)
[facility] EMERGENCY DEPARTMENT Provider Note   CSN: [id number] Arrival date & time: 01/31/21  1328     History No chief complaint on file.   Ruth Matthews is a 55 y.o. female.   Hip Pain This is a new problem. The current episode started 3 to 5 hours ago. The problem occurs constantly. The problem has not changed since onset.Pertinent negatives include no chest pain, no abdominal pain, no headaches and no shortness of breath. Exacerbated by: bearing weight. The symptoms are relieved by rest. The treatment provided moderate relief.       Past Medical History:  Diagnosis Date  . Anxiety   . DVT (deep venous thrombosis) (HCC)   . Elevated fasting blood sugar   . Gallstone   . Gestational diabetes 2000  . Hypertension   . 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 Anne Fu, Dr. Graciela Husbands  . Obesity   . Ovarian cyst    ruptured  . RMSF [facility] spotted fever) 2004   history of   . Varicose veins   . Ventricular tachycardia (HCC)   . Vitamin D deficiency     Patient Active Problem List   Diagnosis Date Noted  . Laryngopharyngeal reflux (LPR) 06/11/2020  . 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 Tinnitus, left 06/11/2020  . Tonsillar calculus 06/11/2020  . Gastroesophageal reflux disease 02/23/2020  . NAFLD (nonalcoholic fatty liver disease) 92/42/6834  . Arthralgia 04/29/2018  . Migraines 08/06/2017  . Superficial thrombophlebitis 04/03/2017  . Anxiety and depression 10/27/2016  . Insomnia 07/17/2016  . History of DVT (deep vein thrombosis) 01/20/2016  . Allergic rhinitis 12/25/2015  . Obesity 12/25/2015  . Sinus pressure 12/25/2015  . Tachycardia 12/25/2015  . Varicose veins of both legs with edema 12/25/2015  . Vitamin D deficiency 12/25/2015  . DVT (deep venous thrombosis) (HCC) 04/23/2015  . Paroxysmal ventricular tachycardia (HCC) 12/11/2013  . Essential  hypertension, benign 12/11/2013  . Other and unspecified hyperlipidemia 12/11/2013  . Edema 12/11/2013  . Other malaise and fatigue 12/11/2013    Past Surgical History:  Procedure Laterality Date  . CARDIAC CATHETERIZATION     no CAD  . CESAREAN SECTION    . CHOLECYSTECTOMY    . ENDOVENOUS ABLATION SAPHENOUS VEIN W/ LASER Bilateral 02/2011   Both legs a few weeks apart  . LIVER BIOPSY       OB History    Gravida  2   Para  2   Term      Preterm      AB      Living  2     SAB      IAB      Ectopic      Multiple      Live Births              Family History  Problem Relation Age of Onset  . Hypertension Mother   . COPD Mother   . Other Father        died from accident, h/o lyme disease  . Colon cancer Neg Hx   . Esophageal cancer Neg Hx     Social History   Tobacco Use  . Smoking status: Never Smoker  . Smokeless tobacco: Never Used  Vaping Use  . Vaping Use: Never used  Substance Use Topics  . Alcohol use: Yes    Alcohol/week: 0.0 standard drinks    Comment: occasionally  .  Drug use: No    Home Medications Prior to Admission medications   Medication Sig Start Date End Date Taking? Authorizing Provider  EPINEPHrine 0.3 mg/0.3 mL IJ SOAJ injection AUTO INJECTOR INTO OUTER THIGH PRN ANAPHYLAXIS INJECTION 30 DAYS 01/11/20   [provider]  fexofenadine (ALLEGRA) 180 MG tablet Take 4 tablets by mouth daily.    [provider]  fluticasone (FLONASE) 50 MCG/ACT nasal spray Place 1 spray into both nostrils daily.    [provider]  Multiple Vitamin (MULTIVITAMIN ADULT PO) Take by mouth.    [provider]  omeprazole (PRILOSEC) 20 MG capsule Take 20 mg by mouth daily.    [provider]  Propylene Glycol (SYSTANE BALANCE) 0.6 % SOLN Place 1 drop into both eyes daily as needed (dry eyes).    [provider]  sertraline (ZOLOFT) 50 MG tablet Take 75 mg by mouth daily. 1.5 pill a day    [provider]  verapamil (CALAN-SR) 240 MG CR tablet Take 1 tablet (240 mg total) by mouth daily. 01/13/21   Meriam Sprague, MD    Allergies    Latex, Azithromycin, Garlic, Onion, Other, and Levaquin [levofloxacin]  Review of Systems   Review of Systems  Constitutional: Negative for chills and fever.  HENT: Negative for congestion and rhinorrhea.   Respiratory: Negative for cough and shortness of breath.   Cardiovascular: Negative for chest pain and palpitations.  Gastrointestinal: Negative for abdominal pain, diarrhea, nausea and vomiting.  Genitourinary: Negative for difficulty urinating and dysuria.  Musculoskeletal: Positive for arthralgias and gait problem. Negative for back pain.  Skin: Negative for rash and wound.  Neurological: Negative for light-headedness and headaches.    Physical Exam Updated Vital Signs BP 130/70   Pulse (!) 57   Temp 97.8 F (36.6 C) (Oral)   Resp 16   SpO2 100%   Physical Exam Vitals and nursing note reviewed. Exam conducted with a chaperone present.  Constitutional:      General: She is not in acute distress.    Appearance: Normal appearance.  HENT:     Head: Normocephalic and atraumatic.     Nose: No rhinorrhea.  Eyes:     General:        Right eye: No discharge.        Left eye: No discharge.     Conjunctiva/sclera: Conjunctivae normal.  Cardiovascular:     Rate and Rhythm: Normal rate and regular rhythm.  Pulmonary:     Effort: Pulmonary effort is normal. No respiratory distress.     Breath sounds: No stridor.  Abdominal:     General: Abdomen is flat. There is no distension.     Palpations: Abdomen is soft.  Musculoskeletal:        General: No swelling, tenderness, deformity or signs of injury.     Comments: Right hip pain at the greater trochanter and pubic rami area on palpation.  No deformity no crepitus.  Neurovascular intact distal.  Difficulty with bearing weight due to pain but able to stand independently.  Able to  hobble to the bathroom.  Skin:    General: Skin is warm and dry.  Neurological:     General: No focal deficit present.     Mental Status: She is alert. Mental status is at baseline.     Motor: No weakness.  Psychiatric:        Mood and Affect: Mood normal.        Behavior: Behavior normal.  ED Results / Procedures / Treatments   Labs (all labs ordered are listed, but only abnormal results are displayed) Labs Reviewed - No data to display  EKG None  Radiology CT Hip Right Wo Contrast  Result Date: 01/31/2021 CLINICAL DATA:  Right hip pain, injury, inability to bear weight EXAM: CT OF THE RIGHT HIP WITHOUT CONTRAST TECHNIQUE: Multidetector CT imaging of the right hip was performed according to the standard protocol. Multiplanar CT image reconstructions were also generated. COMPARISON:  01/31/2021 FINDINGS: Bones/Joint/Cartilage No acute displaced fracture. The right hip is well aligned, with mild joint space narrowing and osteophyte formation. No joint effusion. Ligaments Suboptimally assessed by CT. Muscles and Tendons No gross abnormalities. Soft tissues Visualized portions of the bowel are unremarkable. Bladder is grossly normal. Uterus is age-appropriate. No adnexal masses. Remaining soft tissues are unremarkable. Reconstructed images demonstrate no additional findings. IMPRESSION: 1. Mild right hip osteoarthritis.  No acute displaced fracture. Electronically Signed   By: Sharlet Salina M.D.   On: 01/31/2021 20:45   DG HIP UNILAT WITH PELVIS 2-3 VIEWS RIGHT  Result Date: 01/31/2021 CLINICAL DATA:  Lateral hip pain EXAM: DG HIP (WITH OR WITHOUT PELVIS) 2-3V RIGHT COMPARISON:  None. FINDINGS: No acute fracture. No malalignment. Hip joint space appears preserved. Acetabular osteophytosis. No bone lesion identified. Soft tissues appear within normal limits. IMPRESSION: Mild degenerative changes of the right hip.  No acute findings. Electronically Signed   By: Duanne Guess D.O.   On:  01/31/2021 15:02    Procedures Procedures   Medications Ordered in ED Medications  ibuprofen (ADVIL) tablet 600 mg (600 mg Oral Given 01/31/21 1654)    ED Course  I have reviewed the triage vital signs and the nursing notes.  Pertinent labs & imaging results that were available during my care of the patient were reviewed by me and considered in my medical decision making (see chart for details).    MDM Rules/Calculators/A&P                          Patient was stretching and felt a pop in the hip.  Has had trouble bearing weight since.  X-ray read and reviewed by radiology myself shows no acute fracture or malalignment but chronic degenerative changes.  Will get CT scan.  If negative outpatient follow-up if positive will address.  Pain control provided no other injury found or reported.  CT shows degenerative changes no acute fracture.  Patient be given crutches outpatient follow-up recommended.  Likely muscle or ligamentous strain.  Return precautions discussed  Final Clinical Impression(s) / ED Diagnoses Final diagnoses:  Right hip pain    Rx / DC Orders ED Discharge Orders    None       Sabino Donovan, MD 01/31/21 2110

## 2021-01-31 NOTE — ED Triage Notes (Signed)
Pt here with reports of hip pain R sided. States pain started while stretching. Now unable to bear weight.

## 2021-01-31 NOTE — ED Notes (Signed)
All appropriate discharge materials reviewed at length with patient. Time for questions provided. Pt has no other questions at this time and verbalizes understanding of all provided materials.  

## 2021-01-31 NOTE — Discharge Instructions (Addendum)
You can take 600 mg of ibuprofen every 6 hours, you can take 1000 mg of Tylenol every 6 hours, you can alternate these every 3 or you can take them together.  Use the cruthes to bear weight as tolerated.  Call the orthopedist on Monday to schedule an appointment for further management of your hip pain.  Return to [name] with any new concerning findings

## 2021-01-31 NOTE — ED Provider Notes (Signed)
Patient placed in Quick Look pathway, seen and evaluated   Chief Complaint: hip/groin pain  HPI:   Pt exercising and had a pop in her hip  ROS: no back pain   Physical Exam:   Gen: No distress  Neuro: Awake and Alert  Skin: Warm    Focused Exam: tender right hip and right groin  Pain with movement    Initiation of care has begun. The patient has been counseled on the process, plan, and necessity for staying for the completion/evaluation, and the remainder of the medical screening examination   Osie Cheeks 01/31/21 1405    Koleen Distance, MD 01/31/21 [phone]

## 2021-02-03 ENCOUNTER — Other Ambulatory Visit: Payer: Self-pay | Admitting: Gastroenterology

## 2021-02-06 ENCOUNTER — Ambulatory Visit: Payer: BC Managed Care – PPO | Admitting: Orthopaedic Surgery

## 2021-02-06 ENCOUNTER — Encounter: Payer: Self-pay | Admitting: Orthopaedic Surgery

## 2021-02-06 DIAGNOSIS — M25551 Pain in right hip: Secondary | ICD-10-CM

## 2021-02-06 NOTE — Progress Notes (Signed)
Office Visit Note   Patient: Ruth Matthews           Date of Birth: Oct 22, 1965           MRN: [id number] Visit Date: 02/06/2021              Requested by: Macy Mis, MD [address] [place],  [place] [address] PCP: Macy Mis, MD   Assessment & Plan: Visit Diagnoses:  1. Pain in right hip     Plan: Based on clinical and radiographic findings I feel that she may have snapped her IT band over the greater trochanter and has now caused irritation and inflammation.  She has really good strength to her abductors and her presentation would be atypical for intra-articular pathology although it cannot be excluded.  Based on our conversation I think the best thing to do is to continue to take it easy for the next couple weeks as overall it has improved.  She can wean crutches as tolerated.  If in a couple weeks she feels better she may benefit from a short course of outpatient physical therapy.  If she does not improve in a couple weeks she has been instructed to follow-up with [name] for further evaluation.  She has my card.  Follow-Up Instructions: Return if symptoms worsen or fail to improve.   Orders:  Orders Placed This Encounter  Procedures  . Ambulatory referral to Physical Therapy   No orders of the defined types were placed in this encounter.     Procedures: No procedures performed   Clinical Data: No additional findings.   Subjective: Chief Complaint  Patient presents with  . Right Hip - Pain    HPI patient is a very pleasant 55 year old female who comes in today with concerns about her right hip.  About 6 days ago, she was standing on her right leg awkwardly bending over to grab her left ankle for a quad stretch when she felt a painful audible pop to the right hip.  She fell to the ground and was unable to bear weight due to the pain.  She was seen in the ED where x-rays and subsequent CT scan of the right hip were obtained.  These were  negative for acute fracture or other findings.  Her pain has slightly improved since.  She is now able to bear weight on the right leg.  She still notes pain to the groin, lateral hip and buttock.  She describes this as an ache worse with any side to side motions.  She has been taking ibuprofen for which she stopped due to GI upset.  She denies any bruising.  She initially noted some tingling into her right foot but this has dissipated.  Review of Systems as detailed in HPI.  All others reviewed and are negative.   Objective: Vital Signs: There were no vitals taken for this visit.  Physical Exam well-developed and well-nourished female in no acute distress.  Alert oriented x3.  Ortho Exam right hip exam shows a negative logroll and negative FADIR.  She has slightly increased pain with resisted abduction and abduction as well as flexion of the knee.  She has full strength throughout to include abduction and adduction against gravity.  She is neurovascular intact distally.  Specialty Comments:  No specialty comments available.  Imaging: No new imaging   PMFS History: Patient Active Problem List   Diagnosis Date Noted  . Laryngopharyngeal reflux (LPR) 06/11/2020  .  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 Tinnitus, left 06/11/2020  . Tonsillar calculus 06/11/2020  . Gastroesophageal reflux disease 02/23/2020  . NAFLD (nonalcoholic fatty liver disease) 85/63/1497  . Arthralgia 04/29/2018  . Migraines 08/06/2017  . Superficial thrombophlebitis 04/03/2017  . Anxiety and depression 10/27/2016  . Insomnia 07/17/2016  . History of DVT (deep vein thrombosis) 01/20/2016  . Allergic rhinitis 12/25/2015  . Obesity 12/25/2015  . Sinus pressure 12/25/2015  . Tachycardia 12/25/2015  . Varicose veins of both legs with edema 12/25/2015  . Vitamin D deficiency 12/25/2015  . DVT (deep venous thrombosis) (HCC) 04/23/2015  . Paroxysmal  ventricular tachycardia (HCC) 12/11/2013  . Essential hypertension, benign 12/11/2013  . Other and unspecified hyperlipidemia 12/11/2013  . Edema 12/11/2013  . Other malaise and fatigue 12/11/2013   Past Medical History:  Diagnosis Date  . Anxiety   . DVT (deep venous thrombosis) (HCC)   . Elevated fasting blood sugar   . Gallstone   . Gestational diabetes 2000  . Hypertension   . 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 Anne Fu, Dr. Graciela Husbands  . Obesity   . Ovarian cyst    ruptured  . RMSF [facility] spotted fever) 2004   history of   . Varicose veins   . Ventricular tachycardia (HCC)   . Vitamin D deficiency     Family History  Problem Relation Age of Onset  . Hypertension Mother   . COPD Mother   . Other Father        died from accident, h/o lyme disease  . Colon cancer Neg Hx   . Esophageal cancer Neg Hx     Past Surgical History:  Procedure Laterality Date  . CARDIAC CATHETERIZATION     no CAD  . CESAREAN SECTION    . CHOLECYSTECTOMY    . ENDOVENOUS ABLATION SAPHENOUS VEIN W/ LASER Bilateral 02/2011   Both legs a few weeks apart  . LIVER BIOPSY     Social History   Occupational History  . Not on file  Tobacco Use  . Smoking status: Never Smoker  . Smokeless tobacco: Never Used  Vaping Use  . Vaping Use: Never used  Substance and Sexual Activity  . Alcohol use: Yes    Alcohol/week: 0.0 standard drinks    Comment: occasionally  . Drug use: No  . Sexual activity: Yes    Partners: Male    Comment: 1st intercourse-22, partners- 3, married- 30 yrs

## 2021-02-17 ENCOUNTER — Ambulatory Visit: Payer: BC Managed Care – PPO | Admitting: Physical Therapy

## 2021-02-19 ENCOUNTER — Encounter: Payer: Self-pay | Admitting: Physical Therapy

## 2021-02-19 ENCOUNTER — Other Ambulatory Visit: Payer: Self-pay

## 2021-02-19 ENCOUNTER — Ambulatory Visit: Payer: BC Managed Care – PPO | Attending: Physician Assistant | Admitting: Physical Therapy

## 2021-02-19 DIAGNOSIS — M25551 Pain in right hip: Secondary | ICD-10-CM | POA: Diagnosis present

## 2021-02-19 DIAGNOSIS — R262 Difficulty in walking, not elsewhere classified: Secondary | ICD-10-CM | POA: Insufficient documentation

## 2021-02-19 DIAGNOSIS — M25651 Stiffness of right hip, not elsewhere classified: Secondary | ICD-10-CM

## 2021-02-19 DIAGNOSIS — M62838 Other muscle spasm: Secondary | ICD-10-CM | POA: Diagnosis present

## 2021-02-19 DIAGNOSIS — R2689 Other abnormalities of gait and mobility: Secondary | ICD-10-CM | POA: Diagnosis present

## 2021-02-19 NOTE — Therapy (Signed)
[facility] [address]  [address] [place], [place], [address] Phone: [phone]   Fax:  [phone]  Physical Therapy Evaluation  Patient Details  Name: Ruth Matthews MRN: [id number] Date of Birth: 1966-06-11 Referring Provider (PT): Jari Sportsman, [place]   Encounter Date: 02/19/2021   PT End of Session - 02/19/21 1401    Visit Number 1    Number of Visits 7    Date for PT Re-Evaluation 04/02/21    Authorization Type BCBS    PT Start Time 1307    PT Stop Time 1350    PT Time Calculation (min) 43 min    Activity Tolerance Patient tolerated treatment well    Behavior During Therapy [facility] for tasks assessed/performed           Past Medical History:  Diagnosis Date  . Anxiety   . DVT (deep venous thrombosis) (HCC)   . Elevated fasting blood sugar   . Gallstone   . Gestational diabetes 2000  . Hypertension   . 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 Anne Fu, Dr. Graciela Husbands  . Obesity   . Ovarian cyst    ruptured  . RMSF [facility] spotted fever) 2004   history of   . Varicose veins   . Ventricular tachycardia (HCC)   . Vitamin D deficiency     Past Surgical History:  Procedure Laterality Date  . CARDIAC CATHETERIZATION     no CAD  . CESAREAN SECTION    . CHOLECYSTECTOMY    . ENDOVENOUS ABLATION SAPHENOUS VEIN W/ LASER Bilateral 02/2011   Both legs a few weeks apart  . LIVER BIOPSY      There were no vitals filed for this visit.    Subjective Assessment - 02/19/21 1308    Subjective Patient reports that while stretching her quad on 01/31/21 she felt like her hip kept going and felt a loud snap. Reports that her MD suspects ITB irritation or partial hip dislocation. Used crutches for 1 week and recently weaned off of them. Has returned slowly to walking and the elliptical, which she uses every day. R hip pain is located over the lateral aspect, however initially it was occurring over the  entire hip, wrapping around the front and back. Hip feels weak. Notes pain when trying to do squats, lunges, walking, standing, getting up from sitting. Pain improved from initial onset. Better with rest, heat. Also noticing new onset of R knee discomfort wit walking. Denies LBP at time of onset. Initially had N/T down to the foot- unsure if it was caused by her foot pain, as she reports a hx of L Achilles tendonitis and an unhealed fx in the R "ball of the foot" which she recently received orthotics for. Had been in cam boot for the L Achilles and has also received an injection to the R foot with limited improvement.    Pertinent History ventricular tach, HTN, DVT, anxiety    Limitations Standing;Walking;House hold activities;Sitting;Lifting    How long can you sit comfortably? 30 min    How long can you stand comfortably? 5 min    How long can you walk comfortably? 5 min    Diagnostic tests 01/31/21 R hip xray: Mild degenerative changes of the right hip.  No acute findings; 01/31/21 R hip MRI: Mild right hip osteoarthritis    Patient Stated Goals decrease pain    Currently in Pain? Yes    Pain Location Hip  Pain Orientation Right;Lateral    Pain Descriptors / Indicators Aching    Pain Type Acute pain    Pain Radiating Towards lateral thigh              [facility] PT Assessment - 02/19/21 1320      Assessment   Medical Diagnosis Pain in R hip pain    Referring Provider (PT) Jari Sportsman, PA-C    Onset Date/Surgical Date 01/31/21    Next MD Visit not scheduled    Prior Therapy yes- not for hip      Precautions   Precautions None      Balance Screen   Has the patient fallen in the past 6 months No    Has the patient had a decrease in activity level because of a fear of falling?  No    Is the patient reluctant to leave their home because of a fear of falling?  No      Home [employer]ist information centre manager residence    Living Arrangements Spouse/significant other     Available Help at Discharge Family    Type of Home --   townhome   Home Access Stairs to enter    Entrance Stairs-Number of Steps 4    Entrance Stairs-Rails None    Home Layout Two level    Alternate Level Stairs-Number of Steps 12    Alternate Level Stairs-Rails Right    Home Equipment Crutches      Prior Function   Level of Independence Independent    Vocation Unemployed    Leisure working out, Dietitian, Advice worker   Overall Cognitive Status Within Functional Limits for tasks assessed      Sensation   Light Touch Appears Intact      Coordination   Gross Motor Movements are Fluid and Coordinated Yes      Posture/Postural Control   Posture/Postural Control Postural limitations    Posture Comments sitting with increased L wt shift; standing with slight thoracic kyphosis and anterior trunk lean      ROM / Strength   AROM / PROM / Strength AROM;Strength      AROM   AROM Assessment Site Hip    Right/Left Hip Right;Left    Right Hip Flexion 100    Right Hip External Rotation  35    Right Hip Internal Rotation  26   pain down the LE   Left Hip Flexion 105    Left Hip External Rotation  37    Left Hip Internal Rotation  29      Strength   Strength Assessment Site Hip;Knee;Ankle    Right/Left Hip Right;Left    Right Hip Flexion 4+/5    Right Hip External Rotation  4+/5   'tender"   Right Hip Internal Rotation 4+/5   'tender"   Left Hip Flexion 4+/5    Left Hip External Rotation 4/5    Left Hip Internal Rotation 4/5    Right/Left Knee Right;Left    Right Knee Flexion 4+/5    Right Knee Extension 4+/5    Left Knee Flexion 4+/5    Left Knee Extension 4+/5    Right/Left Ankle Right;Left    Right Ankle Dorsiflexion 4/5    Right Ankle Plantar Flexion 4-/5    Right Ankle Inversion 4-/5    Right Ankle Eversion 4-/5    Left Ankle Dorsiflexion 4+/5    Left Ankle Plantar Flexion 4/5    Left  Ankle Inversion 4/5    Left Ankle Eversion 4/5      Flexibility   Soft  Tissue Assessment /Muscle Length yes    Hamstrings B LE WNL    Quadriceps B WFL    ITB positive Ober's on R; mildly positive on L    Piriformis mild-moderately tight in fig 4 and KTOS; L WFL   groin pain on R     Palpation   Palpation comment TTP along mildine of L1-3, R greater trochanter, TFL and down ITB; TTP and increased soft tissue restricttion in R piriformis and glute med      Ambulation/Gait   Assistive device None    Gait Pattern Step-through pattern;Decreased stance time - right;Decreased weight shift to right;Antalgic    Ambulation Surface Level;Indoor    Gait velocity slightly dereased                      Objective measurements completed on examination: See above findings.               PT Education - 02/19/21 1401    Education Details prognosis, POC, HEP    Person(s) Educated Patient    Methods Explanation;Demonstration;Tactile cues;Handout;Verbal cues    Comprehension Verbalized understanding;Returned demonstration            PT Short Term Goals - 02/19/21 1409      PT SHORT TERM GOAL #1   Title Patient to be independent with initial HEP.    Time 3    Period Weeks    Status New    Target Date 03/12/21             PT Long Term Goals - 02/19/21 1409      PT LONG TERM GOAL #1   Title Patient to be independent with advanced HEP.    Time 6    Period Weeks    Status New    Target Date 04/02/21      PT LONG TERM GOAL #2   Title Patient to demonstrate B hip strength >/=4+/5.    Time 6    Period Weeks    Status New    Target Date 04/02/21      PT LONG TERM GOAL #3   Title Patient to demonstrate R hip AROM WFL and without pain limiting.    Time 6    Period Weeks    Status New    Target Date 04/02/21      PT LONG TERM GOAL #4   Title Patient to report tolerance for 1 hour of walking/standing without hip pain limiting.    Time 6    Period Weeks    Status New    Target Date 04/02/21      PT LONG TERM GOAL #5   Title  Patient to ascend/descend 13 steps with 1 handrail as needed with good quad and hips stability and no pain.    Time 6    Period Weeks    Status New    Target Date 04/02/21                  Plan - 02/19/21 1402    Clinical Impression Statement Patient is a 55 y/o F presenting to OPPT with c/o acute R hip pain since experiencing a loud snap in the hip while stretching on 01/31/21. Imaging was negative for acute findings. Pain has improved since initial onset, however still occurs over the lateral hip. Does note N/T down  to the R foot but unsure of the cause d/t hx of other orthopedic issues in B feet. Aggravating factors include squats, lunges, walking, standing, getting up from sitting. Patient is very active and would like to return to working to, kayaking, and hiking without pain. Patient today presenting with limited R hip AROM, marked L hip and B ankle weakness, positive R Ober's test, painful and limited R piriformis flexibility, TTP along midline of L1-3, R greater trochanter, TFL and down ITB;, TTP and increased soft tissue restriction in R piriformis and glute med, and gait deviations. Patient was educated on gentle stretching and strengthening HEP- patient reported understanding. Would benefit from skilled PT services 1x/week for 6 weeks to address aforementioned impairments.    Personal Factors and Comorbidities Age;Comorbidity 3+    Comorbidities ventricular tach, HTN, DVT, anxiety; per patient- L Achilles tendonitis, R metatarsal fx    Examination-Activity Limitations Sit;Bed Mobility;Sleep;Bend;Squat;Stairs;Carry;Stand;Toileting;Dressing;Transfers;Hygiene/Grooming;Lift;Locomotion Level    Examination-Participation Restrictions Church;Cleaning;Community Activity;Driving;Laundry;Meal Prep;Shop    Stability/Clinical Decision Making Stable/Uncomplicated    Clinical Decision Making Low    Rehab Potential Good    PT Frequency 1x / week    PT Duration 6 weeks    PT  Treatment/Interventions ADLs/Self Care Home Management;Cryotherapy;Electrical Stimulation;Iontophoresis 4mg /ml Dexamethasone;Moist Heat;Balance training;Therapeutic exercise;Therapeutic activities;Functional mobility training;Stair training;Gait training;Ultrasound;Neuromuscular re-education;Patient/family education;Manual techniques;Vasopneumatic Device;Taping;Energy conservation;Dry needling;Passive range of motion    PT Next Visit Plan hip FOTO; reassess HEP; progress hip AROM and mobility, hip and core stability; STM    Consulted and Agree with Plan of Care Patient           Patient will benefit from skilled therapeutic intervention in order to improve the following deficits and impairments:  Decreased activity tolerance,Decreased strength,Increased fascial restricitons,Pain,Decreased balance,Decreased mobility,Difficulty walking,Increased muscle spasms,Improper body mechanics,Decreased range of motion,Postural dysfunction,Impaired flexibility  Visit Diagnosis: Pain in right hip  Stiffness of right hip, not elsewhere classified  Other muscle spasm  Difficulty in walking, not elsewhere classified  Other abnormalities of gait and mobility     Problem List Patient Active Problem List   Diagnosis Date Noted  . Laryngopharyngeal reflux (LPR) 06/11/2020  . 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 Tinnitus, left 06/11/2020  . Tonsillar calculus 06/11/2020  . Gastroesophageal reflux disease 02/23/2020  . NAFLD (nonalcoholic fatty liver disease) 91/47/829511/08/2019  . Arthralgia 04/29/2018  . Migraines 08/06/2017  . Superficial thrombophlebitis 04/03/2017  . Anxiety and depression 10/27/2016  . Insomnia 07/17/2016  . History of DVT (deep vein thrombosis) 01/20/2016  . Allergic rhinitis 12/25/2015  . Obesity 12/25/2015  . Sinus pressure 12/25/2015  . Tachycardia 12/25/2015  . Varicose veins of both legs with edema 12/25/2015  .  Vitamin D deficiency 12/25/2015  . DVT (deep venous thrombosis) (HCC) 04/23/2015  . Paroxysmal ventricular tachycardia (HCC) 12/11/2013  . Essential hypertension, benign 12/11/2013  . Other and unspecified hyperlipidemia 12/11/2013  . Edema 12/11/2013  . Other malaise and fatigue 12/11/2013     Anette GuarneriYevgeniya Lenay Lovejoy, PT, DPT 02/19/21 2:13 PM   [facility] [place] [address]  [address] [place], [place], [address] Phone: [phone]   Fax:  [phone]  Name: Ruth Matthews MRN: [id number] Date of Birth: 1966/03/31

## 2021-02-26 ENCOUNTER — Ambulatory Visit: Payer: BC Managed Care – PPO

## 2021-03-05 ENCOUNTER — Ambulatory Visit: Payer: BC Managed Care – PPO | Admitting: Physical Therapy

## 2021-03-05 ENCOUNTER — Encounter: Payer: Self-pay | Admitting: Physical Therapy

## 2021-03-05 ENCOUNTER — Other Ambulatory Visit: Payer: Self-pay

## 2021-03-05 DIAGNOSIS — R262 Difficulty in walking, not elsewhere classified: Secondary | ICD-10-CM

## 2021-03-05 DIAGNOSIS — M25651 Stiffness of right hip, not elsewhere classified: Secondary | ICD-10-CM

## 2021-03-05 DIAGNOSIS — M25551 Pain in right hip: Secondary | ICD-10-CM | POA: Diagnosis not present

## 2021-03-05 DIAGNOSIS — M62838 Other muscle spasm: Secondary | ICD-10-CM

## 2021-03-05 DIAGNOSIS — R2689 Other abnormalities of gait and mobility: Secondary | ICD-10-CM

## 2021-03-05 NOTE — Therapy (Signed)
[facility] [address]  [address] [place], [place], [address] Phone: [phone]   Fax:  [phone]  Physical Therapy Treatment  Patient Details  Name: Ruth Matthews MRN: [id number] Date of Birth: [birth year] Referring Provider (PT): Jari Sportsman, [place]   Encounter Date: 03/05/2021   PT End of Session - 03/05/21 1611    Visit Number 2    Number of Visits 7    Date for PT Re-Evaluation 04/02/21    Authorization Type BCBS    PT Start Time 1531    PT Stop Time 1610    PT Time Calculation (min) 39 min    Activity Tolerance Patient tolerated treatment well    Behavior During Therapy [facility] for tasks assessed/performed           Past Medical History:  Diagnosis Date  . Anxiety   . DVT (deep venous thrombosis) (HCC)   . Elevated fasting blood sugar   . Gallstone   . Gestational diabetes 2000  . Hypertension   . 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 Anne Fu, Dr. Graciela Husbands  . Obesity   . Ovarian cyst    ruptured  . RMSF [facility] spotted fever) 2004   history of   . Varicose veins   . Ventricular tachycardia (HCC)   . Vitamin D deficiency     Past Surgical History:  Procedure Laterality Date  . CARDIAC CATHETERIZATION     no CAD  . CESAREAN SECTION    . CHOLECYSTECTOMY    . ENDOVENOUS ABLATION SAPHENOUS VEIN W/ LASER Bilateral 02/2011   Both legs a few weeks apart  . LIVER BIOPSY      There were no vitals filed for this visit.   Subjective Assessment - 03/05/21 1532    Subjective Has been doing her HEP and elliptical every other day while trying to build back up. Also started yoga. Improving little by little.    Pertinent History ventricular tach, HTN, DVT, anxiety    Diagnostic tests 01/31/21 R hip xray: Mild degenerative changes of the right hip.  No acute findings; 01/31/21 R hip CT: Mild right hip osteoarthritis    Patient Stated Goals decrease pain    Currently in Pain?  No/denies              [facility] PT Assessment - 03/05/21 0001      Strength   Right Hip ABduction 4+/5    Right Hip ADduction 4/5    Left Hip ABduction 4+/5    Left Hip ADduction 4+/5                         [facility] Adult PT Treatment/Exercise - 03/05/21 0001      Exercises   Exercises Lumbar;Knee/Hip      Knee/Hip Exercises: Stretches   Hip Flexor Stretch Right;Left;30 seconds;2 reps;1 rep    Hip Flexor Stretch Limitations 1x    Other Knee/Hip Stretches forward fold R TFL stretch 30"      Knee/Hip Exercises: Aerobic   Recumbent Bike L3 x 6 min      Knee/Hip Exercises: Standing   Other Standing Knee Exercises R 4 way hip with red TB 10x with 2 ski poles   good posture; limited hip extension     Knee/Hip Exercises: Sidelying   Clams 10x with red TB    Other Sidelying Knee/Hip Exercises R/L reverse clams with yellow TB 15x   towel  roll b/w knee; c/o twinge in L hip     Knee/Hip Exercises: Prone   Hip Extension Strengthening;Right;Left;1 set;10 reps    Hip Extension Limitations prone donkey kicks   limited ROM on R; cues for positioning   Other Prone Exercises R hip IR/ER AROM to tol 10x                  PT Education - 03/05/21 1611    Education Details update to HEP    Person(s) Educated Patient    Methods Explanation;Demonstration;Tactile cues;Verbal cues;Handout    Comprehension Verbalized understanding;Returned demonstration            PT Short Term Goals - 03/05/21 1615      PT SHORT TERM GOAL #1   Title Patient to be independent with initial HEP.    Time 3    Period Weeks    Status Achieved    Target Date 03/12/21             PT Long Term Goals - 03/05/21 1615      PT LONG TERM GOAL #1   Title Patient to be independent with advanced HEP.    Time 6    Period Weeks    Status On-going      PT LONG TERM GOAL #2   Title Patient to demonstrate B hip strength >/=4+/5.    Time 6    Period Weeks    Status On-going      PT  LONG TERM GOAL #3   Title Patient to demonstrate R hip AROM WFL and without pain limiting.    Time 6    Period Weeks    Status On-going      PT LONG TERM GOAL #4   Title Patient to report tolerance for 1 hour of walking/standing without hip pain limiting.    Time 6    Period Weeks    Status On-going      PT LONG TERM GOAL #5   Title Patient to ascend/descend 13 steps with 1 handrail as needed with good quad and hips stability and no pain.    Time 6    Period Weeks    Status On-going                 Plan - 03/05/21 1611    Clinical Impression Statement Patient arrived to session with report of improvement in the R hip. Continues to be active at home and reports compliance with HEP but has some questions. Reviewed prone hip rotation ROM with cueing for proper motion and alignment, with patient reporting good tolerance and understanding. Patient with report of little stretch sensation with supine TFL stretch; more success with sitting TFL stretch. Demonstrated limited R hip extension with donkey kicks; initially required cueing to avoid trunk rotation, but with good carryover. Patient noted small "twinge" in the L anterior hip, which was addressed with hip flexor stretching. Patient was able to tolerate good progression of ther-ex today with report of mild "tenderness" in R lateral hip. Updated HEP with exercises that were well-tolerated today. Patient reported understanding and without complaints at end of session.    Comorbidities ventricular tach, HTN, DVT, anxiety; per patient- L Achilles tendonitis, R metatarsal fx    PT Treatment/Interventions ADLs/Self Care Home Management;Cryotherapy;Electrical Stimulation;Iontophoresis 4mg /ml Dexamethasone;Moist Heat;Balance training;Therapeutic exercise;Therapeutic activities;Functional mobility training;Stair training;Gait training;Ultrasound;Neuromuscular re-education;Patient/family education;Manual techniques;Vasopneumatic Device;Taping;Energy  conservation;Dry needling;Passive range of motion    PT Next Visit Plan hip FOTO; progress hip AROM and mobility,  hip and core stability; STM    Consulted and Agree with Plan of Care Patient           Patient will benefit from skilled therapeutic intervention in order to improve the following deficits and impairments:  Decreased activity tolerance,Decreased strength,Increased fascial restricitons,Pain,Decreased balance,Decreased mobility,Difficulty walking,Increased muscle spasms,Improper body mechanics,Decreased range of motion,Postural dysfunction,Impaired flexibility  Visit Diagnosis: Pain in right hip  Stiffness of right hip, not elsewhere classified  Other muscle spasm  Difficulty in walking, not elsewhere classified  Other abnormalities of gait and mobility     Problem List Patient Active Problem List   Diagnosis Date Noted  . Laryngopharyngeal reflux (LPR) 06/11/2020  . 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 Tinnitus, left 06/11/2020  . Tonsillar calculus 06/11/2020  . Gastroesophageal reflux disease 02/23/2020  . NAFLD (nonalcoholic fatty liver disease) 40/98/1191  . Arthralgia 04/29/2018  . Migraines 08/06/2017  . Superficial thrombophlebitis 04/03/2017  . Anxiety and depression 10/27/2016  . Insomnia 07/17/2016  . History of DVT (deep vein thrombosis) 01/20/2016  . Allergic rhinitis 12/25/2015  . Obesity 12/25/2015  . Sinus pressure 12/25/2015  . Tachycardia 12/25/2015  . Varicose veins of both legs with edema 12/25/2015  . Vitamin D deficiency 12/25/2015  . DVT (deep venous thrombosis) (HCC) 04/23/2015  . Paroxysmal ventricular tachycardia (HCC) 12/11/2013  . Essential hypertension, benign 12/11/2013  . Other and unspecified hyperlipidemia 12/11/2013  . Edema 12/11/2013  . Other malaise and fatigue 12/11/2013     Anette Guarneri, PT, DPT 03/05/21 4:16 PM   [facility] [address]  [address] [place], [place], [address] Phone: [phone]   Fax:  [phone]  Name: Ruth Matthews MRN: [id number] Date of Birth: [birth year]

## 2021-03-12 ENCOUNTER — Other Ambulatory Visit: Payer: Self-pay

## 2021-03-12 ENCOUNTER — Ambulatory Visit: Payer: BC Managed Care – PPO

## 2021-03-12 DIAGNOSIS — R2689 Other abnormalities of gait and mobility: Secondary | ICD-10-CM

## 2021-03-12 DIAGNOSIS — M62838 Other muscle spasm: Secondary | ICD-10-CM

## 2021-03-12 DIAGNOSIS — M25651 Stiffness of right hip, not elsewhere classified: Secondary | ICD-10-CM

## 2021-03-12 DIAGNOSIS — M25551 Pain in right hip: Secondary | ICD-10-CM | POA: Diagnosis not present

## 2021-03-12 DIAGNOSIS — R262 Difficulty in walking, not elsewhere classified: Secondary | ICD-10-CM

## 2021-03-12 NOTE — Therapy (Signed)
[facility] [address]  [address] [place], [place], [address] Phone: [phone]   Fax:  [phone]  Physical Therapy Treatment  Patient Details  Name: Ruth Matthews MRN: [id number] Date of Birth: 1966-02-18 Referring Provider (PT): Jari Sportsman, [place]   Encounter Date: 03/12/2021   PT End of Session - 03/12/21 1636    Visit Number 3    Number of Visits 7    Date for PT Re-Evaluation 04/02/21    Authorization Type BCBS    PT Start Time 1533    PT Stop Time 1613    PT Time Calculation (min) 40 min    Activity Tolerance Patient tolerated treatment well    Behavior During Therapy [facility] for tasks assessed/performed           Past Medical History:  Diagnosis Date  . Anxiety   . DVT (deep venous thrombosis) (HCC)   . Elevated fasting blood sugar   . Gallstone   . Gestational diabetes 2000  . Hypertension   . 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 Anne Fu, Dr. Graciela Husbands  . Obesity   . Ovarian cyst    ruptured  . [facility] spotted fever) 2004   history of   . Varicose veins   . Ventricular tachycardia (HCC)   . Vitamin D deficiency     Past Surgical History:  Procedure Laterality Date  . CARDIAC CATHETERIZATION     no CAD  . CESAREAN SECTION    . CHOLECYSTECTOMY    . ENDOVENOUS ABLATION SAPHENOUS VEIN W/ LASER Bilateral 02/2011   Both legs a few weeks apart  . LIVER BIOPSY      There were no vitals filed for this visit.   Subjective Assessment - 03/12/21 1538    Subjective Pt reports that she is feeling much stronger, less pain doing "normal things".    Pertinent History ventricular tach, HTN, DVT, anxiety    Diagnostic tests 01/31/21 R hip xray: Mild degenerative changes of the right hip.  No acute findings; 01/31/21 R hip CT: Mild right hip osteoarthritis    Patient Stated Goals decrease pain    Currently in Pain? Yes    Pain Score 3     Pain Location Hip    Pain  Orientation Right    Pain Descriptors / Indicators Aching    Pain Type Acute pain              [facility] PT Assessment - 03/12/21 0001      Observation/Other Assessments   Focus on Therapeutic Outcomes (FOTO)  Hip: 57, Predicted: 23                         [facility] Adult PT Treatment/Exercise - 03/12/21 0001      Exercises   Exercises Lumbar;Knee/Hip      Lumbar Exercises: Stretches   Other Lumbar Stretch Exercise prayer stretch with orange pball 3x15"      Lumbar Exercises: Supine   Pelvic Tilt 10 reps    Pelvic Tilt Limitations 10x3" PPT with ball    Dead Bug Limitations hands and feet up, arms remain in position LEs kick out 20 second holds    Other Supine Lumbar Exercises B march with ball squeeze 10 reps    Other Supine Lumbar Exercises TrA with pedals, 10 reps      Knee/Hip Exercises: Aerobic   Recumbent Bike L3 x 6 min  Knee/Hip Exercises: Standing   Hip Flexion Stengthening;Both;10 reps;Knee straight    Hip Flexion Limitations R TB    Hip ADduction Strengthening;Both;10 reps    Hip ADduction Limitations R TB    Hip Abduction Stengthening;10 reps;Knee straight;Both    Abduction Limitations R TB    Hip Extension Stengthening;Both;10 reps;Knee straight    Extension Limitations R TB      Knee/Hip Exercises: Supine   Bridges Strengthening;Both;10 reps    Bridges Limitations red TB                    PT Short Term Goals - 03/05/21 1615      PT SHORT TERM GOAL #1   Title Patient to be independent with initial HEP.    Time 3    Period Weeks    Status Achieved    Target Date 03/12/21             PT Long Term Goals - 03/05/21 1615      PT LONG TERM GOAL #1   Title Patient to be independent with advanced HEP.    Time 6    Period Weeks    Status On-going      PT LONG TERM GOAL #2   Title Patient to demonstrate B hip strength >/=4+/5.    Time 6    Period Weeks    Status On-going      PT LONG TERM GOAL #3   Title Patient to  demonstrate R hip AROM WFL and without pain limiting.    Time 6    Period Weeks    Status On-going      PT LONG TERM GOAL #4   Title Patient to report tolerance for 1 hour of walking/standing without hip pain limiting.    Time 6    Period Weeks    Status On-going      PT LONG TERM GOAL #5   Title Patient to ascend/descend 13 steps with 1 handrail as needed with good quad and hips stability and no pain.    Time 6    Period Weeks    Status On-going                 Plan - 03/12/21 1637    Clinical Impression Statement Pt responded well to treatment. Progressed standing hip exercises w/o hand support but she did require it for SL standing on her R leg. Also progressed core strengthening exercises focusing on TrA activation and increasing the need for abdominal bracing. Pt did note "tenderness and tightness" in her low back after the core stabilization exercises, tried seated prayer stretching to address this with a little relief noted but still "tenderness" felt. Pt otherwise responded well.    Personal Factors and Comorbidities Age;Comorbidity 3+    Comorbidities ventricular tach, HTN, DVT, anxiety; per patient- L Achilles tendonitis, R metatarsal fx    PT Frequency 1x / week    PT Duration 6 weeks    PT Treatment/Interventions ADLs/Self Care Home Management;Cryotherapy;Electrical Stimulation;Iontophoresis 4mg /ml Dexamethasone;Moist Heat;Balance training;Therapeutic exercise;Therapeutic activities;Functional mobility training;Stair training;Gait training;Ultrasound;Neuromuscular re-education;Patient/family education;Manual techniques;Vasopneumatic Device;Taping;Energy conservation;Dry needling;Passive range of motion    PT Next Visit Plan progress hip AROM and mobility, hip and core stability; STM    Consulted and Agree with Plan of Care Patient           Patient will benefit from skilled therapeutic intervention in order to improve the following deficits and impairments:   Decreased activity tolerance,Decreased strength,Increased fascial restricitons,Pain,Decreased  balance,Decreased mobility,Difficulty walking,Increased muscle spasms,Improper body mechanics,Decreased range of motion,Postural dysfunction,Impaired flexibility  Visit Diagnosis: Pain in right hip  Stiffness of right hip, not elsewhere classified  Other muscle spasm  Difficulty in walking, not elsewhere classified  Other abnormalities of gait and mobility     Problem List Patient Active Problem List   Diagnosis Date Noted  . Laryngopharyngeal reflux (LPR) 06/11/2020  . 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 Tinnitus, left 06/11/2020  . Tonsillar calculus 06/11/2020  . Gastroesophageal reflux disease 02/23/2020  . NAFLD (nonalcoholic fatty liver disease) 78/93/8101  . Arthralgia 04/29/2018  . Migraines 08/06/2017  . Superficial thrombophlebitis 04/03/2017  . Anxiety and depression 10/27/2016  . Insomnia 07/17/2016  . History of DVT (deep vein thrombosis) 01/20/2016  . Allergic rhinitis 12/25/2015  . Obesity 12/25/2015  . Sinus pressure 12/25/2015  . Tachycardia 12/25/2015  . Varicose veins of both legs with edema 12/25/2015  . Vitamin D deficiency 12/25/2015  . DVT (deep venous thrombosis) (HCC) 04/23/2015  . Paroxysmal ventricular tachycardia (HCC) 12/11/2013  . Essential hypertension, benign 12/11/2013  . Other and unspecified hyperlipidemia 12/11/2013  . Edema 12/11/2013  . Other malaise and fatigue 12/11/2013    Darleene Cleaver, PTA 03/12/2021, 5:06 PM  [facility] [address]  [address] [place], [place], [address] Phone: [phone]   Fax:  [phone]  Name: Ruth Matthews MRN: [id number] Date of Birth: 05-Mar-1966

## 2021-03-19 ENCOUNTER — Other Ambulatory Visit: Payer: Self-pay

## 2021-03-19 ENCOUNTER — Encounter: Payer: Self-pay | Admitting: Physical Therapy

## 2021-03-19 ENCOUNTER — Ambulatory Visit: Payer: BC Managed Care – PPO | Attending: Physician Assistant | Admitting: Physical Therapy

## 2021-03-19 DIAGNOSIS — M25551 Pain in right hip: Secondary | ICD-10-CM | POA: Diagnosis not present

## 2021-03-19 DIAGNOSIS — M62838 Other muscle spasm: Secondary | ICD-10-CM | POA: Diagnosis present

## 2021-03-19 DIAGNOSIS — R2689 Other abnormalities of gait and mobility: Secondary | ICD-10-CM | POA: Diagnosis present

## 2021-03-19 DIAGNOSIS — R262 Difficulty in walking, not elsewhere classified: Secondary | ICD-10-CM

## 2021-03-19 DIAGNOSIS — M25651 Stiffness of right hip, not elsewhere classified: Secondary | ICD-10-CM | POA: Insufficient documentation

## 2021-03-19 NOTE — Therapy (Signed)
[facility] [address]  [place], [place], [address] Phone: [phone]   Fax:  [phone]  Physical Therapy Treatment  Patient Details  Name: Ruth Matthews MRN: [id number] Date of Birth: 11/06/1965 Referring Provider (PT): Dwana Melena, [place]   Encounter Date: 03/19/2021   PT End of Session - 03/19/21 1623    Visit Number 4    Number of Visits 7    Date for PT Re-Evaluation 04/02/21    Authorization Type BCBS    PT Start Time 1528    PT Stop Time 1617    PT Time Calculation (min) 49 min    Activity Tolerance Patient tolerated treatment well    Behavior During Therapy [facility] for tasks assessed/performed           Past Medical History:  Diagnosis Date  . Anxiety   . DVT (deep venous thrombosis) ([place])   . Elevated fasting blood sugar   . Gallstone   . Gestational diabetes 2000  . Hypertension   . 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 Marlou Porch, Dr. Caryl Comes  . Obesity   . Ovarian cyst    ruptured  . [facility] spotted fever) 2004   history of   . Varicose veins   . Ventricular tachycardia ([place])   . Vitamin D deficiency     Past Surgical History:  Procedure Laterality Date  . CARDIAC CATHETERIZATION     no CAD  . CESAREAN SECTION    . CHOLECYSTECTOMY    . ENDOVENOUS ABLATION SAPHENOUS VEIN W/ LASER Bilateral 02/2011   Both legs a few weeks apart  . LIVER BIOPSY      There were no vitals filed for this visit.   Subjective Assessment - 03/19/21 1528    Subjective Feels like she is getting stronger. Still has pain when walking downhill, up stairs, when carrying items. Also still notes swelling in the hip after exercise.    Pertinent History ventricular tach, HTN, DVT, anxiety    Diagnostic tests 01/31/21 R hip xray: Mild degenerative changes of the right hip.  No acute findings; 01/31/21 R hip CT: Mild right hip osteoarthritis    Patient Stated Goals decrease pain     Currently in Pain? No/denies              [facility] PT Assessment - 03/19/21 1532      Assessment   Medical Diagnosis Pain in R hip pain    Referring Provider (PT) Dwana Melena, PA-C    Onset Date/Surgical Date 01/31/21      AROM   Right Hip Flexion 106    Right Hip External Rotation  40    Right Hip Internal Rotation  31    Left Hip External Rotation  38    Left Hip Internal Rotation  28      Strength   Right Hip Flexion 4+/5    Right Hip Extension 4/5    Right Hip External Rotation  4+/5    Right Hip Internal Rotation 4+/5    Right Hip ABduction 4+/5    Right Hip ADduction 4+/5    Left Hip Flexion 5/5    Left Hip Extension 4/5    Left Hip External Rotation 4+/5    Left Hip Internal Rotation 4+/5    Left Hip ABduction 4+/5  [facility] Adult PT Treatment/Exercise - 03/19/21 0001      Ambulation/Gait   Stairs Yes    Stairs Assistance 7: Independent    Stair Management Technique No rails    Number of Stairs 13    Height of Stairs 8    Gait Comments alternating reciprocal pattern with out pain      Knee/Hip Exercises: Aerobic   Elliptical L1 x 6 min      Knee/Hip Exercises: Standing   Hip Extension Stengthening;10 reps;Knee straight;Right;Left    Extension Limitations 2 ski poles; red loop around ankles   cues to maintain L knee extension   Step Down Right;2 sets;5 reps;Hand Hold: 2;Step Height: 6"    Step Down Limitations 2 ski poles; 5x step up/back, 5x step up/over w/ heel touch   good quad control; no pain     Knee/Hip Exercises: Supine   Bridges Strengthening;1 set;5 reps    Bridges Limitations marching bridge                  PT Education - 03/19/21 1623    Education Details update to HEP; discussion on possible pain management modalities    Person(s) Educated Patient    Methods Explanation;Demonstration;Tactile cues;Verbal cues;Handout    Comprehension Verbalized understanding;Returned demonstration             PT Short Term Goals - 03/19/21 1532      PT SHORT TERM GOAL #1   Title Patient to be independent with initial HEP.    Time 3    Period Weeks    Status Achieved    Target Date 03/12/21             PT Long Term Goals - 03/19/21 1532      PT LONG TERM GOAL #1   Title Patient to be independent with advanced HEP.    Time 6    Period Weeks    Status Partially Met   met for current     PT LONG TERM GOAL #2   Title Patient to demonstrate B hip strength >/=4+/5.    Time 6    Period Weeks    Status On-going   met with exception of B hip extension     PT LONG TERM GOAL #3   Title Patient to demonstrate R hip AROM WFL and without pain limiting.    Time 6    Period Weeks    Status Achieved      PT LONG TERM GOAL #4   Title Patient to report tolerance for 1 hour of walking/standing without hip pain limiting.    Time 6    Period Weeks    Status On-going   tolerating ~30 min     PT LONG TERM GOAL #5   Title Patient to ascend/descend 13 steps with 1 handrail as needed with good quad and hips stability and no pain.    Time 6    Period Weeks    Status Achieved                 Plan - 03/19/21 1623    Clinical Impression Statement Patient reporting improvement in perceived hip strength since starting therapy, however still notes pain with walking downhill, ascending stairs, and when carrying items. Also notes remaining swelling in the hip after exercise. R hip AROM is now [facility] and pain-free. Hip strength has improved in L hip flexion, ER, and IR, with remaining weakness in B hip extension. Patient reports ambulating 1.5  mile/~30 min and 35 minutes on elliptical before increase in pain. Patient was able to demonstrate alternating reciprocal stair climbing pattern up and down stairs without apparent deviations and no c/o pain. Worked on exercises to address remaining strength deficits and updated them into HEP. Discussed possible modalities for pain management, with patient  requesting to defer this until next session. Patient is progressing well towards goals. Would continue to benefit from skilled PT services to address pain and functional activity tolerance.    Personal Factors and Comorbidities Age;Comorbidity 3+    Comorbidities ventricular tach, HTN, DVT, anxiety; per patient- L Achilles tendonitis, R metatarsal fx    PT Frequency 1x / week    PT Duration 6 weeks    PT Treatment/Interventions ADLs/Self Care Home Management;Cryotherapy;Electrical Stimulation;Iontophoresis 60m/ml Dexamethasone;Moist Heat;Balance training;Therapeutic exercise;Therapeutic activities;Functional mobility training;Stair training;Gait training;Ultrasound;Neuromuscular re-education;Patient/family education;Manual techniques;Vasopneumatic Device;Taping;Energy conservation;Dry needling;Passive range of motion    PT Next Visit Plan progress hip AROM and mobility, hip and core stability; STM    Consulted and Agree with Plan of Care Patient           Patient will benefit from skilled therapeutic intervention in order to improve the following deficits and impairments:  Decreased activity tolerance,Decreased strength,Increased fascial restricitons,Pain,Decreased balance,Decreased mobility,Difficulty walking,Increased muscle spasms,Improper body mechanics,Decreased range of motion,Postural dysfunction,Impaired flexibility  Visit Diagnosis: Pain in right hip  Stiffness of right hip, not elsewhere classified  Other muscle spasm  Difficulty in walking, not elsewhere classified  Other abnormalities of gait and mobility     Problem List Patient Active Problem List   Diagnosis Date Noted  . Laryngopharyngeal reflux (LPR) 06/11/2020  . 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 Tinnitus, left 06/11/2020  . Tonsillar calculus 06/11/2020  . Gastroesophageal reflux disease 02/23/2020  . NAFLD (nonalcoholic fatty liver disease)  08/30/2019  . Arthralgia 04/29/2018  . Migraines 08/06/2017  . Superficial thrombophlebitis 04/03/2017  . Anxiety and depression 10/27/2016  . Insomnia 07/17/2016  . History of DVT (deep vein thrombosis) 01/20/2016  . Allergic rhinitis 12/25/2015  . Obesity 12/25/2015  . Sinus pressure 12/25/2015  . Tachycardia 12/25/2015  . Varicose veins of both legs with edema 12/25/2015  . Vitamin D deficiency 12/25/2015  . [place] 04/[place]  . Parox[place] tachycardia ([place] 12/11/2013  . Essential hypertension, benign 12/11/2013  . Other and unspecified hyperlipidemia 12/11/2013  . Edema 12/11/2013  . Other malaise and fatigue 12/11/2013     YJanene Harvey PT, DPT 03/19/21 4:25 PM   [facility] [address] [place] [address]Phone: [phone]  Fax:  [phone] Name: ELidie GladeMRN: [id number] of Birth: 201-29-1967

## 2021-04-02 ENCOUNTER — Other Ambulatory Visit: Payer: Self-pay

## 2021-04-02 ENCOUNTER — Ambulatory Visit: Payer: BC Managed Care – PPO | Admitting: Physical Therapy

## 2021-04-02 ENCOUNTER — Encounter: Payer: Self-pay | Admitting: Physical Therapy

## 2021-04-02 DIAGNOSIS — M25651 Stiffness of right hip, not elsewhere classified: Secondary | ICD-10-CM

## 2021-04-02 DIAGNOSIS — M62838 Other muscle spasm: Secondary | ICD-10-CM

## 2021-04-02 DIAGNOSIS — M25551 Pain in right hip: Secondary | ICD-10-CM

## 2021-04-02 DIAGNOSIS — R262 Difficulty in walking, not elsewhere classified: Secondary | ICD-10-CM

## 2021-04-02 DIAGNOSIS — R2689 Other abnormalities of gait and mobility: Secondary | ICD-10-CM

## 2021-04-02 NOTE — Therapy (Addendum)
[facility] [address]  [place], [place], [address] Phone: [phone]   Fax:  [phone]  Physical Therapy Treatment  Patient Details  Name: Ruth Matthews MRN: [id number] Date of Birth: December 03, 1965 Referring Provider (PT): Dwana Melena, [place]   Encounter Date: 04/02/2021   PT End of Session - 04/02/21 1357     Visit Number 5    Number of Visits 7    Date for PT Re-Evaluation 04/02/21    Authorization Type BCBS    PT Start Time 0998    PT Stop Time 3382    PT Time Calculation (min) 42 min    Activity Tolerance Patient tolerated treatment well    Behavior During Therapy [facility] for tasks assessed/performed             Past Medical History:  Diagnosis Date   Anxiety    DVT (deep venous thrombosis) (HCC)    Elevated fasting blood sugar    Gallstone    Gestational diabetes 2000   Hypertension    Left anterior fascicular block    idiopathic 1/12-Dr. Marlou Porch, Dr. Caryl Comes   Obesity    Ovarian cyst    ruptured   RMSF [facility] spotted fever) 2004   history of    Varicose veins    Ventricular tachycardia ([place])    Vitamin D deficiency     Past Surgical History:  Procedure Laterality Date   CARDIAC CATHETERIZATION     no CAD   CESAREAN SECTION     CHOLECYSTECTOMY     ENDOVENOUS ABLATION SAPHENOUS VEIN W/ LASER Bilateral 02/2011   Both legs a few weeks apart   LIVER BIOPSY      There were no vitals filed for this visit.   Subjective Assessment - 04/02/21 1315     Subjective Hip feels better than last time. ABle to carry things up the stairs now. Feels that her achilles and foot problem are bothering her more than the hip. Has not returned to hiking or kayaking which she does not feel ready for yet. Feels comfortable transitioning to HEP today.    Pertinent History ventricular tach, HTN, DVT, anxiety    Diagnostic tests 01/31/21 R hip xray: Mild degenerative changes of the right hip.  No  acute findings; 01/31/21 R hip CT: Mild right hip osteoarthritis    Patient Stated Goals decrease pain    Currently in Pain? No/denies                [facility] PT Assessment - 04/02/21 0001       Observation/Other Assessments   Focus on Therapeutic Outcomes (FOTO)  Hip: 69, Predicted: 71      AROM   Right Hip Flexion 104    Right Hip External Rotation  42    Right Hip Internal Rotation  35      Strength   Right Hip Extension 4+/5    Left Hip Extension 4+/5                           [facility] Adult PT Treatment/Exercise - 04/02/21 0001       Lumbar Exercises: Stretches   Other Lumbar Stretch Exercise R supine TFL stretch 30" with strap      Lumbar Exercises: Aerobic   Recumbent Bike L5 x 6 min      Knee/Hip Exercises: Standing   Step Down Right;5 reps;Step Height: 6";3 sets;Hand Hold: 1;Hand  Hold: 0    Step Down Limitations step down + hee; touch with green TB; last 5 reps holding yellow medball at chest   c/o anterior knee discomfort   Other Standing Knee Exercises hip hinge w/ dowel 5x, with 6# in each hand; attempted with green TB as well   good effort to maintain neutral spine     Knee/Hip Exercises: Supine   Single Leg Bridge Strengthening;Right;Left;1 set;5 reps   fairly good ROM                   PT Education - 04/02/21 1357     Education Details update/consolidation of HEP; discussion on objective progress and remaining impairments    Person(s) Educated Patient    Methods Explanation;Demonstration;Tactile cues;Verbal cues;Handout    Comprehension Verbalized understanding;Returned demonstration              PT Short Term Goals - 04/02/21 1400       PT SHORT TERM GOAL #1   Title Patient to be independent with initial HEP.    Time 3    Period Weeks    Status Achieved    Target Date 03/12/21               PT Long Term Goals - 04/02/21 1401       PT LONG TERM GOAL #1   Title Patient to be independent with advanced  HEP.    Time 6    Period Weeks    Status Achieved   met for current     PT LONG TERM GOAL #2   Title Patient to demonstrate B hip strength >/=4+/5.    Time 6    Period Weeks    Status Achieved   met with exception of B hip extension     PT LONG TERM GOAL #3   Title Patient to demonstrate R hip AROM WFL and without pain limiting.    Time 6    Period Weeks    Status Achieved      PT LONG TERM GOAL #4   Title Patient to report tolerance for 1 hour of walking/standing without hip pain limiting.    Time 6    Period Weeks    Status Partially Met   able to complete 1.4 mile walk but with pain remaining     PT LONG TERM GOAL #5   Title Patient to ascend/descend 13 steps with 1 handrail as needed with good quad and hips stability and no pain.    Time 6    Period Weeks    Status Achieved                   Plan - 04/02/21 1357     Clinical Impression Statement Patient arrived to session with report of improvement in R hip pain as she is now able to carry things up the stairs. Has not returned to hiking or kayaking as she does not feel ready for it yet. Notes that she feels comfortable transitioning to HEP today. R hip AROM has improved and is now pain-free and functional. Hip strength goal has been met. Patient reports tolerance for a 1.4 mile walk, which she is able to complete more quickly and notes that it takes longer for the pain to come on. Worked on progressive hip and quad strengthening ther-ex to increase strength challenge. Patient was able to perform all ther-ex without c/o hip pain and took good care to perform with good form. Updated  HEP to include exercises that were well-tolerated today. Patient reported understanding and without complaints at end of session. Patient is independent with HEP and ready for 30 day hold at this time.    Personal Factors and Comorbidities Age;Comorbidity 3+    Comorbidities ventricular tach, HTN, DVT, anxiety; per patient- L Achilles  tendonitis, R metatarsal fx    PT Frequency 1x / week    PT Duration 6 weeks    PT Treatment/Interventions ADLs/Self Care Home Management;Cryotherapy;Electrical Stimulation;Iontophoresis 39m/ml Dexamethasone;Moist Heat;Balance training;Therapeutic exercise;Therapeutic activities;Functional mobility training;Stair training;Gait training;Ultrasound;Neuromuscular re-education;Patient/family education;Manual techniques;Vasopneumatic Device;Taping;Energy conservation;Dry needling;Passive range of motion    PT Next Visit Plan 30 day hold at this time    Consulted and Agree with Plan of Care Patient             Patient will benefit from skilled therapeutic intervention in order to improve the following deficits and impairments:  Decreased activity tolerance, Decreased strength, Increased fascial restricitons, Pain, Decreased balance, Decreased mobility, Difficulty walking, Increased muscle spasms, Improper body mechanics, Decreased range of motion, Postural dysfunction, Impaired flexibility  Visit Diagnosis: Pain in right hip  Stiffness of right hip, not elsewhere classified  Other muscle spasm  Difficulty in walking, not elsewhere classified  Other abnormalities of gait and mobility     Problem List Patient Active Problem List   Diagnosis Date Noted   Laryngopharyngeal reflux (LPR) 06/11/2020   Sensorineural hearing loss (SNHL) of left ear with unrestricted hearing of right ear 06/11/2020   Tinnitus, left 06/11/2020   Tonsillar calculus 06/11/2020   Gastroesophageal reflux disease 02/23/2020   NAFLD (nonalcoholic fatty liver disease) 08/30/2019   Arthralgia 04/29/2018   Migraines 08/06/2017   Superficial thrombophlebitis 04/03/2017   Anxiety and depression 10/27/2016   Insomnia 07/17/2016   History of DVT (deep vein thrombosis) 01/20/2016   Allergic rhinitis 12/25/2015   Obesity 12/25/2015   Sinus pressure 12/25/2015   Tachycardia 12/25/2015   Varicose veins of both legs  with edema 12/25/2015   Vitamin D deficiency 12/25/2015   DVT (deep venous thrombosis) ([place] 04/23/2015   Paroxysmal ventricular tachycardia ([place] 12/11/2013   Essential hypertension, benign 12/11/2013   Other and unspecified hyperlipidemia 12/11/2013   Edema 12/11/2013   Other malaise and fatigue 12/11/2013     YJanene Harvey PT, DPT 04/02/21 5:10 PM    [facility] [address] [address]Phone: [phone]  Fax:  [phone] Name: Ruth SeglerMRN: [id number] of Birth: 2December 03, 1967  PHYSICAL THERAPY DISCHARGE SUMMARY  Visits from Start of Care: 5  Current functional level related to goals / functional outcomes: See above clinical impression; patient did not return during 30 day hold   Remaining deficits: Limited walking/standing tolerance   Education / Equipment: HEP  Plan: Patient agrees to discharge.  Patient goals were partially met. Patient is being discharged due to meeting the stated rehab goals.      YJanene Harvey PT, DPT 05/07/21 9:02 AM

## 2021-04-02 NOTE — Patient Instructions (Signed)
Access Code: [email or web address] URL: [email or web address] Date: 04/02/2021 Prepared by: Georgina Peer  Exercises Forward Step Down - 2 x daily - 7 x weekly - 2 sets - 10 reps Half Deadlift with Kettlebell - 2 x daily - 7 x weekly - 2 sets - 10 reps Single Leg Bridge - 2 x daily - 7 x weekly - 2 sets - 10 reps Supine ITB Stretch with Strap - 2 x daily - 7 x weekly - 2 sets - 30 sec hold Clamshell with Resistance - 2 x daily - 7 x weekly - 2 sets - 10 reps

## 2021-04-16 ENCOUNTER — Ambulatory Visit: Payer: BC Managed Care – PPO | Admitting: Obstetrics & Gynecology

## 2021-07-21 ENCOUNTER — Other Ambulatory Visit: Payer: Self-pay | Admitting: Family Medicine

## 2021-07-21 DIAGNOSIS — Z1231 Encounter for screening mammogram for malignant neoplasm of breast: Secondary | ICD-10-CM

## 2021-07-25 ENCOUNTER — Other Ambulatory Visit: Payer: Self-pay

## 2021-07-25 ENCOUNTER — Ambulatory Visit
Admission: RE | Admit: 2021-07-25 | Discharge: 2021-07-25 | Disposition: A | Discharge status: Home / Self Care | Payer: BC Managed Care – PPO | Source: Ambulatory Visit | Attending: Family Medicine | Admitting: Family Medicine

## 2021-07-25 DIAGNOSIS — Z1231 Encounter for screening mammogram for malignant neoplasm of breast: Secondary | ICD-10-CM

## 2021-07-28 NOTE — Progress Notes (Signed)
Cardiology Office Note:    Date:  07/28/2021   ID:  [name], [place] 22-Apr-1966, MRN [id number]  PCP:  Macy Mis, MD   [facility] Providers Cardiologist:  Meriam Sprague, MD      Referring MD: Macy Mis, MD   Follow-up for paroxysmal ventricular tachycardia and  History of Present Illness:    Ruth Matthews is a 55 y.o. female with a hx of hypertension, fascicular VT, DVT, and hyperlipidemia.  She was previously seen by Dr. Anne Fu for palpitations and exertional presyncope which had occurred in the gym in 2012.  She was diagnosed with wide-complex tachycardia and negatively deflecting QRS complexes in lateral leads.  She was also noted to have right axis deviation and negative deflection in 2 3 and aVF.  She was given an IV and amiodarone converted her rhythm after about 6 minutes.  She had a brief episode of atrial fibrillation postconversion but remained and sinus rhythm during her hospitalization.  She was seen and evaluated by Dr. Graciela Husbands who recommended verapamil for treatment of her bifascicular VT.  A cardiac MRI was unremarkable.  Her echocardiogram was unremarkable.  Her cardiac catheterization was also unremarkable and showed no coronary artery disease.  She was seen by Dr. Delton See 07/24/2020.  During that time she was feeling more fatigued and shortness of breath with exertion.  She also reported palpitations with exertion.  An echocardiogram 01/06/2021 which showed normal biventricular function and no significant valvular disease.  A coronary CTA was recommended as well as a exercise tolerance test.  Neither were completed.  She followed up with Dr. Shari Prows 01/13/2021.  During that time she reported doing much better.  Her fatigue had improved.  She has been exercising daily on her elliptical machine.  She has lost around 42 pounds in the previous year.  She had anxiety and reported occasional episodes of heart racing especially after increased  physical activity/hiking.  She denied current chest discomfort.  She did note occasional swelling in her lower extremities which she was trying to manage with her diet.  She reports the clinic today for follow-up evaluation states she feels well.  She continues to be  physically active.  She [place] and did not notice any palpitations after her hike.  She is following a heart healthy diet.  We reviewed her most recent cholesterol panel that was drawn at her PCP.  Her LDL cholesterol was slightly elevated.  We reviewed high-fiber diet and avoiding high cholesterol foods.  She reports that she will go to [facility] today to receive her second COVID-vaccine.  She had a anaphylactic reaction 40 minutes after having her first COVID vaccination.  She is receiving her vaccination at her allergist office.  She was instructed to premedicate.  She asked about menopause and cardiac health.  We reviewed changes that are common for women in menopause.  She expressed understanding.  She is in the process of trying to lose weight.  She has tried intermittent fasting in the past and becomes too hungry on the diet.  She does better with a calorie restricted diet.  I will give her the heart healthy diet instructions, recommend exercising 150 minutes weekly, continue current medications, and have her follow-up in 12 months.  Today she denies chest pain, shortness of breath, lower extremity edema, fatigue, palpitations, melena, hematuria, hemoptysis, diaphoresis, weakness, presyncope, syncope, orthopnea, and PND.   Past Medical History:  Diagnosis Date  Anxiety    DVT (deep venous thrombosis) (HCC)    Elevated fasting blood sugar    Gallstone    Gestational diabetes 2000   Hypertension    Left anterior fascicular block    idiopathic 1/12-Dr. Anne Fu, Dr. Graciela Husbands   Obesity    Ovarian cyst    ruptured   RMSF [facility] spotted fever) 2004   history of    Varicose veins     Ventricular tachycardia    Vitamin D deficiency     Past Surgical History:  Procedure Laterality Date   CARDIAC CATHETERIZATION     no CAD   CESAREAN SECTION     CHOLECYSTECTOMY     ENDOVENOUS ABLATION SAPHENOUS VEIN W/ LASER Bilateral 02/2011   Both legs a few weeks apart   LIVER BIOPSY      Current Medications: No outpatient medications have been marked as taking for the 07/29/21 encounter (Appointment) with Ronney Asters, NP.     Allergies:   Latex, Azithromycin, Garlic, Onion, Other, and Levaquin [levofloxacin]   Social History   Socioeconomic History   Marital status: Married    Spouse name: Not on file   Number of children: Not on file   Years of education: Not on file   Highest education level: Not on file  Occupational History   Not on file  Tobacco Use   Smoking status: Never   Smokeless tobacco: Never  Vaping Use   Vaping Use: Never used  Substance and Sexual Activity   Alcohol use: Yes    Alcohol/week: 0.0 standard drinks    Comment: occasionally   Drug use: No   Sexual activity: Yes    Partners: Male    Comment: 1st intercourse-22, partners- 3, married- 30 yrs   Other Topics Concern   Not on file  Social History Narrative   Not on file   Social Determinants of Health   Financial Resource Strain: Not on file  Food Insecurity: Not on file  Transportation Needs: Not on file  Physical Activity: Not on file  Stress: Not on file  Social Connections: Not on file     Family History: The patient's family history includes COPD in her mother; Hypertension in her mother; Other in her father. There is no history of Colon cancer or Esophageal cancer.  ROS:   Please see the history of present illness.     All other systems reviewed and are negative.   Risk Assessment/Calculations:           Physical Exam:    VS:  There were no vitals taken for this visit.    Wt Readings from Last 3 Encounters:  01/13/21 226 lb (102.5 kg)  07/24/20 238 lb  (108 kg)  06/21/20 233 lb (105.7 kg)     GEN:  Well nourished, well developed in no acute distress HEENT: Normal NECK: No JVD; No carotid bruits LYMPHATICS: No lymphadenopathy CARDIAC: RRR, no murmurs, rubs, gallops RESPIRATORY:  Clear to auscultation without rales, wheezing or rhonchi  ABDOMEN: Soft, non-tender, non-distended MUSCULOSKELETAL:  No edema; No deformity  SKIN: Warm and dry NEUROLOGIC:  Alert and oriented x 3 PSYCHIATRIC:  Normal affect    EKGs/Labs/Other Studies Reviewed:    The following studies were reviewed today:  Echocardiogram 01/06/2021 IMPRESSIONS   1. Left ventricular ejection fraction, by estimation, is 60 to 65%. Left  ventricular ejection fraction by 3D volume is 60 %. The left ventricle has  normal function. The left ventricle has no regional  wall motion  abnormalities. Left ventricular diastolic   parameters were normal.   2. Right ventricular systolic function is normal. The right ventricular  size is normal. There is normal pulmonary artery systolic pressure.   3. The mitral valve is normal in structure. No evidence of mitral valve  regurgitation. No evidence of mitral stenosis.   4. The aortic valve is normal in structure. Aortic valve regurgitation is  not visualized. No aortic stenosis is present.   5. The inferior vena cava is normal in size with greater than 50%  respiratory variability, suggesting right atrial pressure of 3 mmHg.    Cardiac MRI Findings:  All 4 cardiac chambers were normal in size and function.  There was no ASD, VSD or pericardial effusion.  Mitral, aortic and  tricuspid valves appeared morphologically normal.  Quantitative EF  was 61% ( EDV-119cc, ESV-47cc, SV 72cc) Hyperenhancement images  showed no infiltration or scar tissue in the LV     Impression:        1)    Normal cardiac MRI          2)    EF 61% with no RWMA's  3)    No infiltration or hyperenhancement in LV     EKG:  EKG is  ordered today.  The ekg  ordered today demonstrates normal sinus rhythm no ST or T wave deviation 61 bpm  Recent Labs: No results found for requested labs within last 8760 hours.  Recent Lipid Panel    Component Value Date/Time   CHOL 190 07/24/2020 0912   TRIG 132 07/24/2020 0912   HDL 76 07/24/2020 0912   CHOLHDL 2.5 07/24/2020 0912   CHOLHDL 3.8 10/29/2010 1019   VLDL 24 10/29/2010 1019   LDLCALC 91 07/24/2020 0912    ASSESSMENT & PLAN    Paroxysmal ventricular tachycardia-denies recent episodes of irregular or accelerated heart rate.  Was diagnosed in 2012.  Her cardiac MRI 1/12 was normal.  Cardiac catheterization showed normal coronary anatomy with absence of lesions.  Plan for exercise stress test if recurrence of symptoms. Continue verapamil Heart healthy low-sodium diet-salty 6 given Increase physical activity as tolerated  Hyperlipidemia-LDL 91 on 07/24/20,  Heart healthy low-sodium high-fiber diet Increase physical activity as tolerated Repeat fasting lipids and LFTs  DOE-reports significant improvement after weight loss.  Weight stable.  Continues to be very physically active using elliptical daily. Maintain physical activity Heart healthy low-sodium diet  Obesity-  Weight today 235.8 pounds.  Reports that she has gained most of her weight back after going back to her normal diet. Maintain physical activity-goal 30 minutes of moderate physical activity 5 times per week. Continue weight loss Heart healthy low-sodium diet  Disposition: Follow-up with Dr. Shari Prows in 12 months.       Medication Adjustments/Labs and Tests Ordered: Current medicines are reviewed at length with the patient today.  Concerns regarding medicines are outlined above.  No orders of the defined types were placed in this encounter.  No orders of the defined types were placed in this encounter.   There are no Patient Instructions on file for this visit.   Signed, Ronney Asters, NP  07/28/2021 7:17 AM       Notice: This dictation was prepared with Dragon dictation along with smaller phrase technology. Any transcriptional errors that result from this process are unintentional and may not be corrected upon review.  I spent 14 minutes examining this patient, reviewing medications, and using patient centered shared decision  making involving her cardiac care.  Prior to her visit I spent greater than 20 minutes reviewing her past medical history,  medications, and prior cardiac tests.

## 2021-07-29 ENCOUNTER — Other Ambulatory Visit: Payer: Self-pay

## 2021-07-29 ENCOUNTER — Encounter (HOSPITAL_BASED_OUTPATIENT_CLINIC_OR_DEPARTMENT_OTHER): Payer: Self-pay | Admitting: General Practice

## 2021-07-29 ENCOUNTER — Ambulatory Visit (HOSPITAL_BASED_OUTPATIENT_CLINIC_OR_DEPARTMENT_OTHER): Payer: BC Managed Care – PPO | Admitting: General Practice

## 2021-07-29 ENCOUNTER — Ambulatory Visit: Payer: BC Managed Care – PPO

## 2021-07-29 VITALS — BP 134/76 | HR 61 | Ht 65.0 in | Wt 235.8 lb

## 2021-07-29 DIAGNOSIS — I4729 Other ventricular tachycardia: Secondary | ICD-10-CM | POA: Diagnosis not present

## 2021-07-29 DIAGNOSIS — R0609 Other forms of dyspnea: Secondary | ICD-10-CM | POA: Diagnosis not present

## 2021-07-29 DIAGNOSIS — E669 Obesity, unspecified: Secondary | ICD-10-CM | POA: Diagnosis not present

## 2021-07-29 DIAGNOSIS — E785 Hyperlipidemia, unspecified: Secondary | ICD-10-CM | POA: Diagnosis not present

## 2021-07-29 NOTE — Patient Instructions (Signed)
Medication Instructions:  Continue current medications  *If you need a refill on your cardiac medications before your next appointment, please call your pharmacy*   Lab Work: None Ordered  Testing/Procedures: None Ordered   Follow-Up: At [facility], you and your health needs are our priority.  As part of our continuing mission to provide you with exceptional heart care, we have created designated Provider Care Teams.  These Care Teams include your primary Cardiologist (physician) and Advanced Practice Providers (APPs -  Physician Assistants and Nurse Practitioners) who all work together to provide you with the care you need, when you need it.  We recommend signing up for the patient portal called "MyChart".  Sign up information is provided on this After Visit Summary.  MyChart is used to connect with patients for Virtual Visits (Telemedicine).  Patients are able to view lab/test results, encounter notes, upcoming appointments, etc.  Non-urgent messages can be sent to your provider as well.   To learn more about what you can do with MyChart, go to [email or web address].    Your next appointment:   12 month(s)  The format for your next appointment:   In Person  Provider:   You may see Meriam Sprague, MD or one of the following Advanced Practice Providers on your designated Care Team:   Tereso Newcomer, PA-C Vin Hayden, [place]   Other Instructions Exercise gold 150 mins  Heart Failure Eating Plan Heart failure, also called congestive heart failure, occurs when your heart does not pump blood well enough to meet your body's needs for oxygen-rich blood. Heart failure is a long-term (chronic) condition. Living with heart failure can be challenging. Following your health care provider's instructions about a healthy lifestyle and working with a dietitian to choose the right foods may help to improve your symptoms. An eating plan for someone with heart failure will include changes that  limit the intake of salt (sodium) and unhealthy fat. What are tips for following this plan? Reading food labels Check food labels for the amount of sodium per serving. Choose foods that have less than 140 mg (milligrams) of sodium in each serving. Check food labels for the number of calories per serving. This is important if you need to limit your daily calorie intake to lose weight. Check food labels for the serving size. If you eat more than one serving, you will be eating more sodium and calories than what is listed on the label. Look for foods that are labeled as "sodium-free," "very low sodium," or "low sodium." Foods labeled as "reduced sodium" or "lightly salted" may still have more sodium than what is recommended for you. Cooking Avoid adding salt when cooking. Ask your health care provider or dietitian before using salt substitutes. Season food with salt-free seasonings, spices, or herbs. Check the label of seasoning mixes to make sure they do not contain salt. Cook with heart-healthy oils, such as olive, canola, soybean, or sunflower oil. Do not fry foods. Cook foods using low-fat methods, such as baking, boiling, grilling, and broiling. Limit unhealthy fats when cooking by: Removing the skin from poultry, such as chicken. Removing all visible fats from meats. Skimming the fat off from stews, soups, and gravies before serving them. Meal planning  Limit your intake of: Processed, canned, or prepackaged foods. Foods that are high in trans fat, such as fried foods. Sweets, desserts, sugary drinks, and other foods with added sugar. Full-fat dairy products, such as whole milk. Eat a balanced diet. This may include: 4-5  servings of fruit each day and 4-5 servings of vegetables each day. At each meal, try to fill one-half of your plate with fruits and vegetables. Up to 6-8 servings of whole grains each day. Up to 2 servings of lean meat, poultry, or fish each day. One serving of meat is  equal to 3 oz (85 g). This is about the same size as a deck of cards. 2 servings of low-fat dairy each day. Heart-healthy fats. Healthy fats called omega-3 fatty acids are found in foods such as flaxseed and cold-water fish like sardines, salmon, and mackerel. Aim to eat 25-35 g (grams) of fiber a day. Foods that are high in fiber include apples, broccoli, carrots, beans, peas, and whole grains. Do not add salt or condiments that contain salt (such as soy sauce) to foods before eating. When eating at a restaurant, ask that your food be prepared with less salt or no salt, if possible. Try to eat 2 or more vegetarian meals each week. Eat more home-cooked food and eat less restaurant, buffet, and fast food. General information Do not eat more than 2,300 mg of sodium a day. The amount of sodium that is recommended for you may be lower, depending on your condition. Maintain a healthy body weight as directed. Ask your health care provider what a healthy weight is for you. Check your weight every day. Work with your health care provider and dietitian to make a plan that is right for you to lose weight or maintain your current weight. Limit how much fluid you drink. Ask your health care provider or dietitian how much fluid you can have each day. Limit or avoid alcohol as told by your health care provider or dietitian. Recommended foods Fruits All fresh, frozen, and canned fruits. Dried fruits, such as raisins, prunes, and cranberries. Vegetables All fresh vegetables. Vegetables that are frozen without sauce or added salt. Low-sodium or sodium-free canned vegetables. Grains Bread with less than 80 mg of sodium per slice. Whole-wheat pasta, quinoa, and brown rice. Oats and oatmeal. Barley. Millet. Grits and cream of wheat. Whole-grain and whole-wheat cold cereal. Meats and other protein foods Lean cuts of meat. Skinless chicken and [place]. Fish with high omega-3 fatty acids, such as salmon, sardines,  and other cold-water fishes. Eggs. Dried beans, peas, and edamame. Unsalted nuts and nut butters. Dairy Low-fat or nonfat (skim) milk and dried milk. Rice milk, soy milk, and almond milk. Low-fat or nonfat yogurt. Small amounts of reduced-sodium block cheese. Low-sodium cottage cheese. Fats and oils Olive, canola, soybean, flaxseed, avocado, or sunflower oil. Sweets and desserts Applesauce. Granola bars. Sugar-free pudding and gelatin. Frozen fruit bars. Seasoning and other foods Fresh and dried herbs. Lemon or lime juice. Vinegar. Low-sodium ketchup. Salt-free marinades, salad dressings, sauces, and seasonings. The items listed above may not be a complete list of foods and beverages you can eat. Contact a dietitian for more information. Foods to avoid Fruits Fruits that are dried with sodium-containing preservatives. Vegetables Canned vegetables. Frozen vegetables with sauce or seasonings. Creamed vegetables. [place] fries. Onion rings. Pickled vegetables and sauerkraut. Grains Bread with more than 80 mg of sodium per slice. Hot or cold cereal with more than 140 mg sodium per serving. Salted pretzels and crackers. Prepackaged breadcrumbs. Bagels, croissants, and biscuits. Meats and other protein foods Ribs and chicken wings. Bacon, ham, pepperoni, bologna, salami, and packaged luncheon meats. Hot dogs, bratwurst, and sausage. Canned meat. Smoked meat and fish. Salted nuts and seeds. Dairy Whole milk, half-and-half, and  cream. Buttermilk. Processed cheese, cheese spreads, and cheese curds. Regular cottage cheese. Feta cheese. Shredded cheese. String cheese. Fats and oils Butter, lard, shortening, ghee, and bacon fat. Canned and packaged gravies. Seasoning and other foods Onion salt, garlic salt, table salt, and sea salt. Marinades. Regular salad dressings. Relishes, pickles, and olives. Meat flavorings and tenderizers, and bouillon cubes. Horseradish, ketchup, and mustard. Worcestershire  sauce. Teriyaki sauce, soy sauce (including reduced sodium). Hot sauce and Tabasco sauce. Steak sauce, fish sauce, oyster sauce, and cocktail sauce. Taco seasonings. Barbecue sauce. Tartar sauce. The items listed above may not be a complete list of foods and beverages you should avoid. Contact a dietitian for more information. Summary A heart failure eating plan includes changes that limit your intake of sodium and unhealthy fat, and it may help you lose weight or maintain a healthy weight. Your health care provider may also recommend limiting how much fluid you drink. Most people with heart failure should eat no more than 2,300 mg of salt (sodium) a day. The amount of sodium that is recommended for you may be lower, depending on your condition. Contact your health care provider or dietitian before making any major changes to your diet. This information is not intended to replace advice given to you by your health care provider. Make sure you discuss any questions you have with your health care provider. Document Revised: 05/20/2020 Document Reviewed: 05/20/2020 Elsevier Patient Education  2022 [employer].

## 2021-08-11 ENCOUNTER — Encounter (HOSPITAL_BASED_OUTPATIENT_CLINIC_OR_DEPARTMENT_OTHER): Payer: Self-pay

## 2021-08-15 NOTE — Telephone Encounter (Signed)
Fax med rec request x 2 10-25 and 10-26

## 2021-09-22 ENCOUNTER — Ambulatory Visit (INDEPENDENT_AMBULATORY_CARE_PROVIDER_SITE_OTHER): Payer: BC Managed Care – PPO | Admitting: Pulmonary Disease

## 2021-09-22 ENCOUNTER — Other Ambulatory Visit: Payer: Self-pay

## 2021-09-22 ENCOUNTER — Encounter: Payer: Self-pay | Admitting: Pulmonary Disease

## 2021-09-22 VITALS — BP 140/84 | HR 82 | Temp 98.1°F | Ht 66.0 in | Wt 241.2 lb

## 2021-09-22 DIAGNOSIS — M7989 Other specified soft tissue disorders: Secondary | ICD-10-CM

## 2021-09-22 DIAGNOSIS — R0602 Shortness of breath: Secondary | ICD-10-CM

## 2021-09-22 DIAGNOSIS — I2694 Multiple subsegmental pulmonary emboli without acute cor pulmonale: Secondary | ICD-10-CM

## 2021-09-22 MED ORDER — ALBUTEROL SULFATE HFA 108 (90 BASE) MCG/ACT IN AERS: 2.0000 | INHALATION_SPRAY | Freq: Four times a day (QID) | RESPIRATORY_TRACT | 2 refills | Status: DC | PRN

## 2021-09-22 NOTE — Progress Notes (Signed)
Subjective:   PATIENT ID: Ruth Matthews GENDER: female DOB: 1966-10-13, MRN: [id number]   HPI  Chief Complaint  Patient presents with   Consult    PE Unable to take deep breaths    Reason for Visit: New consult for pulmonary embolism  [name] is a 55 year old female with fibromyalgia, recent PE, hx DVT in 2012 who presents for symptoms related to her PE.  She was diagnosed with PE in October after [place] from [place] ([place] and COVID vaccination (Oct 11).  She was treated for post-op DVT with warfarin x 3 months in 2012. Hematology with negative hypercoag panel at the time. After her PE diagnosis she is now on lifetime anticoagulation per Hematology. She is compliant with Eliquis.   She was seen by her PCP on 09/09/21. She has had ongoing dyspnea on exertion chest discomfort dizziness and fatigue. Today she reports she has improved dyspnea by 80% at rest only feels 60% with exertion compared to baseline with persistent symptoms that have not changed.  Worsens with activity, bending over and lifting objects <10 lbs (cat). Shortness of breath with laying down has been more difficult. Some wheezing at night. Some coughing during the day where she is clearing her throat. Recent nasal congestion. At baseline, she has seasonal allergies and able to exercise everyday for 30 - 60 minutes (iFit: elliptical, walking, hiking). After the PE, she completely stopped activity. She has returned to exercise every other day due to fatigue. Denies any recent viral illnesses.   For her allergies she is taking allegra, flonase, neti pot. She reports she has ongoing lower extremity pain that has worsened.  Social History: Teacher  I have personally reviewed patient's past medical/family/social history, allergies, current medications.  Past Medical History:  Diagnosis Date   Anxiety    DVT (deep venous thrombosis) (HCC)    Elevated fasting blood sugar     Gallstone    Gestational diabetes 2000   Hypertension    Left anterior fascicular block    idiopathic 1/12-Dr. Anne FuSkains, Dr. Graciela HusbandsKlein   Obesity    Ovarian cyst    ruptured   [facility]MSF [facility]([place] spotted fever) 2004   history of    Varicose veins    Ventricular tachycardia    Vitamin D deficiency      Family History  Problem Relation Age of Onset   Hypertension Mother    COPD Mother    Other Father        died from accident, h/o lyme disease   Colon cancer Neg Hx    Esophageal cancer Neg Hx      Social History   Occupational History   Not on file  Tobacco Use   Smoking status: Never   Smokeless tobacco: Never  Vaping Use   Vaping Use: Never used  Substance and Sexual Activity   Alcohol use: Yes    Alcohol/week: 0.0 standard drinks    Comment: occasionally   Drug use: No   Sexual activity: Yes    Partners: Male    Comment: 1st intercourse-22, partners- 3, married- 30 yrs     Allergies  Allergen Reactions   Latex Itching, Swelling and Rash   Azithromycin     Upset stomach    Garlic Swelling    Tongue swelling and sensation of throat swelling Tongue swelling and sensation of throat swelling    Onion Swelling    Tongue swelling and sensation of throat swelling  Tongue swelling and sensation of throat swelling    Other     "whitening toothpaste"  "earring metals," "nickels and metals"    Levaquin [Levofloxacin]      Outpatient Medications Prior to Visit  Medication Sig Dispense Refill   ELIQUIS 5 MG TABS tablet Take 5 mg by mouth 2 (two) times daily.     fexofenadine (ALLEGRA) 180 MG tablet Take 4 tablets by mouth daily.     fluticasone (FLONASE) 50 MCG/ACT nasal spray Place 1 spray into both nostrils daily.     Multiple Vitamin (MULTIVITAMIN ADULT PO) Take by mouth.     omeprazole (PRILOSEC) 20 MG capsule Take 20 mg by mouth daily.     Propylene Glycol (SYSTANE BALANCE) 0.6 % SOLN Place 1 drop into both eyes daily as needed (dry eyes).     verapamil  (CALAN-SR) 240 MG CR tablet Take 1 tablet (240 mg total) by mouth daily. 90 tablet 2   EPINEPHrine 0.3 mg/0.3 mL IJ SOAJ injection AUTO INJECTOR INTO OUTER THIGH PRN ANAPHYLAXIS INJECTION 30 DAYS (Patient not taking: Reported on 09/22/2021)     sertraline (ZOLOFT) 50 MG tablet Take 75 mg by mouth daily. 1.5 pill a day (Patient not taking: Reported on 07/29/2021)     No facility-administered medications prior to visit.    Review of Systems  Constitutional:  Negative for chills, diaphoresis, fever, malaise/fatigue and weight loss.  HENT:  Positive for congestion. Negative for ear pain and sore throat.   Respiratory:  Positive for shortness of breath. Negative for cough, hemoptysis, sputum production and wheezing.   Cardiovascular:  Positive for chest pain. Negative for palpitations and leg swelling.  Gastrointestinal:  Positive for abdominal pain. Negative for heartburn and nausea.  Genitourinary:  Negative for frequency.  Musculoskeletal:  Positive for joint pain. Negative for myalgias.  Skin:  Negative for itching and rash.  Neurological:  Negative for dizziness, weakness and headaches.  Endo/Heme/Allergies:  Does not bruise/bleed easily.  Psychiatric/Behavioral:  Negative for depression. The patient is nervous/anxious.     Objective:   Vitals:   09/22/21 1019  BP: 140/84  Pulse: 82  Temp: 98.1 F (36.7 C)  TempSrc: Oral  SpO2: 98%  Weight: 241 lb 3.2 oz (109.4 kg)  Height: 5\' 6"  (1.676 m)   SpO2: 98 % O2 Device: None (Room air)  Physical Exam: General: Well-appearing, no acute distress HENT: [place], AT Eyes: EOMI, no scleral icterus Respiratory: Clear to auscultation bilaterally.  No crackles, wheezing or rales Cardiovascular: RRR, -M/R/G, no JVD Extremities:-Edema,-tenderness Neuro: AAO x4, CNII-XII grossly intact Psych: Normal mood, normal affect  Data Reviewed:  Imaging: CTA 08/05/21 - Multiple bilateral pulmonary emboli in the upper and lower lobes  bilaterally  PFT: None on file  Labs: CBC    Component Value Date/Time   WBC 5.2 07/24/2020 0912   WBC 6.9 09/20/2019 1230   RBC 5.03 07/24/2020 0912   RBC 5.28 (H) 09/20/2019 1230   HGB 13.7 07/24/2020 0912   HCT 41.3 07/24/2020 0912   PLT 218 07/24/2020 0912   MCV 82 07/24/2020 0912   MCH 27.2 07/24/2020 0912   MCH 26.5 09/20/2019 1230   MCHC 33.2 07/24/2020 0912   MCHC 31.7 09/20/2019 1230   RDW 14.6 07/24/2020 0912   LYMPHSABS 1.3 09/04/2019 0815   MONOABS 0.5 09/04/2019 0815   EOSABS 0.2 09/04/2019 0815   BASOSABS 0.0 09/04/2019 0815   Absolute eos 09/04/19 -200     Assessment & Plan:   Discussion: Ms. Railynn  Caris is a 55 year old female with fibromyalgia, recent PE in October 2022 and hx DVT in 2012 who presents for persistent symptoms including leg pain.  Pulmonary embolism Shortness of breath Deconditioning --CONTINUE anticoagulation --START albuterol inhaler. Take two puffs before activity and before bedtime for wheezing and shortness of breath --ARRANGE for pulmonary function tests --LE Dopplers  Health Maintenance Immunization History  Administered Date(s) Administered   Influenza Inj Mdck Quad Pf 07/27/2019   Influenza, High Dose Seasonal PF 01/03/2020   Influenza,inj,Quad PF,6+ Mos 08/12/2018   PFIZER(Purple Top)SARS-COV-2 Vaccination 12/18/2019, 01/03/2020   Pfizer Covid-19 Vaccine Bivalent Booster 42yrs & up 07/29/2021   Tdap 07/21/2021   CT Lung Screen - not indicated  Orders Placed This Encounter  Procedures   Pulmonary function test    Standing Status:   Future    Standing Expiration Date:   09/22/2022    Scheduling Instructions:     Next available    Order Specific Question:   Where should this test be performed?    Answer:   [facility]    Order Specific Question:   Full PFT: includes the following: basic spirometry, spirometry pre & post bronchodilator, diffusion capacity (DLCO), lung volumes    Answer:   Full PFT   Meds  ordered this encounter  Medications   albuterol (VENTOLIN HFA) 108 (90 Base) MCG/ACT inhaler    Sig: Inhale 2 puffs into the lungs every 6 (six) hours as needed for wheezing or shortness of breath.    Dispense:  8 g    Refill:  2    No follow-ups on file. With NP after PFTs  I have spent a total time of 45-minutes on the day of the appointment reviewing prior documentation, coordinating care and discussing medical diagnosis and plan with the patient/family. Imaging, labs and tests included in this note have been reviewed and interpreted independently by me.  Mirka Barbone Mechele Collin, MD [facility] 09/22/2021 10:22 AM  Office Number [phone]

## 2021-09-22 NOTE — Patient Instructions (Signed)
Pulmonary embolism Shortness of breath Deconditioning --START albuterol inhaler. Take two puffs before activity and before bedtime for wheezing and shortness of breath --ARRANGE for pulmonary function tests  Follow-up with NP after PFTs

## 2021-09-23 ENCOUNTER — Ambulatory Visit (HOSPITAL_COMMUNITY)
Admission: RE | Admit: 2021-09-23 | Discharge: 2021-09-23 | Disposition: A | Discharge status: Home / Self Care | Payer: BC Managed Care – PPO | Source: Ambulatory Visit | Attending: Pulmonary Disease | Admitting: Pulmonary Disease

## 2021-09-23 ENCOUNTER — Telehealth: Payer: Self-pay | Admitting: Pulmonary Disease

## 2021-09-23 DIAGNOSIS — M7989 Other specified soft tissue disorders: Secondary | ICD-10-CM | POA: Diagnosis present

## 2021-09-23 NOTE — Telephone Encounter (Signed)
Call report received from [facility] with [facility]. Per Alcario Drought, pt was negative for acute DVT, possible chronic DVT in the right.  The full report should be avail soon in epic to be able to be viewed. Routing to Dr. Everardo All as an Lorain Childes.

## 2021-09-23 NOTE — Telephone Encounter (Signed)
Results reviewed. No evidence of DVT in the left. 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 Please contact patient with these results.

## 2021-09-23 NOTE — Telephone Encounter (Signed)
Dr. Everardo All,  Please see mychart message from pt: Ruth Matthews [facility] (supporting Luciano Cutter, MD) 19 hours ago (2:22 PM)   Hi Dr. Everardo All I wanted to correct my answer to one of your questions. You asked how much better I feel since having pulmonary emboli in.my lungs. At rest, I feel 80% better, but when moving/exercising I only feel about 60% better. I still get exhausted feeling, heavy legs, dizzy... Thanks. [facility]

## 2021-09-23 NOTE — Telephone Encounter (Signed)
Called and spoke with pt letting her know the results of the vascular study per Dr. Everardo All and she verbalized understanding. Nothing further needed.

## 2021-09-24 ENCOUNTER — Encounter: Payer: Self-pay | Admitting: Pulmonary Disease

## 2021-09-29 ENCOUNTER — Other Ambulatory Visit: Payer: Self-pay

## 2021-09-29 ENCOUNTER — Encounter: Payer: Self-pay | Admitting: Physical Therapy

## 2021-09-29 ENCOUNTER — Ambulatory Visit: Payer: BC Managed Care – PPO | Attending: Family Medicine | Admitting: Physical Therapy

## 2021-09-29 DIAGNOSIS — M542 Cervicalgia: Secondary | ICD-10-CM | POA: Diagnosis present

## 2021-09-29 DIAGNOSIS — M25651 Stiffness of right hip, not elsewhere classified: Secondary | ICD-10-CM | POA: Diagnosis present

## 2021-09-29 DIAGNOSIS — R262 Difficulty in walking, not elsewhere classified: Secondary | ICD-10-CM | POA: Diagnosis present

## 2021-09-29 DIAGNOSIS — R2689 Other abnormalities of gait and mobility: Secondary | ICD-10-CM | POA: Diagnosis present

## 2021-09-29 DIAGNOSIS — M62838 Other muscle spasm: Secondary | ICD-10-CM | POA: Insufficient documentation

## 2021-09-29 DIAGNOSIS — M25551 Pain in right hip: Secondary | ICD-10-CM | POA: Insufficient documentation

## 2021-09-29 NOTE — Therapy (Signed)
[facility] [address]  [place], [place], [address] Phone: [phone]   Fax:  [phone]  Physical Therapy Evaluation  Patient Details  Name: Ruth Matthews MRN: [id number] Date of Birth: August 06, 1966 Referring Provider (PT): Katherina Mires MD   Encounter Date: 09/29/2021   PT End of Session - 09/29/21 1158     Visit Number 1    Number of Visits 12    Date for PT Re-Evaluation 10/17/21    Authorization Type BCBS    PT Start Time 53    PT Stop Time 1155    PT Time Calculation (min) 55 min    Activity Tolerance Patient tolerated treatment well    Behavior During Therapy [facility] for tasks assessed/performed             Past Medical History:  Diagnosis Date   Anxiety    DVT (deep venous thrombosis) (HCC)    Elevated fasting blood sugar    Gallstone    Gestational diabetes 2000   Hypertension    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 Marlou Porch, Dr. Caryl Comes   Obesity    Ovarian cyst    ruptured   RMSF [facility] spotted fever) 2004   history of    Varicose veins    Ventricular tachycardia    Vitamin D deficiency     Past Surgical History:  Procedure Laterality Date   CARDIAC CATHETERIZATION     no CAD   CESAREAN SECTION     CHOLECYSTECTOMY     ENDOVENOUS ABLATION SAPHENOUS VEIN W/ LASER Bilateral 02/2011   Both legs a few weeks apart   LIVER BIOPSY      There were no vitals filed for this visit.    Subjective Assessment - 09/29/21 1059     Subjective Patient reports she had a very rough year, had blood clots following COVID vaccine.  The neck pain started when she had some hip pain in [place], and it"popped" when she rolled over.  That got better, but the morning she had blood clots (Aug 05, 2021) she passed out and hurt it again.  Feels stiff and awkward turning.    Pertinent History history of PE, DOE, fatigue, anxiety, fibromyalgia, DVT 2017, [place] spotted  feaver, HTN    Limitations Reading;Writing;House hold activities    Diagnostic tests 11/23- MRI cervical There is a small central disc herniation at C4-5 that effaces the thecal sac without compressing the cord.  Mild degenerative disc disease and mild disc bulge at C5-6.  Otherwise no significant abnormality.   MRA Angio Neck IMPRESSION: Normal    Patient Stated Goals be able to move better without pain    Currently in Pain? Yes    Pain Score 5     Pain Location Neck    Pain Orientation Left;Posterior    Pain Descriptors / Indicators Tightness;Aching;Headache    Pain Type Chronic pain    Pain Radiating Towards towards  L shoulder    Pain Onset More than a month ago   Aug 05, 2021   Pain Frequency Constant    Aggravating Factors  turning neck, sleeping on side    Pain Relieving Factors unable to use heat due to blood clots.    Effect of Pain on Daily Activities unable to work                [facility] PT Assessment - 09/29/21 0001  Assessment   Medical Diagnosis M54.2 (ICD-10-CM) - Cervicalgia    Referring Provider (PT) Katherina Mires MD    Onset Date/Surgical Date 08/05/21    Hand Dominance Right    Next MD Visit cardiologist on Friday    Prior Therapy no      Precautions   Precautions None      Restrictions   Weight Bearing Restrictions No      Balance Screen   Has the patient fallen in the past 6 months No    Has the patient had a decrease in activity level because of a fear of falling?  No    Is the patient reluctant to leave their home because of a fear of falling?  No      Home Environment   Living Environment Private residence    Living Arrangements Spouse/significant other    Type of [place] to enter    Entrance Stairs-Number of Steps 4    Entrance Stairs-Rails None    Home Layout Two level    Alternate Level Stairs-Number of Steps 12    Alternate Level Stairs-Rails Right      Prior Function   Level of Independence Independent     Vocation Full time employment;On disability    Museum/gallery curator - bend, lift, stoop    Leisure exercise, read      Cognition   Overall Cognitive Status Within Functional Limits for tasks assessed      Observation/Other Assessments   Observations Pt. entered independently without deviation or device, no apparent distress.    Focus on Therapeutic Outcomes (FOTO)  44   62 predicted after 11 visits     Posture/Postural Control   Posture/Postural Control Postural limitations    Postural Limitations Rounded Shoulders;Forward head      ROM / Strength   AROM / PROM / Strength AROM;Strength      AROM   Overall AROM  Deficits    Overall AROM Comments tested in sitting, reported tightness throughout shoulder and neck but not pain with movements    AROM Assessment Site Shoulder;Cervical    Right/Left Shoulder Right;Left    Right Shoulder Extension 140 Degrees    Right Shoulder ABduction 140 Degrees    Right Shoulder Internal Rotation --   functional to T10   Right Shoulder External Rotation --   functional to T4, tight   Left Shoulder Flexion 135 Degrees    Left Shoulder ABduction 125 Degrees    Left Shoulder Internal Rotation --   functional to T10   Left Shoulder External Rotation --   functional to T4, tight   Cervical Flexion 40    Cervical Extension 25    Cervical - Right Rotation 55    Cervical - Left Rotation 40      Strength   Overall Strength Within functional limits for tasks performed    Overall Strength Comments tested in sitting, 5/5 throughout bil UE, all myotomes.      Palpation   Spinal mobility Increased lordosis in cervical spine    Palpation comment tenderness/tightness throughout bil UT and levator scapulae.  slight tenderness over supraspinatus L shoulder.                        Objective measurements completed on examination: See above findings.       [facility] Adult PT Treatment/Exercise - 09/29/21 0001       Exercises  Exercises Neck      Neck Exercises: Seated   Neck Retraction 5 reps;5 secs    Shoulder Rolls Backwards;5 reps    Postural Training scap squeezes 5 x 5 sec hold      Neck Exercises: Supine   Neck Retraction 5 reps;5 secs    Neck Retraction Limitations tactile cues for technique      Neck Exercises: Sidelying   Other Sidelying Exercise open book x 10 bil for thoracic mobilization      Neck Exercises: Stretches   Upper Trapezius Stretch 1 rep;10 seconds;Right;Left    Upper Trapezius Stretch Limitations demo    Levator Stretch Right;Left;3 reps;10 seconds    Levator Stretch Limitations can add hand for additional stretch    Corner Stretch 3 reps;30 seconds    Corner Stretch Limitations tried doorway as well, preferred corner      Lumbar Exercises: Supine   Pelvic Tilt 5 reps;5 seconds    Pelvic Tilt Limitations VC to relax neck    Other Supine Lumbar Exercises LTR x 5 bil                     PT Education - 09/29/21 1157     Education Details educated on findings, plan of care, including education on benefits/risks of dry needling, initial HEP.  HEP texted to patient as well.  Access Code: [email or web address]    Person(s) Educated Patient    Methods Explanation;Demonstration;Handout;Verbal cues    Comprehension Verbalized understanding;Returned demonstration              PT Short Term Goals - 09/29/21 1216       PT SHORT TERM GOAL #1   Title Patient to be independent with initial HEP.    Time 1    Period Weeks    Status New    Target Date 10/06/21               PT Long Term Goals - 09/29/21 1216       PT LONG TERM GOAL #1   Title Patient to be independent with advanced HEP.    Time 3    Period Weeks    Status New    Target Date 10/20/21      PT LONG TERM GOAL #2   Title Pt will demonstrate 60 deg cervical Rotation bil for safety with driving.    Baseline deficits, see flow sheet    Time 3    Period Weeks    Status New    Target Date 10/20/21       PT LONG TERM GOAL #3   Title Patient will report 50% improvement in neck pain/stiffness    Time 3    Period Weeks    Status New    Target Date 10/20/21      PT LONG TERM GOAL #4   Title Pt. will report 50% improvement in neck/shoulder stiffness with overhead activities    Baseline reports stiffness/tightness with all cervical and shoulder movements.    Time 3    Period Weeks    Status New    Target Date 10/20/21                    Plan - 09/29/21 1158     Clinical Impression Statement Ruth Matthews is a 55 year old female referred for cervicalgia.  She has complicated recent medical history including recent PEs, she aggravated neck after fainting when the PEs occured on Aug 05, 2021.  She demonstrates foward head posture, decreased cervical ROM, trigger points throughout bil upper traps and levator scapulae.  Due to medical bills and meeting OOP deductible for year, would prefer shorter POC.  Today given extensive HEP for postural strengthening and stretches.  Discussed dry needling as she may be a good candidate and she is interested.  Kriste would benefit from skilled physical therapy to decrease pain and muscle spasm and improve QOL.    Personal Factors and Comorbidities Age;Time since onset of injury/illness/exacerbation;Comorbidity 3+    Comorbidities history of PE, DOE, fatigue, anxiety, fibromyalgia, DVT 2017, rocky mountain spotted feaver, HTN    Examination-Activity Limitations Bed Mobility;Sit;Reach Overhead;Lift;Carry    Examination-Participation Restrictions Occupation;Cleaning;Community Activity;Driving    Stability/Clinical Decision Making Evolving/Moderate complexity    Clinical Decision Making Moderate    Rehab Potential Excellent    PT Frequency --   2-3x/week   PT Duration 3 weeks    PT Treatment/Interventions ADLs/Self Care Home Management;Cryotherapy;Electrical Stimulation;Iontophoresis 4mg /ml Dexamethasone;Moist Heat;Traction;Ultrasound;Functional mobility  training;Therapeutic activities;Therapeutic exercise;Neuromuscular re-education;Patient/family education;Manual techniques;Taping;Dry needling;Passive range of motion;Spinal Manipulations;Joint Manipulations    PT Next Visit Plan review and progress HEP for postural strengthening, dry needling UT/LS, manual therapy and modalities PRN    PT Home Exercise Plan Access Code: [email or web address]    Consulted and Agree with Plan of Care Patient             Patient will benefit from skilled therapeutic intervention in order to improve the following deficits and impairments:  Hypomobility, Increased muscle spasms, Decreased mobility, Decreased range of motion, Improper body mechanics, Decreased activity tolerance, Increased fascial restricitons, Impaired flexibility, Impaired UE functional use, Postural dysfunction, Pain  Visit Diagnosis: Cervicalgia  Other muscle spasm     Problem List Patient Active Problem List   Diagnosis Date Noted   Shortness of breath 09/22/2021   Multiple subsegmental pulmonary emboli without acute cor pulmonale ([place]) 09/22/2021   Laryngopharyngeal reflux (LPR) 06/11/2020   Sensorineural hearing loss (SNHL) of left ear with unrestricted hearing of right ear 06/11/2020   Tinnitus, left 06/11/2020   Tonsillar calculus 06/11/2020   Gastroesophageal reflux disease 02/23/2020   NAFLD (nonalcoholic fatty liver disease) 08/30/2019   Arthralgia 04/29/2018   Migraines 08/06/2017   Superficial thrombophlebitis 04/03/2017   Anxiety and depression 10/27/2016   Insomnia 07/17/2016   History of DVT (deep vein thrombosis) 01/20/2016   Allergic rhinitis 12/25/2015   Obesity 12/25/2015   Sinus pressure 12/25/2015   Tachycardia 12/25/2015   Varicose veins of both legs with edema 12/25/2015   Vitamin D deficiency 12/25/2015   DVT (deep venous thrombosis) ([place]) 04/23/2015   Paroxysmal ventricular tachycardia 12/11/2013   Essential hypertension, benign 12/11/2013   Other and  unspecified hyperlipidemia 12/11/2013   Edema 12/11/2013   Other malaise and fatigue 12/11/2013    Rennie Natter, PT, DPT  09/29/2021, 5:57 PM  [facility] [address]  [place], [place], [address] Phone: [phone]   Fax:  [phone]  Name: Kristian Mixer MRN: [id number] Date of Birth: 06-09-1966

## 2021-09-29 NOTE — Patient Instructions (Signed)
Access Code: [email or web address] URL: [email or web address] Date: 09/29/2021 Prepared by: Harrie Foreman  Exercises Seated Cervical Retraction - 3 x daily - 7 x weekly - 1 sets - 10 reps - 5 hold Seated Shoulder Rolls - 3 x daily - 7 x weekly - 1 sets - 10 reps Seated Scapular Retraction - 3 x daily - 7 x weekly - 1 sets - 10 reps - 5sec hold Seated Levator Scapulae Stretch - 2 x daily - 7 x weekly - 1 sets - 3 reps - 10 sec hold Seated Upper Trapezius Stretch - 2 x daily - 7 x weekly - 1 sets - 3 reps - 10 sec hold Supine Chin Tuck - 1 x daily - 7 x weekly - 1 sets - 10 reps - 5 sec hold Sidelying Open Book Thoracic Lumbar Rotation and Extension - 1 x daily - 7 x weekly - 2 sets - 10 reps Supine Lower Trunk Rotation - 1 x daily - 7 x weekly - 2 sets - 10 reps Supine Posterior Pelvic Tilt - 1 x daily - 7 x weekly - 2 sets - 10 reps - 5 sec hold Corner Pec Major Stretch - 1 x daily - 7 x weekly - 1 sets - 3 reps - 30 sec hold Doorway Pec Stretch at 60 Degrees Abduction with Arm Straight - 1 x daily - 7 x weekly - 1 sets - 3 reps - 30 sec hold

## 2021-10-01 ENCOUNTER — Other Ambulatory Visit: Payer: Self-pay

## 2021-10-01 ENCOUNTER — Ambulatory Visit: Payer: BC Managed Care – PPO | Admitting: Physical Therapy

## 2021-10-01 DIAGNOSIS — M62838 Other muscle spasm: Secondary | ICD-10-CM

## 2021-10-01 DIAGNOSIS — M25651 Stiffness of right hip, not elsewhere classified: Secondary | ICD-10-CM

## 2021-10-01 DIAGNOSIS — M542 Cervicalgia: Secondary | ICD-10-CM | POA: Diagnosis not present

## 2021-10-01 DIAGNOSIS — R262 Difficulty in walking, not elsewhere classified: Secondary | ICD-10-CM

## 2021-10-01 DIAGNOSIS — R2689 Other abnormalities of gait and mobility: Secondary | ICD-10-CM

## 2021-10-01 DIAGNOSIS — M25551 Pain in right hip: Secondary | ICD-10-CM

## 2021-10-01 NOTE — Therapy (Signed)
[facility] [address]  [address] [place], [place], [address] Phone: [phone]   Fax:  [phone]  Physical Therapy Treatment  Patient Details  Name: Ruth Matthews MRN: [id number] Date of Birth: July 01, 1966 Referring Provider (PT): Macy Mis MD   Encounter Date: 10/01/2021   PT End of Session - 10/01/21 1104     Visit Number 2    Number of Visits 12    Date for PT Re-Evaluation 10/17/21    Authorization Type BCBS    PT Start Time 1103    PT Stop Time 1145    PT Time Calculation (min) 42 min    Activity Tolerance Patient tolerated treatment well    Behavior During Therapy [facility] for tasks assessed/performed             Past Medical History:  Diagnosis Date   Anxiety    DVT (deep venous thrombosis) (HCC)    Elevated fasting blood sugar    Gallstone    Gestational diabetes 2000   Hypertension    Left anterior fascicular block    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 Anne Fu, Dr. Graciela Husbands   Obesity    Ovarian cyst    ruptured   RMSF [facility] spotted fever) 2004   history of    Varicose veins    Ventricular tachycardia    Vitamin D deficiency     Past Surgical History:  Procedure Laterality Date   CARDIAC CATHETERIZATION     no CAD   CESAREAN SECTION     CHOLECYSTECTOMY     ENDOVENOUS ABLATION SAPHENOUS VEIN W/ LASER Bilateral 02/2011   Both legs a few weeks apart   LIVER BIOPSY      There were no vitals filed for this visit.   Subjective Assessment - 10/01/21 1104     Subjective Pt states she has been able to go through her exercises once. Pt states that she was fatigued yesterday so she wasn't able to do them.    Pertinent History history of PE, DOE, fatigue, anxiety, fibromyalgia, DVT 2017, rocky [place] spotted feaver, HTN    Limitations Reading;Writing;House hold activities    Diagnostic tests 11/23- MRI cervical There is a small central disc herniation at C4-5 that effaces the thecal sac  without compressing the cord.  Mild degenerative disc disease and mild disc bulge at C5-6.  Otherwise no significant abnormality.   MRA Angio Neck IMPRESSION: Normal    Patient Stated Goals be able to move better without pain    Currently in Pain? Yes    Pain Score 5     Pain Location Neck    Pain Orientation Left;Posterior    Pain Onset More than a month ago   Aug 05, 2021   Multiple Pain Sites Yes    Pain Score 5    Pain Location Back    Pain Orientation Right    Pain Descriptors / Indicators Aching    Pain Radiating Towards Hips                [facility] PT Assessment - 10/01/21 0001       Assessment   Medical Diagnosis M54.2 (ICD-10-CM) - Cervicalgia    Referring Provider (PT) Macy Mis MD    Onset Date/Surgical Date 08/05/21    Hand Dominance Right    Prior Therapy no  [place] Adult PT Treatment/Exercise - 10/01/21 0001       Neck Exercises: Machines for Strengthening   UBE (Upper Arm Bike) L1 x 3 min forward, 3 min backward      Neck Exercises: Supine   Neck Retraction 10 reps;3 secs    Capital Flexion 10 reps    Other Supine Exercise shoulder external rotation red tband 2x10; shoulder horizontal abd 2x10      Neck Exercises: Stretches   Upper Trapezius Stretch 30 seconds    Levator Stretch 30 seconds    Other Neck Stretches Doorway pec stretch low, middle, high x30 sec each      Lumbar Exercises: Stretches   Hip Flexor Stretch 30 seconds    Piriformis Stretch 30 seconds      Lumbar Exercises: Supine   Ab Set --    Pelvic Tilt 10 reps;5 seconds    Bridge Compliant;10 reps;3 seconds      Manual Therapy   Manual Therapy Soft tissue mobilization;Joint mobilization    Joint Mobilization Cervical side glides and PA mobs grade II to III    Soft tissue mobilization STM: UT, levator scapulae, cervical paraspinals                     PT Education - 10/01/21 1151     Education Details Discussed TPDN and  continuing manual stretch/massage at home. Reviewed HEP and progresed as needed.              PT Short Term Goals - 09/29/21 1216       PT SHORT TERM GOAL #1   Title Patient to be independent with initial HEP.    Time 1    Period Weeks    Status New    Target Date 10/06/21               PT Long Term Goals - 09/29/21 1216       PT LONG TERM GOAL #1   Title Patient to be independent with advanced HEP.    Time 3    Period Weeks    Status New    Target Date 10/20/21      PT LONG TERM GOAL #2   Title Pt will demonstrate 60 deg cervical Rotation bil for safety with driving.    Baseline deficits, see flow sheet    Time 3    Period Weeks    Status New    Target Date 10/20/21      PT LONG TERM GOAL #3   Title Patient will report 50% improvement in neck pain/stiffness    Time 3    Period Weeks    Status New    Target Date 10/20/21      PT LONG TERM GOAL #4   Title Pt. will report 50% improvement in neck/shoulder stiffness with overhead activities    Baseline reports stiffness/tightness with all cervical and shoulder movements.    Time 3    Period Weeks    Status New    Target Date 10/20/21                   Plan - 10/01/21 1125     Clinical Impression Statement Treatment session focused on manual therapy and stretching to address posterior neck and LE stiffness. Worked on initiating strengthening for UE and LE postural stabilization. Reviewed HEP with good pt understanding. Continued discussions about TPDN.    Personal Factors and Comorbidities Age;Time since onset of injury/illness/exacerbation;Comorbidity 3+  Comorbidities history of PE, DOE, fatigue, anxiety, fibromyalgia, DVT 2017, rocky mountain spotted feaver, HTN    Examination-Activity Limitations Bed Mobility;Sit;Reach Overhead;Lift;Carry    Examination-Participation Restrictions Occupation;Cleaning;Community Activity;Driving    Stability/Clinical Decision Making Evolving/Moderate  complexity    Rehab Potential Excellent    PT Frequency --   2-3x/week   PT Duration 3 weeks    PT Treatment/Interventions ADLs/Self Care Home Management;Cryotherapy;Electrical Stimulation;Iontophoresis 4mg /ml Dexamethasone;Moist Heat;Traction;Ultrasound;Functional mobility training;Therapeutic activities;Therapeutic exercise;Neuromuscular re-education;Patient/family education;Manual techniques;Taping;Dry needling;Passive range of motion;Spinal Manipulations;Joint Manipulations    PT Next Visit Plan review and progress HEP for postural strengthening, dry needling UT/LS, manual therapy and modalities PRN. Trial theracane for home use.    PT Home Exercise Plan Access Code: [email or web address]    Consulted and Agree with Plan of Care Patient             Patient will benefit from skilled therapeutic intervention in order to improve the following deficits and impairments:  Hypomobility, Increased muscle spasms, Decreased mobility, Decreased range of motion, Improper body mechanics, Decreased activity tolerance, Increased fascial restricitons, Impaired flexibility, Impaired UE functional use, Postural dysfunction, Pain  Visit Diagnosis: Cervicalgia  Other muscle spasm  Pain in right hip  Stiffness of right hip, not elsewhere classified  Difficulty in walking, not elsewhere classified  Other abnormalities of gait and mobility     Problem List Patient Active Problem List   Diagnosis Date Noted   Shortness of breath 09/22/2021   Multiple subsegmental pulmonary emboli without acute cor pulmonale (HCC) 09/22/2021   Laryngopharyngeal reflux (LPR) 06/11/2020   Sensorineural hearing loss (SNHL) of left ear with unrestricted hearing of right ear 06/11/2020   Tinnitus, left 06/11/2020   Tonsillar calculus 06/11/2020   Gastroesophageal reflux disease 02/23/2020   NAFLD (nonalcoholic fatty liver disease) 08/30/2019   Arthralgia 04/29/2018   Migraines 08/06/2017   Superficial thrombophlebitis  04/03/2017   Anxiety and depression 10/27/2016   Insomnia 07/17/2016   History of DVT (deep vein thrombosis) 01/20/2016   Allergic rhinitis 12/25/2015   Obesity 12/25/2015   Sinus pressure 12/25/2015   Tachycardia 12/25/2015   Varicose veins of both legs with edema 12/25/2015   Vitamin D deficiency 12/25/2015   DVT (deep venous thrombosis) ([place]) 04/23/2015   Paroxysmal ventricular tachycardia 12/11/2013   Essential hypertension, benign 12/11/2013   Other and unspecified hyperlipidemia 12/11/2013   Edema 12/11/2013   Other malaise and fatigue 12/11/2013    [facility] April Gordy Levan, PT, DPT 10/01/2021, 11:52 AM  [facility] [address]  [place], [place], [address] Phone: [phone]   Fax:  [phone]  Name: Ruth Matthews MRN: [id number] Date of Birth: 22-Jul-1966

## 2021-10-03 ENCOUNTER — Ambulatory Visit (INDEPENDENT_AMBULATORY_CARE_PROVIDER_SITE_OTHER): Payer: BC Managed Care – PPO

## 2021-10-03 ENCOUNTER — Ambulatory Visit (INDEPENDENT_AMBULATORY_CARE_PROVIDER_SITE_OTHER): Payer: BC Managed Care – PPO | Admitting: Family

## 2021-10-03 ENCOUNTER — Other Ambulatory Visit: Payer: Self-pay

## 2021-10-03 VITALS — BP 116/76 | HR 73 | Ht 66.0 in | Wt 242.5 lb

## 2021-10-03 DIAGNOSIS — R42 Dizziness and giddiness: Secondary | ICD-10-CM

## 2021-10-03 DIAGNOSIS — I4729 Other ventricular tachycardia: Secondary | ICD-10-CM

## 2021-10-03 DIAGNOSIS — R0609 Other forms of dyspnea: Secondary | ICD-10-CM

## 2021-10-03 DIAGNOSIS — R002 Palpitations: Secondary | ICD-10-CM | POA: Diagnosis not present

## 2021-10-03 NOTE — Progress Notes (Signed)
Office Visit    Patient Name: Ruth Matthews Date of Encounter: 10/03/2021  PCP:  Katherina Mires, MD   [place]  Cardiologist:  Freada Bergeron, MD  Advanced Practice Provider:  No care team member to display Electrophysiologist:  None      Chief Complaint    Ruth Matthews is a 55 y.o. female with a hx of HTN, fibromyalgia, paroxysmal VT, DVT (2012), HLD, PE presents today for follow-up after diagnosis of PE  Past Medical History    Past Medical History:  Diagnosis Date   Anxiety    DVT (deep venous thrombosis) (HCC)    Elevated fasting blood sugar    Gallstone    Gestational diabetes 2000   Hypertension    Left anterior fascicular block    idiopathic 1/12-Dr. Marlou Porch, Dr. Caryl Comes   Obesity    Ovarian cyst    ruptured   RMSF [facility] spotted fever) 2004   history of    Varicose veins    Ventricular tachycardia    Vitamin D deficiency    Past Surgical History:  Procedure Laterality Date   CARDIAC CATHETERIZATION     no CAD   CESAREAN SECTION     CHOLECYSTECTOMY     ENDOVENOUS ABLATION SAPHENOUS VEIN W/ LASER Bilateral 02/2011   Both legs a few weeks apart   LIVER BIOPSY      Allergies  Allergies  Allergen Reactions   Latex Itching, Swelling and Rash   Azithromycin     Upset stomach    Garlic Swelling    Tongue swelling and sensation of throat swelling Tongue swelling and sensation of throat swelling    Onion Swelling    Tongue swelling and sensation of throat swelling Tongue swelling and sensation of throat swelling    Other     "whitening toothpaste"  "earring metals," "nickels and metals"    Levaquin [Levofloxacin]     History of Present Illness    Ruth Matthews is a 55 y.o. female with a hx of HTN, fibromyalgia, paroxysmal VT, DVT (2012), HLD, PE last seen 07/29/2021.  She was initially evaluated in 2012 for palpitations and exertional presyncope.  She was diagnosed with  wide-complex tachycardia with negative fluid affecting QRS complexes and lateral leads.  She also had right axis deviation negative deflections in leads II, 3, aVF.  She was given IV amiodarone and converted after about 6 minutes.  She did brief episode of atrial fibrillation postconversion but remained in sinus rhythm.  She was recommended for verapamil for treatment of bifascicular VT.  Cardiac MRI and echocardiogram were unremarkable.  She underwent cardiac catheterization showing no coronary disease. Updated echocardiogram 01/06/2021 due to dyspnea with normal biventricular function and no significant valvular disease.    She was last seen 07/29/2021 feeling overall well.  She was planning to receive second dose of COVID-vaccine at her allergist office as she had had previous reaction.  She notes that after she had the vaccination she felt nauseous but otherwise well.  However shortly thereafter she traveled to [place] to visit friends had a syncopal episode where she was found by her friend's dog on the floor and was transported to hospital with finding  of bilateral PE. She has since been recommended for lifelong anticoagulation by hematology given prior DVT.  She presents today for follow up. Echocardiogram report from [facility] pulled up on her phone with LVEF 65%, gr1DD, no RWMA, normal atria, RV  normal size and function, trace MR, mild TR. Reviewed with her, reassurance provided. 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 Saturday this week she was walking and felt a "zinger" type panin which resolved after a few seconds with rest. This has occurred on occasion since discharge. Presently working with PT for a neck injury but no other formal exercise routine to increase stamina. She had lightheadedness and dizziness initially after PE which is less than previous but still present. No near syncope nor syncope. Does not recall prodromal symptoms  prior to syncopal episode. Denies palpitations, chest pain.    EKGs/Labs/Other Studies Reviewed:   The following studies were reviewed today:  Echo 07/2021 at [facility] LVEF [id number], Grade 1 diastolic dysfunction No wall motion abnormalities Bilateral atria normal size RV normal size and function. PASP 49mmHg Trace MR Mild TR  Echocardiogram 01/06/2021 IMPRESSIONS   1. Left ventricular ejection fraction, by estimation, is 60 to 65%. Left  ventricular ejection fraction by 3D volume is 60 %. The left ventricle has  normal function. The left ventricle has no regional wall motion  abnormalities. Left ventricular diastolic   parameters were normal.   2. Right ventricular systolic function is normal. The right ventricular  size is normal. There is normal pulmonary artery systolic pressure.   3. The mitral valve is normal in structure. No evidence of mitral valve  regurgitation. No evidence of mitral stenosis.   4. The aortic valve is normal in structure. Aortic valve regurgitation is  not visualized. No aortic stenosis is present.   5. The inferior vena cava is normal in size with greater than 50%  respiratory variability, suggesting right atrial pressure of 3 mmHg.    Cardiac MRI Findings:  All 4 cardiac chambers were normal in size and function.  There was no ASD, VSD or pericardial effusion.  Mitral, aortic and  tricuspid valves appeared morphologically normal.  Quantitative EF  was 61% ( EDV-119cc, ESV-47cc, SV 72cc) Hyperenhancement images  showed no infiltration or scar tissue in the LV     Impression:        1)    Normal cardiac MRI          2)    EF 61% with no RWMA's  3)    No infiltration or hyperenhancement in LV   EKG: No EKG today  Recent Labs: No results found for requested labs within last 8760 hours.  Recent Lipid Panel    Component Value Date/Time   CHOL 190 07/24/2020 0912   TRIG 132 07/24/2020 0912   HDL 76 07/24/2020 0912   CHOLHDL 2.5 07/24/2020  0912   CHOLHDL 3.8 10/29/2010 1019   VLDL 24 10/29/2010 1019   LDLCALC 91 07/24/2020 0912     Home Medications   Current Meds  Medication Sig   albuterol (VENTOLIN HFA) 108 (90 Base) MCG/ACT inhaler Inhale 2 puffs into the lungs every 6 (six) hours as needed for wheezing or shortness of breath.   ELIQUIS 5 MG TABS tablet Take 5 mg by mouth 2 (two) times daily.   EPINEPHrine 0.3 mg/0.3 mL IJ SOAJ injection    fexofenadine (ALLEGRA) 180 MG tablet Take 4 tablets by mouth daily.   fluticasone (FLONASE) 50 MCG/ACT nasal spray Place 1 spray into both nostrils daily.   Multiple Vitamin (MULTIVITAMIN ADULT PO) Take by mouth.   omeprazole (PRILOSEC) 20 MG capsule Take 20 mg by mouth daily.   Propylene Glycol (  SYSTANE BALANCE) 0.6 % SOLN Place 1 drop into both eyes daily as needed (dry eyes).   verapamil (CALAN-SR) 240 MG CR tablet Take 1 tablet (240 mg total) by mouth daily.     Review of Systems      All other systems reviewed and are otherwise negative except as noted above.  Physical Exam    VS:  BP 116/76 (BP Location: Left Arm, Patient Position: Sitting, Cuff Size: Large)    Pulse 73    Ht 5\' 6"  (1.676 m)    Wt 242 lb 8 oz (110 kg)    SpO2 98%    BMI 39.14 kg/m  , BMI Body mass index is 39.14 kg/m.  Wt Readings from Last 3 Encounters:  10/03/21 242 lb 8 oz (110 kg)  09/22/21 241 lb 3.2 oz (109.4 kg)  07/29/21 235 lb 12.8 oz (107 kg)     GEN: Well nourished, well developed, in no acute distress. HEENT: normal. Neck: Supple, no JVD, carotid bruits, or masses. Cardiac: RRR, no murmurs, rubs, or gallops. No clubbing, cyanosis, edema.  Radials/PT 2+ and equal bilaterally.  Respiratory:  Respirations regular and unlabored, clear to auscultation bilaterally. GI: Soft, nontender, nondistended. MS: No deformity or atrophy. Skin: Warm and dry, no rash. Neuro:  Strength and sensation are intact. Psych: Normal affect.  Assessment & Plan    Paroxysmal VT / Lightheadedness /  Dizziness -previous VT episode 2012. Has had lightheadednes,s dizziness since PE diagnosis. 14 day ZIO placed in clinic to rule out new arrhythmia such as SVT as contributory.   DOE - Likely multifactorial recent PE, deconditioning. Shares with me that this is overall improving. Echo 07/2021 with normal LVEF, no significant valvular abnormalities. No indication to repeat. CTPA per PCP 09/10/21 no acute abnormalities. Anticipate more of her exertional dyspnea is related to deconditioning. If dyspnea persists at follow up could consider cardiac CTA however no chest pain, pressure. Will request records from [facility] as CTA of chest  would provide info on whether coronary atherosclerosis is present and as multiple CT would prefer to avoid recurrent contrast exposure.   Obesity - Weight loss via diet and exercise encouraged. Discussed the impact being overweight would have on cardiovascular risk. Referred to PREP exercise program at the [facility] today.  Disposition: Follow up in 4 month(s) with [facility], MD or APP.  Signed, Meriam Sprague, NP 10/03/2021, 3:10 PM [facility]

## 2021-10-03 NOTE — Patient Instructions (Addendum)
Medication Instructions:  No medication changes today.   *If you need a refill on your cardiac medications before your next appointment, please call your pharmacy*   Lab Work: None ordered today.    Testing/Procedures: Your physician has recommended that you wear a Zio monitor.   This monitor is a medical device that records the hearts electrical activity. Doctors most often use these monitors to diagnose arrhythmias. Arrhythmias are problems with the speed or rhythm of the heartbeat. The monitor is a small device applied to your chest. You can wear one while you do your normal daily activities. While wearing this monitor if you have any symptoms to push the button and record what you felt. Once you have worn this monitor for the period of time provider prescribed (Usually 14 days), you will return the monitor device in the postage paid box. Once it is returned they will download the data collected and provide [name] with a report which the provider will then review and we will call you with those results. Important tips:  Avoid showering during the first 24 hours of wearing the monitor. Avoid excessive sweating to help maximize wear time. Do not submerge the device, no hot tubs, and no swimming pools. Keep any lotions or oils away from the patch. After 24 hours you may shower with the patch on. Take brief showers with your back facing the shower head.  Do not remove patch once it has been placed because that will interrupt data and decrease adhesive wear time. Push the button when you have any symptoms and write down what you were feeling. Once you have completed wearing your monitor, remove and place into box which has postage paid and place in your outgoing mailbox.  If for some reason you have misplaced your box then call our office and we can provide another box and/or mail it off for you.      Follow-Up: At [facility], you and your health needs are our priority.  As part of our  continuing mission to provide you with exceptional heart care, we have created designated Provider Care Teams.  These Care Teams include your primary Cardiologist (physician) and Advanced Practice Providers (APPs -  Physician Assistants and Nurse Practitioners) who all work together to provide you with the care you need, when you need it.  We recommend signing up for the patient portal called "MyChart".  Sign up information is provided on this After Visit Summary.  MyChart is used to connect with patients for Virtual Visits (Telemedicine).  Patients are able to view lab/test results, encounter notes, upcoming appointments, etc.  Non-urgent messages can be sent to your provider as well.   To learn more about what you can do with MyChart, go to [email or web address].    Your next appointment:   In April as scheduled with Dr. Johney Frame   Other Instructions  Heart Healthy Diet Recommendations: A low-salt diet is recommended. Meats should be grilled, baked, or boiled. Avoid fried foods. Focus on lean protein sources like fish or chicken with vegetables and fruits. The American Heart Association is a Microbiologist!  American Heart Association Diet and Lifeystyle Recommendations    Exercise recommendations: The American Heart Association recommends 150 minutes of moderate intensity exercise weekly. Try 30 minutes of moderate intensity exercise 4-5 times per week. This could include walking, jogging, or swimming.            Provider Referral Exercise Program (P.R.E.P.)      12 Weeks to [employer]  What is included in the PREP?  --Health Coaching and personalized exercise prescription --Full Membership to participating [facility] for the 12 weeks --Pre- and Post-consultations to assess progress and formulate an exercise plan for       continuation of exercise   What is my investment?  Your cost is $100 (reg. $144). This includes full membership privileges to [facility] in [place] and across  the country. If you complete the post-assessment visit, any applicable enrollment costs will be waived if you decide to join the [facility].   Who will benefit from PREP?  People with challenges, including (but not limited to): ---Low back pain ---Arthritis ---Hypertension ---Diabetes ---Obesity ---Joint Replacement ---Neuromuscular Disorders ---Cancer Recovery ---Weight Loss ---Many others (as determined by provider)   Get Started Today! Ask your healthcare provider to fill out a Healthcare Provider Referral for Exercise form. Be sure to turn it in before you leave the office and send/fax/email it directly to the Wellness RN to schedule your Initial Consultation. If you have family or friends that would also benefit, please share this referral with them.   Contacts:  [facility] [phone]    Viann Fish, Wellness RN  [phone]   [email or web address]

## 2021-10-04 ENCOUNTER — Encounter (HOSPITAL_BASED_OUTPATIENT_CLINIC_OR_DEPARTMENT_OTHER): Payer: Self-pay | Admitting: Family

## 2021-10-08 ENCOUNTER — Ambulatory Visit: Payer: BC Managed Care – PPO | Admitting: Physical Therapy

## 2021-10-08 ENCOUNTER — Other Ambulatory Visit: Payer: Self-pay

## 2021-10-08 DIAGNOSIS — M542 Cervicalgia: Secondary | ICD-10-CM

## 2021-10-08 DIAGNOSIS — R2689 Other abnormalities of gait and mobility: Secondary | ICD-10-CM

## 2021-10-08 DIAGNOSIS — M62838 Other muscle spasm: Secondary | ICD-10-CM

## 2021-10-08 NOTE — Therapy (Signed)
[facility] [address]  [address] [place], [place], [address] Phone: [phone]   Fax:  [phone]  Physical Therapy Treatment  Patient Details  Name: Ruth Matthews MRN: [id number] Date of Birth: 11/24/1965 Referring Provider (PT): Macy Mis MD   Encounter Date: 10/08/2021   PT End of Session - 10/08/21 0932     Visit Number 3    Number of Visits 12    Date for PT Re-Evaluation 10/17/21    Authorization Type BCBS    PT Start Time 0933    PT Stop Time 1015    PT Time Calculation (min) 42 min    Activity Tolerance Patient tolerated treatment well    Behavior During Therapy [facility] for tasks assessed/performed             Past Medical History:  Diagnosis Date   Anxiety    DVT (deep venous thrombosis) (HCC)    Elevated fasting blood sugar    Gallstone    Gestational diabetes 2000   Hypertension    Left anterior fascicular block    idiopathic 1/12-Dr. Anne Fu, Dr. Graciela Husbands   Obesity    Ovarian cyst    ruptured   RMSF [facility] spotted fever) 2004   history of    Varicose veins    Ventricular tachycardia    Vitamin D deficiency     Past Surgical History:  Procedure Laterality Date   CARDIAC CATHETERIZATION     no CAD   CESAREAN SECTION     CHOLECYSTECTOMY     ENDOVENOUS ABLATION SAPHENOUS VEIN W/ LASER Bilateral 02/2011   Both legs a few weeks apart   LIVER BIOPSY      There were no vitals filed for this visit.   Subjective Assessment - 10/08/21 0935     Subjective Pt states she is trying to be more mindful of her posture. Pt has been doing her exercises. Pt states all the exercises feel good after doing them.    Pertinent History history of PE, DOE, fatigue, anxiety, fibromyalgia, DVT 2017, [place] spotted feaver, HTN    Limitations Reading;Writing;House hold activities    Diagnostic tests 11/23- MRI cervical There is a small central disc herniation at C4-5 that effaces the  thecal sac without compressing the cord.  Mild degenerative disc disease and mild disc bulge at C5-6.  Otherwise no significant abnormality.   MRA Angio Neck IMPRESSION: Normal    Patient Stated Goals be able to move better without pain    Currently in Pain? Yes    Pain Score 5     Pain Location Neck    Pain Orientation Left;Posterior    Pain Descriptors / Indicators Tightness    Pain Type Chronic pain    Pain Onset More than a month ago   Aug 05, 2021               [facility] PT Assessment - 10/08/21 0001       Assessment   Medical Diagnosis M54.2 (ICD-10-CM) - Cervicalgia    Referring Provider (PT) Macy Mis MD    Onset Date/Surgical Date 08/05/21                           [facility] Adult PT Treatment/Exercise - 10/08/21 0001       Self-Care   Self-Care Other Self-Care Comments    Other Self-Care Comments  Discussed using theracane, tennis ball  or foam roll to keep on managing tightness at home. Pt able to provide demonstration      Exercises   Exercises Shoulder      Neck Exercises: Machines for Strengthening   UBE (Upper Arm Bike) L1 x 3 min forward, 3 min backward      Neck Exercises: Standing   Other Standing Exercises Cervical retraction with shoulder ER red tband 2x10    Other Standing Exercises Cervical retraction + shoulder horizontal abduction red tband 2x10      Neck Exercises: Stretches   Upper Trapezius Stretch 30 seconds    Levator Stretch 30 seconds    Other Neck Stretches Open/close book standing x10      Shoulder Exercises: Standing   Extension Strengthening;Both;20 reps;Theraband    Theraband Level (Shoulder Extension) Level 2 (Red)    Row Strengthening;Both;20 reps;Theraband    Theraband Level (Shoulder Row) Level 2 (Red)      Manual Therapy   Manual Therapy Taping   K tape to inhibit bilat UTs   Manual therapy comments Pt self massage and trigger point release of mid back and neck with theracane, tennis ball and foam roll     Joint Mobilization pt self thoracic mobilizatioin via foam roll    Soft tissue mobilization STM: UT, levator scapulae, cervical paraspinals                       PT Short Term Goals - 09/29/21 1216       PT SHORT TERM GOAL #1   Title Patient to be independent with initial HEP.    Time 1    Period Weeks    Status New    Target Date 10/06/21               PT Long Term Goals - 09/29/21 1216       PT LONG TERM GOAL #1   Title Patient to be independent with advanced HEP.    Time 3    Period Weeks    Status New    Target Date 10/20/21      PT LONG TERM GOAL #2   Title Pt will demonstrate 60 deg cervical Rotation bil for safety with driving.    Baseline deficits, see flow sheet    Time 3    Period Weeks    Status New    Target Date 10/20/21      PT LONG TERM GOAL #3   Title Patient will report 50% improvement in neck pain/stiffness    Time 3    Period Weeks    Status New    Target Date 10/20/21      PT LONG TERM GOAL #4   Title Pt. will report 50% improvement in neck/shoulder stiffness with overhead activities    Baseline reports stiffness/tightness with all cervical and shoulder movements.    Time 3    Period Weeks    Status New    Target Date 10/20/21                   Plan - 10/08/21 0959     Clinical Impression Statement Continued upper trap and levator tightness R>L. Discussed methods on how to keep working on this at home via foam roller, tennis ball, and theracane. Provided manual therapy. Focused on postural stabilization this session in standing with good pt response.    Personal Factors and Comorbidities Age;Time since onset of injury/illness/exacerbation;Comorbidity 3+    Comorbidities history of  PE, DOE, fatigue, anxiety, fibromyalgia, DVT 2017, [place] spotted feaver, HTN    Examination-Activity Limitations Bed Mobility;Sit;Reach Overhead;Lift;Carry    Examination-Participation Restrictions Occupation;Cleaning;Community  Activity;Driving    Stability/Clinical Decision Making Evolving/Moderate complexity    Rehab Potential Excellent    PT Frequency --   2-3x/week   PT Duration 3 weeks    PT Treatment/Interventions ADLs/Self Care Home Management;Cryotherapy;Electrical Stimulation;Iontophoresis 4mg /ml Dexamethasone;Moist Heat;Traction;Ultrasound;Functional mobility training;Therapeutic activities;Therapeutic exercise;Neuromuscular re-education;Patient/family education;Manual techniques;Taping;Dry needling;Passive range of motion;Spinal Manipulations;Joint Manipulations    PT Next Visit Plan review and progress HEP for postural strengthening, dry needling UT/LS, manual therapy and modalities PRN. Trial theracane for home use.    PT Home Exercise Plan Access Code: [email or web address]    Consulted and Agree with Plan of Care Patient             Patient will benefit from skilled therapeutic intervention in order to improve the following deficits and impairments:  Hypomobility, Increased muscle spasms, Decreased mobility, Decreased range of motion, Improper body mechanics, Decreased activity tolerance, Increased fascial restricitons, Impaired flexibility, Impaired UE functional use, Postural dysfunction, Pain  Visit Diagnosis: Cervicalgia  Other muscle spasm  Other abnormalities of gait and mobility     Problem List Patient Active Problem List   Diagnosis Date Noted   Shortness of breath 09/22/2021   Multiple subsegmental pulmonary emboli without acute cor pulmonale (HCC) 09/22/2021   Laryngopharyngeal reflux (LPR) 06/11/2020   Sensorineural hearing loss (SNHL) of left ear with unrestricted hearing of right ear 06/11/2020   Tinnitus, left 06/11/2020   Tonsillar calculus 06/11/2020   Gastroesophageal reflux disease 02/23/2020   NAFLD (nonalcoholic fatty liver disease) 08/30/2019   Arthralgia 04/29/2018   Migraines 08/06/2017   Superficial thrombophlebitis 04/03/2017   Anxiety and depression 10/27/2016    Insomnia 07/17/2016   History of DVT (deep vein thrombosis) 01/20/2016   Allergic rhinitis 12/25/2015   Obesity 12/25/2015   Sinus pressure 12/25/2015   Tachycardia 12/25/2015   Varicose veins of both legs with edema 12/25/2015   Vitamin D deficiency 12/25/2015   DVT (deep venous thrombosis) ([place]) 04/23/2015   Paroxysmal ventricular tachycardia 12/11/2013   Essential hypertension, benign 12/11/2013   Other and unspecified hyperlipidemia 12/11/2013   Edema 12/11/2013   Other malaise and fatigue 12/11/2013    [facility] April Gordy Levan, PT, DPT 10/08/2021, 10:21 AM  [facility] [address]  [place], [place], [address] Phone: [phone]   Fax:  [phone]  Name: Ileane Berezin MRN: [id number] Date of Birth: Jul 21, 1966

## 2021-10-10 ENCOUNTER — Other Ambulatory Visit: Payer: Self-pay

## 2021-10-10 ENCOUNTER — Encounter: Payer: Self-pay | Admitting: Physical Therapy

## 2021-10-10 ENCOUNTER — Ambulatory Visit: Payer: BC Managed Care – PPO | Admitting: Physical Therapy

## 2021-10-10 DIAGNOSIS — M542 Cervicalgia: Secondary | ICD-10-CM

## 2021-10-10 DIAGNOSIS — R2689 Other abnormalities of gait and mobility: Secondary | ICD-10-CM

## 2021-10-10 DIAGNOSIS — M62838 Other muscle spasm: Secondary | ICD-10-CM

## 2021-10-10 NOTE — Therapy (Signed)
[facility] [place] [address]  [address] [place], [place], [address] Phone: [phone]   Fax:  [phone]  Physical Therapy Treatment  Patient Details  Name: Ruth Drapeden Ruth Matthews MRN: [id number] Date of Birth: 08-15-1966 Referring Provider (PT): Macy MisBriscoe, Kim K MD   Encounter Date: 10/10/2021   PT End of Session - 10/10/21 1024     Visit Number 4    Number of Visits 12    Date for PT Re-Evaluation 10/17/21    Authorization Type BCBS    PT Start Time [phone]    PT Stop Time 1025    PT Time Calculation (min) 54 min    Activity Tolerance Patient tolerated treatment well    Behavior During Therapy [facility] for tasks assessed/performed             Past Medical History:  Diagnosis Date   Anxiety    DVT (deep venous thrombosis) (HCC)    Elevated fasting blood sugar    Gallstone    Gestational diabetes 2000   Hypertension    Left anterior fascicular block    idiopathic 1/12-Dr. Anne FuSkains, Dr. Graciela HusbandsKlein   Obesity    Ovarian cyst    ruptured   RMSF [facility]([place] spotted fever) 2004   history of    Varicose veins    Ventricular tachycardia    Vitamin D deficiency     Past Surgical History:  Procedure Laterality Date   CARDIAC CATHETERIZATION     no CAD   CESAREAN SECTION     CHOLECYSTECTOMY     ENDOVENOUS ABLATION SAPHENOUS VEIN W/ LASER Bilateral 02/2011   Both legs a few weeks apart   LIVER BIOPSY      There were no vitals filed for this visit.   Subjective Assessment - 10/10/21 0931     Subjective Pt. reports she is doing well.  Neck pain is quite a bit better.  Still a lot of tightness between her shoulder blades.    Pertinent History history of PE, DOE, fatigue, anxiety, fibromyalgia, DVT 2017, [place] spotted feaver, HTN    Limitations Reading;Writing;House hold activities    Diagnostic tests 11/23- MRI cervical There is a small central disc herniation at C4-5 that effaces the thecal sac without compressing  the cord.  Mild degenerative disc disease and mild disc bulge at C5-6.  Otherwise no significant abnormality.   MRA Angio Neck IMPRESSION: Normal    Patient Stated Goals be able to move better without pain    Currently in Pain? Yes    Pain Score 3     Pain Location Neck    Pain Orientation Right    Pain Descriptors / Indicators Tightness    Pain Type Chronic pain    Pain Onset More than a month ago   Aug 05, 2021                              [facility] - 10/10/21 0001       Exercises   Exercises Shoulder      Neck Exercises: Supine   Other Supine Exercise self-mobs with foam roller to throracic spine      Neck Exercises: Prone   Other Prone Exercise cat cows x10, child pose, cobra pose    Other Prone Exercise sun salutations x 2, bird dogs x 10      Modalities   Modalities Moist Heat  Moist Heat Therapy   Number Minutes Moist Heat 10 Minutes    Moist Heat Location Cervical      Manual Therapy   Manual Therapy Soft tissue mobilization;Joint mobilization    Manual therapy comments to thoracic spine to decrease tightness    Joint Mobilization PA mobs to thoracic spine grade 3-4    Soft tissue mobilization IASTM with foam roller to thoracic and lumbar paraspinals, bil glutes and hamstrings                     PT Education - 10/10/21 1024     Education Details HEP progression, education on sitting posture and ergonomics.  Access Code: [email or web address]              PT Short Term Goals - 10/10/21 1223       PT SHORT TERM GOAL #1   Title Patient to be independent with initial HEP.    Time 1    Period Weeks    Status Achieved    Target Date 10/06/21               PT Long Term Goals - 10/10/21 1223       PT LONG TERM GOAL #1   Title Patient to be independent with advanced HEP.    Time 3    Period Weeks    Status On-going   10/10/21- updated.   Target Date 10/20/21      PT LONG TERM GOAL #2   Title Pt  will demonstrate 60 deg cervical Rotation bil for safety with driving.    Baseline deficits, see flow sheet    Time 3    Period Weeks    Status On-going    Target Date 10/20/21      PT LONG TERM GOAL #3   Title Patient will report 50% improvement in neck pain/stiffness    Time 3    Period Weeks    Status On-going    Target Date 10/20/21      PT LONG TERM GOAL #4   Title Pt. will report 50% improvement in neck/shoulder stiffness with overhead activities    Baseline reports stiffness/tightness with all cervical and shoulder movements.    Time 3    Period Weeks    Status On-going    Target Date 10/20/21                   Plan - 10/10/21 1027     Clinical Impression Statement Continued to progress exercises for core strengthening and stretching focusing on thoracic spine, including yoga stretches/poses and self-mobs.  Ruth Matthews participated well and reported decreased tightness with exercises, encouraged to start incorporating yoga into daily life.  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  Finished session with manual therapy to thoracic spine and MHP for muscle relaxation.    Personal Factors and Comorbidities Age;Time since onset of injury/illness/exacerbation;Comorbidity 3+    Comorbidities history of PE, DOE, fatigue, anxiety, fibromyalgia, DVT 2017, [place] spotted feaver, HTN    Examination-Activity Limitations Bed Mobility;Sit;Reach Overhead;Lift;Carry    Examination-Participation Restrictions Occupation;Cleaning;Community Activity;Driving    Stability/Clinical Decision Making Evolving/Moderate complexity    Rehab Potential Excellent    PT Frequency --   2-3x/week   PT Duration 3 weeks    PT Treatment/Interventions ADLs/Self Care Home Management;Cryotherapy;Electrical Stimulation;Iontophoresis 4mg /ml Dexamethasone;Moist Heat;Traction;Ultrasound;Functional  mobility training;Therapeutic activities;Therapeutic exercise;Neuromuscular re-education;Patient/family education;Manual techniques;Taping;Dry needling;Passive range  of motion;Spinal Manipulations;Joint Manipulations    PT Next Visit Plan review and progress HEP for postural strengthening, dry needling UT/LS, manual therapy and modalities PRN. Trial theracane for home use.    PT Home Exercise Plan Access Code: [email or web address]    Consulted and Agree with Plan of Care Patient             Patient will benefit from skilled therapeutic intervention in order to improve the following deficits and impairments:  Hypomobility, Increased muscle spasms, Decreased mobility, Decreased range of motion, Improper body mechanics, Decreased activity tolerance, Increased fascial restricitons, Impaired flexibility, Impaired UE functional use, Postural dysfunction, Pain  Visit Diagnosis: Cervicalgia  Other muscle spasm  Other abnormalities of gait and mobility     Problem List Patient Active Problem List   Diagnosis Date Noted   Shortness of breath 09/22/2021   Multiple subsegmental pulmonary emboli without acute cor pulmonale (HCC) 09/22/2021   Laryngopharyngeal reflux (LPR) 06/11/2020   Sensorineural hearing loss (SNHL) of left ear with unrestricted hearing of right ear 06/11/2020   Tinnitus, left 06/11/2020   Tonsillar calculus 06/11/2020   Gastroesophageal reflux disease 02/23/2020   NAFLD (nonalcoholic fatty liver disease) 08/30/2019   Arthralgia 04/29/2018   Migraines 08/06/2017   Superficial thrombophlebitis 04/03/2017   Anxiety and depression 10/27/2016   Insomnia 07/17/2016   History of DVT (deep vein thrombosis) 01/20/2016   Allergic rhinitis 12/25/2015   Obesity 12/25/2015   Sinus pressure 12/25/2015   Tachycardia 12/25/2015   Varicose veins of both legs with edema 12/25/2015   Vitamin D deficiency 12/25/2015   DVT (deep venous thrombosis) ([place]) 04/23/2015   Paroxysmal ventricular  tachycardia 12/11/2013   Essential hypertension, benign 12/11/2013   Other and unspecified hyperlipidemia 12/11/2013   Edema 12/11/2013   Other malaise and fatigue 12/11/2013    Rennie Natter, PT, DPT  10/10/2021, 12:25 PM  [facility] [place] [address]  [place], [place], [address] Phone: [phone]   Fax:  [phone]  Name: Ruth Matthews MRN: [id number] Date of Birth: 10-05-1966

## 2021-10-10 NOTE — Patient Instructions (Signed)
Access Code: [email or web address] URL: [email or web address] Date: 10/10/2021 Prepared by: Harrie Foreman  Exercises Bird Dog - 1 x daily - 7 x weekly - 2 sets - 10 reps Cobra - 1 x daily - 7 x weekly - 1 sets - 10 reps Cat Cow - 1 x daily - 7 x weekly - 2 sets - 10 reps  Patient Education Office Posture Ergonomics for Neck Discomfort

## 2021-10-15 ENCOUNTER — Ambulatory Visit: Payer: BC Managed Care – PPO | Admitting: Physical Therapy

## 2021-10-15 ENCOUNTER — Other Ambulatory Visit: Payer: Self-pay

## 2021-10-15 DIAGNOSIS — M542 Cervicalgia: Secondary | ICD-10-CM

## 2021-10-15 DIAGNOSIS — M62838 Other muscle spasm: Secondary | ICD-10-CM

## 2021-10-15 DIAGNOSIS — M25651 Stiffness of right hip, not elsewhere classified: Secondary | ICD-10-CM

## 2021-10-15 DIAGNOSIS — R262 Difficulty in walking, not elsewhere classified: Secondary | ICD-10-CM

## 2021-10-15 DIAGNOSIS — R2689 Other abnormalities of gait and mobility: Secondary | ICD-10-CM

## 2021-10-15 DIAGNOSIS — M25551 Pain in right hip: Secondary | ICD-10-CM

## 2021-10-15 NOTE — Patient Instructions (Signed)
°  Elbows and bottom of the upper arm, not whole upper arm on the bench. DO NOT OVERARCH THE LOW BACK DO NOT DROP THE HEAD Bottom rib on the abs at all times Shoulders in the joint Rock back into the hips Anchor into the bench with the WHOLE elbow. Try to anchor in there and use that to pry the thoracic spine open. Chest up, but bottom rib down.

## 2021-10-16 ENCOUNTER — Ambulatory Visit (INDEPENDENT_AMBULATORY_CARE_PROVIDER_SITE_OTHER): Payer: BC Managed Care – PPO | Admitting: Gastroenterology

## 2021-10-16 ENCOUNTER — Encounter: Payer: Self-pay | Admitting: Gastroenterology

## 2021-10-16 VITALS — BP 122/78 | HR 70 | Ht 66.0 in | Wt 246.4 lb

## 2021-10-16 DIAGNOSIS — R1011 Right upper quadrant pain: Secondary | ICD-10-CM

## 2021-10-16 MED ORDER — OMEPRAZOLE 20 MG PO CPDR: 20.0000 mg | DELAYED_RELEASE_CAPSULE | Freq: Two times a day (BID) | ORAL | 2 refills | Status: DC

## 2021-10-16 NOTE — Patient Instructions (Addendum)
If you are age 53 or older, your body mass index should be between 23-30. Your Body mass index is 39.77 kg/m. If this is out of the aforementioned range listed, please consider follow up with your Primary Care Provider.  If you are age 29 or younger, your body mass index should be between 19-25. Your Body mass index is 39.77 kg/m. If this is out of the aformentioned range listed, please consider follow up with your Primary Care Provider.   ________________________________________________________  The [facility] providers would like to encourage you to use [facility] to communicate with providers for non-urgent requests or questions.  Due to long hold times on the telephone, sending your provider a message by [facility] may be a faster and more efficient way to get a response.  Please allow 48 business hours for a response.  Please remember that this is for non-urgent requests.  _______________________________________________________  Please call in 6 months to schedule an office visit.  Continue with exercise and maintain weight  We have sent the following medications to your pharmacy for you to pick up at your convenience: Protonix take 20mg  2 times a day for 4 weeks then decrease to daily  Please call with any questions or concerns.  Thank you,  Dr. 

## 2021-10-16 NOTE — Progress Notes (Signed)
Chief Complaint:   Referring Provider:  Macy Mis, MD      ASSESSMENT AND PLAN;   #1. Fatty liver with Nl LFTs (liver Bx 03/2020 <10% fatty liver, no NASH). AST:plt (APRI) ratio of 0.3. Alb 4.2 (03/2019). No ETOH. Has assoc Obesity, HLD. No DM.   #2.RUQ pain. ? Etiology. Likely musculoskeltal. R/O other causes. Neg UGI series except small HH 07/04/2019, neg CT and then MRI/MRCP 06/17/2019 except for small hepatic cysts and fatty liver.  #3. GERD with small HH with occasional dysphagia  #4. Colorectal cancer screening.  #5. H/O PE (oct 2022) on eliquis.   Plan:  -Recommend to gradually lose weight -Continue exercising.   -Increase omeprazole 20 mg p.o. BID x 4 weeks #60, 11 refills -Cannot take eliquis at this time per hematology, hold off on EGD/colon.   -FU in 6 months.  Would reconsider at that time.    HPI:    Ruth Matthews is a 55 y.o. female  For follow-up  Chronic RUQ pain, worse after eating. Occ nausea, no vomiting.  Does feel full especially after eating large or fatty meals.  Better with Tylenol.  Occasional dysphagia.  Heartburn and indigestion is better with omeprazole. S/p cholecystectomy 1994.  There are 2 types of pain Type I-which is more constant, appears to be musculoskeletal, x 30 years ever since cholecystectomy.  Type II-which is related to eating.  This is waxing and waning.  Omeprazole helps.  She takes omeprazole in AM.  She has more problems with dinner.  No vomiting, heartburn, regurgitation, odynophagia. No significant diarrhea or constipation (occ with meds).  No melena or hematochezia. No unintentional weight loss.  USE showed ?significant fatty liver disease with kPA of 45.3 s/o advanced liver disease.  Looks like it was false positive.  Labs were all normal.  She underwent liver biopsy which showed less than 10% steatosis without NASH.  I have gone over in detail with her regarding liver biopsy.  She had multiple questions. I  have answered all those to the best of my ability.  I have reassured her multiple times.  I have offered her another opinion with hepatologist as well, if she would like (just for peace of mind).  She will think about it.  Neg UGI series except small HH 07/04/2019, neg CT and then MRI/MRCP 06/17/2019 except for small hepatic cysts and fatty liver.  Has joined core-life thru [facility] previously and was able to reduce 9 pounds.  Unfortunately, she stopped and now has gained weight as below.  No nonsteroidals.  No history of itching, skin lesions, easy bruisability, intake of over-the-counter medications including diet pills, herbal medications, anabolic steroids or Tylenol.  There is no history of blood transfusions, IV drug use or family history of liver disease.  No jaundice dark urine or pale stools.    No history of alcohol use/abuse.  H/O PE Oct 2022- on eliquis.  Has remote history of DVT.  Being followed by hematology.  Wt Readings from Last 3 Encounters:  10/16/21 246 lb 6 oz (111.8 kg)  10/03/21 242 lb 8 oz (110 kg)  09/22/21 241 lb 3.2 oz (109.4 kg)     Past Medical History:  Diagnosis Date   Anxiety    DVT (deep venous thrombosis) (HCC)    Elevated fasting blood sugar    Gallstone    Gestational diabetes 2000   Hypertension    Left anterior fascicular block    idiopathic 1/12-Dr. Anne Fu, Dr. Graciela Husbands  Obesity    Ovarian cyst    ruptured   RMSF [facility] spotted fever) 2004   history of    Varicose veins    Ventricular tachycardia    Vitamin D deficiency     Past Surgical History:  Procedure Laterality Date   CARDIAC CATHETERIZATION     no CAD   CESAREAN SECTION     CHOLECYSTECTOMY     ENDOVENOUS ABLATION SAPHENOUS VEIN W/ LASER Bilateral 02/2011   Both legs a few weeks apart   LIVER BIOPSY      Family History  Problem Relation Age of Onset   Hypertension Mother    COPD Mother    Other Father        died from accident, h/o lyme disease   Colon cancer  Neg Hx    Esophageal cancer Neg Hx     Social History   Tobacco Use   Smoking status: Never   Smokeless tobacco: Never  Vaping Use   Vaping Use: Never used  Substance Use Topics   Alcohol use: Yes    Alcohol/week: 0.0 standard drinks    Comment: occasionally   Drug use: No    Current Outpatient Medications  Medication Sig Dispense Refill   albuterol (VENTOLIN HFA) 108 (90 Base) MCG/ACT inhaler Inhale 2 puffs into the lungs every 6 (six) hours as needed for wheezing or shortness of breath. 8 g 2   ELIQUIS 5 MG TABS tablet Take 5 mg by mouth 2 (two) times daily.     EPINEPHrine 0.3 mg/0.3 mL IJ SOAJ injection      fexofenadine (ALLEGRA) 180 MG tablet Take 4 tablets by mouth daily.     fluticasone (FLONASE) 50 MCG/ACT nasal spray Place 1 spray into both nostrils daily.     Multiple Vitamin (MULTIVITAMIN ADULT PO) Take by mouth.     omeprazole (PRILOSEC) 20 MG capsule Take 20 mg by mouth daily.     Propylene Glycol (SYSTANE BALANCE) 0.6 % SOLN Place 1 drop into both eyes daily as needed (dry eyes).     verapamil (CALAN-SR) 240 MG CR tablet Take 1 tablet (240 mg total) by mouth daily. 90 tablet 2   No current facility-administered medications for this visit.    Allergies  Allergen Reactions   Latex Itching, Swelling and Rash   Azithromycin     Upset stomach    Garlic Swelling    Tongue swelling and sensation of throat swelling Tongue swelling and sensation of throat swelling    Onion Swelling    Tongue swelling and sensation of throat swelling Tongue swelling and sensation of throat swelling    Other     "whitening toothpaste"  "earring metals," "nickels and metals"    Levaquin [Levofloxacin]     Review of Systems:  Has situational anxiety.     Physical Exam:    BP 122/78    Pulse 70    Ht 5\' 6"  (1.676 m)    Wt 246 lb 6 oz (111.8 kg)    SpO2 98%    BMI 39.77 kg/m  Filed Weights   10/16/21 0935  Weight: 246 lb 6 oz (111.8 kg)  Gen: awake, alert, NAD HEENT:  anicteric, no pallor CV: RRR, no mrg Pulm: CTA b/l Abd: soft, NT/ND, +BS throughout Ext: no c/c/e Neuro: nonfocal   Data Reviewed: I have personally reviewed following labs and imaging studies  CBC: CBC Latest Ref Rng & Units 07/24/2020 09/20/2019 09/04/2019  WBC 3.4 - 10.8 x10E3/uL 5.2  6.9 4.9  Hemoglobin 11.1 - 15.9 g/dL 13.7 14.0 13.4  Hematocrit 34.0 - 46.6 % 41.3 44.2 41.1  Platelets 150 - 450 x10E3/uL 218 196 188.0    CMP: CMP Latest Ref Rng & Units 07/24/2020 09/04/2019 09/04/2019  Glucose 65 - 99 mg/dL 104(H) - 111(H)  BUN 6 - 24 mg/dL 11 - 11  Creatinine 0.57 - 1.00 mg/dL 0.75 - 0.75  Sodium 134 - 144 mmol/L 140 - 138  Potassium 3.5 - 5.2 mmol/L 4.6 - 4.0  Chloride 96 - 106 mmol/L 103 - 104  CO2 20 - 29 mmol/L 23 - 24  Calcium 8.7 - 10.2 mg/dL 9.7 - 9.5  Total Protein 6.0 - 8.5 g/dL 7.8 7.4 7.9  Total Bilirubin 0.0 - 1.2 mg/dL 0.2 - 0.4  Alkaline Phos 44 - 121 IU/L 89 - 66  AST 0 - 40 IU/L 20 - 14  ALT 0 - 32 IU/L 25 - 22         Carmell Austria, MD 10/16/2021, 9:56 AM  Cc: Katherina Mires, MD

## 2021-10-16 NOTE — Therapy (Signed)
[facility] [address]  [place], [place], [address] Phone: [phone]   Fax:  [phone]  Physical Therapy Treatment and Discharge  Patient Details  Name: Ruth Matthews MRN: [id number] Date of Birth: Aug 02, 1966 Referring Provider (PT): Katherina Mires MD  PHYSICAL THERAPY DISCHARGE SUMMARY  Visits from Start of Care: 5  Current functional level related to goals / functional outcomes: See below   Remaining deficits: See below   Education / Equipment: See below   Patient agrees to discharge. Patient goals were partially met. Patient is being discharged due to the patient's request.   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 Marlou Porch, Dr. Caryl Comes   Obesity    Ovarian cyst    ruptured   RMSF [facility] spotted fever) 2004   history of    Varicose veins    Ventricular tachycardia    Vitamin D deficiency     Past Surgical History:  Procedure Laterality Date   CARDIAC CATHETERIZATION     no CAD   CESAREAN SECTION     CHOLECYSTECTOMY     ENDOVENOUS ABLATION SAPHENOUS VEIN W/ LASER Bilateral 02/2011   Both legs a few weeks apart   LIVER BIOPSY      There were no vitals filed for this visit.   Subjective Assessment - 10/15/21 1307     Subjective Pt reports neck is a lot better. "More stiff than actual pain." Back  is still tight between the shoulder blades.    Pertinent History history of PE, DOE, fatigue, anxiety, fibromyalgia, DVT 2017, [place] spotted feaver, HTN    Limitations Reading;Writing;House hold activities    Diagnostic tests 11/23- MRI cervical There is a small central disc herniation at C4-5 that effaces the thecal sac without compressing the cord.  Mild degenerative disc disease and mild disc bulge at C5-6.  Otherwise no significant abnormality.   MRA Angio Neck IMPRESSION: Normal    Patient Stated Goals be able to move better without pain    Currently in Pain? Yes    Pain Score 2     Pain Location Neck    Pain Orientation Right    Pain Descriptors / Indicators Tightness    Pain Type Chronic pain    Pain Onset More than a month ago   Aug 05, 2021               [facility] PT Assessment - 10/16/21 0001       Assessment   Medical Diagnosis M54.2 (ICD-10-CM) - Cervicalgia    Referring Provider (PT) Katherina Mires MD    Onset Date/Surgical Date 08/05/21  [place] [facility] PT Treatment/Exercise - 10/16/21 0001       Neck Exercises: Machines for Strengthening   UBE (Upper Arm Bike) L1 x 3 min forward, 3 min backward      Neck Exercises: Sidelying   Other Sidelying Exercise open book x 10 bil for thoracic mobilization    Other Sidelying Exercise cat/cow x10      Lumbar Exercises: Stretches   Piriformis Stretch Right;Left;10 seconds   for review only   Other Lumbar Stretch Exercise Kneeling bench thoracic mobilization 3x20"    Other Lumbar Stretch Exercise cobra x20 sec; child's pose x20 sec; thread the needle x20 sec each side      Shoulder Exercises: Supine   Diagonals Strengthening;Theraband;10 reps    Theraband Level (Shoulder Diagonals) Level 1 (Yellow);Level 3 (Green)      Shoulder Exercises: Prone   Other Prone Exercises "T" and "Y" no weight 2x10 each side      Manual Therapy   Manual Therapy Soft tissue mobilization;Joint  mobilization    Manual therapy comments to thoracic spine to decrease tightness    Joint Mobilization self thoracic mobilization with foam roll    Soft tissue mobilization STM: L lower trap/mid trap, rhomboids, thoracic paraspinals                     PT Education - 10/15/21 1400     Education Details Discussed self progressing at home. Reviewed exercises as needed. Continued discussions to decrease UT overactivity with overhead lifting.    Person(s) Educated Patient    Methods Explanation;Demonstration;Tactile cues;Verbal cues;Handout    Comprehension Verbalized understanding;Returned demonstration;Verbal cues required;Tactile cues required              PT Short Term Goals - 10/10/21 1223       PT SHORT TERM GOAL #1   Title Patient to be independent with initial HEP.    Time 1    Period Weeks    Status Achieved    Target Date 10/06/21               PT Long Term Goals - 10/15/21 1312       PT LONG TERM GOAL #1   Title Patient to be independent with advanced HEP.    Time 3    Period Weeks    Status Achieved   10/10/21- updated.   Target Date 10/20/21      PT LONG TERM GOAL #2   Title Pt will demonstrate 60 deg cervical Rotation bil for safety with driving.    Baseline deficits, see flow sheet; 70 deg on R, 75 deg to L on 10/15/21    Time 3    Period Weeks    Status Achieved    Target Date 10/20/21      PT LONG TERM GOAL #3   Title Patient will report 50% improvement in neck pain/stiffness    Baseline 60% improvement 10/15/21    Time 3    Period Weeks    Status Achieved    Target Date 10/20/21      PT LONG TERM GOAL #4   Title Pt. will report 50% improvement in neck/shoulder stiffness with overhead activities    Baseline reports stiffness/tightness with all cervical and shoulder movements; 25% improvement reported 10/15/21    Time 3    Period Weeks    Status Partially Met    Target Date 10/20/21  Plan -  10/15/21 1401     Clinical Impression Statement Last session per pt request. Cervical ROM has improved and pain has decreased. Pt with increased FOTO score but not up to predicted level (predicted to be 62 at visit 11). Reviewed her exercises as needed. Continued to work on thoracic/periscapular tightness with manual therapy -- discussed for pt to continue to work on this at home with foam roll, theracane, etc. Progressed midback and posterior shoulder strengthening and overhead movement. Updated pt's HEP accordingly. Discussed with pt to call clinic or send a message through [place] if any issues. Pt has met or partially met her LTGs. Pt is ready for PT d/c.    Personal Factors and Comorbidities Age;Time since onset of injury/illness/exacerbation;Comorbidity 3+    Comorbidities history of PE, DOE, fatigue, anxiety, fibromyalgia, DVT 2017, [place] spotted feaver, HTN    Examination-Activity Limitations Bed Mobility;Sit;Reach Overhead;Lift;Carry    Examination-Participation Restrictions Occupation;Cleaning;Community Activity;Driving    Stability/Clinical Decision Making Evolving/Moderate complexity    Rehab Potential Excellent    PT Frequency --   2-3x/week   PT Duration 3 weeks    PT Treatment/Interventions ADLs/Self Care Home Management;Cryotherapy;Electrical Stimulation;Iontophoresis 4mg /ml Dexamethasone;Moist Heat;Traction;Ultrasound;Functional mobility training;Therapeutic activities;Therapeutic exercise;Neuromuscular re-education;Patient/family education;Manual techniques;Taping;Dry needling;Passive range of motion;Spinal Manipulations;Joint Manipulations    PT Next Visit Plan review and progress HEP for postural strengthening, dry needling UT/LS, manual therapy and modalities PRN.    PT Home Exercise Plan Access Code: [email or web address]    Consulted and Agree with Plan of Care Patient             Patient will benefit from skilled therapeutic intervention in order to improve the following  deficits and impairments:  Hypomobility, Increased muscle spasms, Decreased mobility, Decreased range of motion, Improper body mechanics, Decreased activity tolerance, Increased fascial restricitons, Impaired flexibility, Impaired UE functional use, Postural dysfunction, Pain  Visit Diagnosis: Cervicalgia  Other muscle spasm  Other abnormalities of gait and mobility  Pain in right hip  Stiffness of right hip, not elsewhere classified  Difficulty in walking, not elsewhere classified     Problem List Patient Active Problem List   Diagnosis Date Noted   Shortness of breath 09/22/2021   Multiple subsegmental pulmonary emboli without acute cor pulmonale (HCC) 09/22/2021   Laryngopharyngeal reflux (LPR) 06/11/2020   Sensorineural hearing loss (SNHL) of left ear with unrestricted hearing of right ear 06/11/2020   Tinnitus, left 06/11/2020   Tonsillar calculus 06/11/2020   Gastroesophageal reflux disease 02/23/2020   NAFLD (nonalcoholic fatty liver disease) 08/30/2019   Arthralgia 04/29/2018   Migraines 08/06/2017   Superficial thrombophlebitis 04/03/2017   Anxiety and depression 10/27/2016   Insomnia 07/17/2016   History of DVT (deep vein thrombosis) 01/20/2016   Allergic rhinitis 12/25/2015   Obesity 12/25/2015   Sinus pressure 12/25/2015   Tachycardia 12/25/2015   Varicose veins of both legs with edema 12/25/2015   Vitamin D deficiency 12/25/2015   DVT (deep venous thrombosis) ([place]) 04/23/2015   Paroxysmal ventricular tachycardia 12/11/2013   Essential hypertension, benign 12/11/2013   Other and unspecified hyperlipidemia 12/11/2013   Edema 12/11/2013   Other malaise and fatigue 12/11/2013    [facility] April Gordy Levan, PT, DPT 10/16/2021, 11:20 AM  [facility] [address]  [place], [place], [address] Phone: [phone]   Fax:  [phone]  Name: Ruth Matthews MRN: [id number] Date of Birth:  [birth year]

## 2021-10-17 ENCOUNTER — Ambulatory Visit (INDEPENDENT_AMBULATORY_CARE_PROVIDER_SITE_OTHER): Payer: BC Managed Care – PPO | Admitting: Pulmonary Disease

## 2021-10-17 ENCOUNTER — Other Ambulatory Visit: Payer: Self-pay

## 2021-10-17 DIAGNOSIS — I2694 Multiple subsegmental pulmonary emboli without acute cor pulmonale: Secondary | ICD-10-CM

## 2021-10-17 NOTE — Progress Notes (Signed)
Full PFT completed today ? ?

## 2021-10-21 ENCOUNTER — Ambulatory Visit: Payer: BC Managed Care – PPO | Admitting: Nurse Practitioner

## 2021-10-23 ENCOUNTER — Other Ambulatory Visit: Payer: Self-pay

## 2021-10-23 ENCOUNTER — Ambulatory Visit: Payer: BC Managed Care – PPO | Admitting: Nurse Practitioner

## 2021-10-23 ENCOUNTER — Encounter: Payer: Self-pay | Admitting: Nurse Practitioner

## 2021-10-23 VITALS — BP 122/76 | HR 96 | Temp 98.2°F | Ht 66.0 in | Wt 242.4 lb

## 2021-10-23 DIAGNOSIS — K219 Gastro-esophageal reflux disease without esophagitis: Secondary | ICD-10-CM | POA: Diagnosis not present

## 2021-10-23 DIAGNOSIS — R0602 Shortness of breath: Secondary | ICD-10-CM | POA: Diagnosis not present

## 2021-10-23 DIAGNOSIS — J301 Allergic rhinitis due to pollen: Secondary | ICD-10-CM

## 2021-10-23 DIAGNOSIS — R42 Dizziness and giddiness: Secondary | ICD-10-CM

## 2021-10-23 DIAGNOSIS — I2694 Multiple subsegmental pulmonary emboli without acute cor pulmonale: Secondary | ICD-10-CM

## 2021-10-23 LAB — PULMONARY FUNCTION TEST
DL/VA % pred: 103 %
DL/VA: 4.35 ml/min/mmHg/L
DLCO cor % pred: 101 %
DLCO cor: 22.64 ml/min/mmHg
DLCO unc % pred: 101 %
DLCO unc: 22.64 ml/min/mmHg
FEF 25-75 Post: 3.13 L/sec
FEF 25-75 Pre: 3.23 L/sec
FEF2575-%Change-Post: -2 %
FEF2575-%Pred-Post: 113 %
FEF2575-%Pred-Pre: 117 %
FEV1-%Change-Post: -1 %
FEV1-%Pred-Post: 105 %
FEV1-%Pred-Pre: 107 %
FEV1-Post: 3.08 L
FEV1-Pre: 3.14 L
FEV1FVC-%Change-Post: 0 %
FEV1FVC-%Pred-Pre: 102 %
FEV6-%Change-Post: -4 %
FEV6-%Pred-Post: 101 %
FEV6-%Pred-Pre: 106 %
FEV6-Post: 3.68 L
FEV6-Pre: 3.85 L
FEV6FVC-%Pred-Post: 102 %
FEV6FVC-%Pred-Pre: 102 %
FVC-%Change-Post: -2 %
FVC-%Pred-Post: 101 %
FVC-%Pred-Pre: 103 %
FVC-Post: 3.77 L
FVC-Pre: 3.85 L
Post FEV1/FVC ratio: 82 %
Post FEV6/FVC ratio: 100 %
Pre FEV1/FVC ratio: 81 %
Pre FEV6/FVC Ratio: 100 %
RV % pred: 85 %
RV: 1.67 L
TLC % pred: 101 %
TLC: 5.44 L

## 2021-10-23 NOTE — Assessment & Plan Note (Signed)
Continues to slowly improve. Likely multifactorial related to recent PEs and deconditioning. No other obvious pulmonary cause for DOE. Pulmonary function normal. Walking oximetry today without any desaturations. Advised to try albuterol inhaler 15 min prior to activity, even though she did not feel a response during the PFTs. Continue exercise regimen. Discussed that it can take some time to heal after these events. Follow up with cardiology as scheduled.  Patient Instructions  -Continue Eliquis 5 mg Twice daily, as prescribed by hematology. Notify of any excessive bleeding/bruising. -Continue allegra as previously prescribed  -Continue flonase 1-2 sprays each nostril daily for allergy symptoms  -Albuterol 2 puffs 15 minutes prior to activity or 2 puffs every 6  hours as needed for shortness of breath/wheezing  Your pulmonary function testing showed normal lung volumes and function.   Follow up with cardiology as scheduled.  Continue to exercise daily, as tolerated.   Walking oximetry today showed that your oxygen is not dropping with exertion, which is good.   Follow up with Dr. Everardo All in 4-6 months. If symptoms do not improve or worsen, please contact office for sooner follow up or seek emergency care.

## 2021-10-23 NOTE — Assessment & Plan Note (Signed)
Continue lifelong Eliquis 5 mg Twice daily, as prescribed by hematology. Advised to reach out and determine when her next follow up with them should be as their most recent note did not specify.

## 2021-10-23 NOTE — Progress Notes (Signed)
@Patient  ID: Ruth Matthews, female    DOB: 22-Dec-1965, 57 y.o.   MRN: [id number]  Chief Complaint  Patient presents with   Follow-up    Patient is here for follow up on pft she had    Referring provider: Katherina Mires, MD  HPI: 56 year old female, never smoker followed for dyspnea and symptoms related to recent PE. She developed multiple subsegmental pulmonary emboli without acute cor pulmonale in October 2022 after long drive and COVID vaccination which she is currently on lifelong anticoagulation for. She is a patient of Dr. Cordelia Pen and last seen in office on 09/22/2021. Past medical history significant for previous post-op DVT 2012, fibromyalgia, anxiety, HTN, obesity, hx of RMSF.   TEST/EVENTS:  01/06/2021 echocardiogram: normal LV systolic and diastolic function. No significant valvular abnormalities  08/05/2021 CTA: multiple bilateral pulmonary emboli in the upper and lower lobes   09/22/2021: OV with Dr. Loanne Drilling. New consult for dyspnea related to multiple subsegmental PE. Reported experiencing dizziness, shortness of breath and occasional wheezing. 80% return to baseline day to day and 60% return to baseline with exercise. 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 PFTs ordered. Advised trailing albuterol 15 min prior to exercise.   10/03/2021: OV with cardiology. Previously evaluated in 2012 for palpitations and exertional presyncope per note - EKG changes warranted IV amio with successful conversion. Reported dyspnea in March 2022 - echo showed normal function without valvular disease. Reviewed echo from [place] at this visit - LVEF 60-65%, G1DD, trace MR, mild TR; not concerning and discussed with patient. Referred to PREP exercise program at the [facility] for weight loss. Ordered 14 day heart monitor for palpitations/dizziness. Suspect DOE likely multifactorial but mainly related to deconditioning.    10/23/2020: Today - follow up after PFTs Patient presents today after PFTs, which were completed on 12/30. Her pulmonary function testing was unremarkable with normal lung function, volumes and diffusing capacity. She continues to experience some dyspnea upon exertion, but reports an slow improvement in this as time goes on. She states that some days are better than other. She does continue to have an intermittent wheeze, which she has not used her albuterol inhaler for but states she didn't notice much of a difference when it was administered during her PFTs. She continues to have some dizziness, which cardiology ordered a 2 week heart monitor for and she just completed last week - awaiting results. She has not had any syncopal events. She does have chronic rhinitis and seasonal allergies, which she has seen an allergist for in the past and is treated with Allegra and flonase. She denies cough, PND, chest pain, or lower extremity edema. She continues on Eliquis Twice daily and denies an excessive bleeding/brusing or hemoptysis. She is unsure when she is supposed to follow up with hematology next. She does have some ongoing neck pain, which she is working with PT for. She continues to exercise daily. Overall, she feels as though she is slowly improving but is not back to her baseline.   Allergies  Allergen Reactions   Latex Itching, Swelling and Rash   Azithromycin     Upset stomach    Garlic Swelling    Tongue swelling and sensation of throat swelling Tongue swelling and sensation of throat swelling    Onion Swelling    Tongue swelling and sensation of throat swelling Tongue swelling and sensation of throat swelling    Other     "  whitening toothpaste"  "earring metals," "nickels and metals"    Levaquin [Levofloxacin]     Immunization History  Administered Date(s) Administered   Influenza Inj Mdck Quad Pf 07/27/2019   Influenza, High Dose Seasonal PF 01/03/2020   Influenza,inj,Quad PF,6+  Mos 08/12/2018   PFIZER(Purple Top)SARS-COV-2 Vaccination 12/18/2019, 01/03/2020   Pfizer Covid-19 Vaccine Bivalent Booster 108yrs & up 07/29/2021   Tdap 07/21/2021    Past Medical History:  Diagnosis Date   Anxiety    DVT (deep venous thrombosis) (HCC)    Elevated fasting blood sugar    Gallstone    Gestational diabetes 2000   Hypertension    Left anterior fascicular block    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 Marlou Porch, Dr. Caryl Comes   Obesity    Ovarian cyst    ruptured   RMSF [facility] spotted fever) 2004   history of    Varicose veins    Ventricular tachycardia    Vitamin D deficiency     Tobacco History: Social History   Tobacco Use  Smoking Status Never  Smokeless Tobacco Never   Counseling given: Not Answered   Outpatient Medications Prior to Visit  Medication Sig Dispense Refill   albuterol (VENTOLIN HFA) 108 (90 Base) MCG/ACT inhaler Inhale 2 puffs into the lungs every 6 (six) hours as needed for wheezing or shortness of breath. 8 g 2   ELIQUIS 5 MG TABS tablet Take 5 mg by mouth 2 (two) times daily.     EPINEPHrine 0.3 mg/0.3 mL IJ SOAJ injection      fexofenadine (ALLEGRA) 180 MG tablet Take 4 tablets by mouth daily.     fluticasone (FLONASE) 50 MCG/ACT nasal spray Place 1 spray into both nostrils daily.     Multiple Vitamin (MULTIVITAMIN ADULT PO) Take by mouth.     omeprazole (PRILOSEC) 20 MG capsule Take 1 capsule (20 mg total) by mouth 2 (two) times daily. For 4 weeks than decrease to daily 180 capsule 2   Propylene Glycol (SYSTANE BALANCE) 0.6 % SOLN Place 1 drop into both eyes daily as needed (dry eyes).     verapamil (CALAN-SR) 240 MG CR tablet Take 1 tablet (240 mg total) by mouth daily. 90 tablet 2   No facility-administered medications prior to visit.     Review of Systems:   Constitutional: No weight loss or gain, night sweats, fevers, chills. +fatigue HEENT: No headaches, difficulty swallowing, tooth/dental problems, or sore throat. No sneezing, itching,  ear ache. +occasional nasal congestion, allergies CV:  +dizziness. No chest pain, orthopnea, PND, swelling in lower extremities, anasarca, palpitations, syncope Resp: +shortness of breath with exertion (improving); occasional wheeze. No excess mucus or change in color of mucus. No productive or non-productive. No hemoptysis. No chest wall deformity GI:  No heartburn, indigestion, abdominal pain, nausea, vomiting, diarrhea, change in bowel habits, loss of appetite, bloody stools.  GU: No dysuria, change in color of urine, urgency or frequency.  No flank pain, no hematuria  Skin: No rash, lesions, ulcerations MSK:  No joint swelling.  No decreased range of motion.  +back/neck pain Neuro: No dizziness or lightheadedness.  Psych: No depression or anxiety. Mood stable.     Physical Exam:  BP 122/76 (BP Location: Left Arm, Patient Position: Sitting, Cuff Size: Large)    Pulse 96    Temp 98.2 F (36.8 C) (Oral)    Ht 5\' 6"  (1.676 m)    Wt 242 lb 6.4 oz (110 kg)    SpO2 96%    BMI 39.12 kg/m  GEN: Pleasant, interactive, well-appearing; obese; in no acute distress. HEENT:  Normocephalic and atraumatic. EACs patent bilaterally. PERRLA. Sclera white. Nasal turbinates pink, moist and patent bilaterally. No rhinorrhea present. Oropharynx pink and moist, without exudate or edema. No lesions, ulcerations, or postnasal drip.  NECK:  Supple w/ fair ROM. No JVD present. Normal carotid impulses w/o bruits. Thyroid symmetrical with no goiter or nodules palpated. No lymphadenopathy.   CV: RRR, no m/r/g, no peripheral edema. Pulses intact, +2 bilaterally. No cyanosis, pallor or clubbing. PULMONARY:  Unlabored, regular breathing. Clear bilaterally A&P w/o wheezes/rales/rhonchi. No accessory muscle use. No dullness to percussion. GI: BS present and normoactive. Soft, non-tender to palpation. No organomegaly or masses detected. No CVA tenderness. MSK: No erythema, warmth or tenderness. Cap refil <2 sec all extrem.  No deformities or joint swelling noted.  Neuro: A/Ox3. No focal deficits noted.   Skin: Warm, no lesions or rashe Psych: Normal affect and behavior. Judgement and thought content appropriate.     Lab Results:  CBC    Component Value Date/Time   WBC 5.2 07/24/2020 0912   WBC 6.9 09/20/2019 1230   RBC 5.03 07/24/2020 0912   RBC 5.28 (H) 09/20/2019 1230   HGB 13.7 07/24/2020 0912   HCT 41.3 07/24/2020 0912   PLT 218 07/24/2020 0912   MCV 82 07/24/2020 0912   MCH 27.2 07/24/2020 0912   MCH 26.5 09/20/2019 1230   MCHC 33.2 07/24/2020 0912   MCHC 31.7 09/20/2019 1230   RDW 14.6 07/24/2020 0912   LYMPHSABS 1.3 09/04/2019 0815   MONOABS 0.5 09/04/2019 0815   EOSABS 0.2 09/04/2019 0815   BASOSABS 0.0 09/04/2019 0815    BMET    Component Value Date/Time   NA 140 07/24/2020 0912   K 4.6 07/24/2020 0912   CL 103 07/24/2020 0912   CO2 23 07/24/2020 0912   GLUCOSE 104 (H) 07/24/2020 0912   GLUCOSE 111 (H) 09/04/2019 0815   BUN 11 07/24/2020 0912   CREATININE 0.75 07/24/2020 0912   CALCIUM 9.7 07/24/2020 0912   GFRNONAA 92 07/24/2020 0912   GFRAA 106 07/24/2020 0912    BNP No results found for: BNP   Imaging:  No results found.    PFT Results Latest Ref Rng & Units 10/17/2021  FVC-Pre L 3.85  FVC-Predicted Pre % 103  FVC-Post L 3.77  FVC-Predicted Post % 101  Pre FEV1/FVC % % 81  Post FEV1/FCV % % 82  FEV1-Pre L 3.14  FEV1-Predicted Pre % 107  FEV1-Post L 3.08  DLCO uncorrected ml/min/mmHg 22.64  DLCO UNC% % 101  DLCO corrected ml/min/mmHg 22.64  DLCO COR %Predicted % 101  DLVA Predicted % 103  TLC L 5.44  TLC % Predicted % 101  RV % Predicted % 85    No results found for: NITRICOXIDE      Assessment & Plan:   Shortness of breath Continues to slowly improve. Likely multifactorial related to recent PEs and deconditioning. No other obvious pulmonary cause for DOE. Pulmonary function normal. Walking oximetry today without any desaturations. Advised  to try albuterol inhaler 15 min prior to activity, even though she did not feel a response during the PFTs. Continue exercise regimen. Discussed that it can take some time to heal after these events. Follow up with cardiology as scheduled.  Patient Instructions  -Continue Eliquis 5 mg Twice daily, as prescribed by hematology. Notify of any excessive bleeding/bruising. -Continue allegra as previously prescribed  -Continue flonase 1-2 sprays each nostril daily for allergy symptoms  -  Albuterol 2 puffs 15 minutes prior to activity or 2 puffs every 6  hours as needed for shortness of breath/wheezing  Your pulmonary function testing showed normal lung volumes and function.   Follow up with cardiology as scheduled.  Continue to exercise daily, as tolerated.   Walking oximetry today showed that your oxygen is not dropping with exertion, which is good.   Follow up with Dr. Loanne Drilling in 4-6 months. If symptoms do not improve or worsen, please contact office for sooner follow up or seek emergency care.   Dizziness Completed 2 week heart monitor - awaiting results. Follow up with cardiology to discuss next steps, as scheduled. Orthostatic VS today nl.   Multiple subsegmental pulmonary emboli without acute cor pulmonale (HCC) Continue lifelong Eliquis 5 mg Twice daily, as prescribed by hematology. Advised to reach out and determine when her next follow up with them should be as their most recent note did not specify.   Allergic rhinitis Symptoms mostly stable on current regimen. Could consider referring back to allergist, if problems persist. Continue current regimen with Allegra and flonase.   Gastroesophageal reflux disease Well-controlled. Continue omeprazole.    Clayton Bibles, NP 10/23/2021  Pt aware and understands NP's role.

## 2021-10-23 NOTE — Assessment & Plan Note (Signed)
Symptoms mostly stable on current regimen. Could consider referring back to allergist, if problems persist. Continue current regimen with Allegra and flonase.

## 2021-10-23 NOTE — Patient Instructions (Addendum)
-  Continue Eliquis 5 mg Twice daily, as prescribed by hematology. Notify of any excessive bleeding/bruising. -Continue allegra as previously prescribed  -Continue flonase 1-2 sprays each nostril daily for allergy symptoms  -Albuterol 2 puffs 15 minutes prior to activity or 2 puffs every 6  hours as needed for shortness of breath/wheezing  Your pulmonary function testing showed normal lung volumes and function.   Follow up with cardiology as scheduled.  Continue to exercise daily, as tolerated.   Walking oximetry today showed that your oxygen is not dropping with exertion, which is good.   Follow up with Dr. Loanne Drilling in 4-6 months. If symptoms do not improve or worsen, please contact office for sooner follow up or seek emergency care.

## 2021-10-23 NOTE — Assessment & Plan Note (Signed)
Completed 2 week heart monitor - awaiting results. Follow up with cardiology to discuss next steps, as scheduled. Orthostatic VS today nl.

## 2021-10-23 NOTE — Assessment & Plan Note (Signed)
Well-controlled.  Continue omeprazole. 

## 2021-10-29 NOTE — Progress Notes (Signed)
Seen by patient Ruth Matthews on 10/28/2021  5:56 PM

## 2021-11-11 ENCOUNTER — Other Ambulatory Visit: Payer: Self-pay

## 2021-11-11 MED ORDER — VERAPAMIL HCL ER 240 MG PO TBCR: 240.0000 mg | EXTENDED_RELEASE_TABLET | Freq: Every day | ORAL | 2 refills | Status: DC

## 2021-11-16 ENCOUNTER — Encounter: Payer: Self-pay | Admitting: Pulmonary Disease

## 2021-11-16 DIAGNOSIS — R0602 Shortness of breath: Secondary | ICD-10-CM

## 2021-11-17 NOTE — Telephone Encounter (Signed)
I called patient and discussed strategies to improve her breathing, chest congestion and oxygen including mucinex DM, deep breaths including yoga-like exercises, daily activity and proned position when sleeping (she is unable to lay on shoulders so advised pillow to support).  She reports SpO2 now 95-95% when erect but sometimes low 90s at rest. Encouraged activities as above. If she develops worsening oxygenation +/- fever will need to consider antibiotics.  Staff - please schedule patient for CXR and follow up with me at 3 pm on 11/24/21.  Rodman Pickle, M.D. [facility] Pulmonary/Critical Care Medicine 11/17/2021 2:15 PM

## 2021-11-17 NOTE — Telephone Encounter (Signed)
Dr. Ellison, please see  mychart message sent by pt and advise. 

## 2021-11-17 NOTE — Telephone Encounter (Signed)
I called the patient and made and OV for this patient with CXR prior to OV. I have placed the order for this patient and she will come in earlier to get CXR. Nothing further needed.

## 2021-11-20 NOTE — Addendum Note (Signed)
Addended by: Lorretta Harp on: 11/20/2021 11:04 AM   Modules accepted: Orders

## 2021-11-24 ENCOUNTER — Other Ambulatory Visit: Payer: Self-pay

## 2021-11-24 ENCOUNTER — Ambulatory Visit (INDEPENDENT_AMBULATORY_CARE_PROVIDER_SITE_OTHER): Payer: BC Managed Care – PPO

## 2021-11-24 ENCOUNTER — Encounter: Payer: Self-pay | Admitting: Pulmonary Disease

## 2021-11-24 ENCOUNTER — Ambulatory Visit: Payer: BC Managed Care – PPO | Admitting: Pulmonary Disease

## 2021-11-24 VITALS — BP 138/90 | HR 90 | Temp 97.8°F | Ht 66.0 in | Wt 242.0 lb

## 2021-11-24 DIAGNOSIS — M7989 Other specified soft tissue disorders: Secondary | ICD-10-CM | POA: Diagnosis not present

## 2021-11-24 DIAGNOSIS — U071 COVID-19: Secondary | ICD-10-CM

## 2021-11-24 DIAGNOSIS — R0602 Shortness of breath: Secondary | ICD-10-CM

## 2021-11-24 MED ORDER — AMOXICILLIN-POT CLAVULANATE 875-125 MG PO TABS: 1.0000 | ORAL_TABLET | Freq: Two times a day (BID) | ORAL | 0 refills | Status: AC

## 2021-11-24 NOTE — Telephone Encounter (Signed)
Mychart message sent by pt: [facility] (supporting Margaretha Seeds, MD) 6 minutes ago (4:40 PM)   I had an appointment with Dr. Loanne Drilling today. First, I want to say thank you for letting me ask all my questions. You are always very helpful. Second, based on the way I feel, I do not want to wait on the echocardiogram. Could you please set that up too. Thanks very much.    Routing to Dr. Loanne Drilling.

## 2021-11-24 NOTE — Patient Instructions (Addendum)
Recent COVID-19 infection with pulmonary opacity/nodule Shortness of breath Deconditioning --START Augmentin for five days --ORDER CT Chest without contrast in 1 month --CONTINUE Albuterol AS NEEDED for shortness of breath or wheezing  Lower extremity swelling - euvolemic on exam --Hold on echo per patient preference. She will discuss echocardiogram with Cardiologist --Can consider LE ultrasound in the future if persistent  Follow-up with me in 1 month

## 2021-11-24 NOTE — Progress Notes (Signed)
Subjective:   PATIENT ID: Ruth Matthews GENDER: female DOB: 12-01-1965, MRN: [id number]   HPI  Chief Complaint  Patient presents with   Follow-up    Chest tightness, back pain feeling better this week     Reason for Visit: Follow-up  Ruth Matthews is a 56 year old female with fibromyalgia, recent PE, hx DVT in 2012 who presents for follow-up.  Synopsis Initially seen for PE in October after a recent long drive from [place] (08/02/21) and COVID vaccination (Oct 11). She was treated for post-op DVT with warfarin x 3 months in 2012. Hematology with negative hypercoag panel at the time. After her PE diagnosis she is now on lifetime anticoagulation per Hematology. She is compliant with Eliquis.   She was seen by her PCP on 09/09/21. She has had ongoing dyspnea on exertion chest discomfort dizziness and fatigue. Today she reports she has improved dyspnea by 80% at rest only feels 60% with exertion compared to baseline with persistent symptoms that have not changed.  Worsens with activity, bending over and lifting objects <10 lbs (cat). Shortness of breath with laying down has been more difficult. Some wheezing at night. Some coughing during the day where she is clearing her throat. Recent nasal congestion. At baseline, she has seasonal allergies and able to exercise everyday for 30 - 60 minutes (iFit: elliptical, walking, hiking). After the PE, she completely stopped activity. She has returned to exercise every other day due to fatigue. Denies any recent viral illnesses. For her allergies she is taking allegra, flonase, neti pot. She reports she has ongoing lower extremity pain that has worsened.  11/24/21 Since her last visit she was diagnosed with COVID on 11/11/2021. I had called her and discussed strategies to improve bleeding including Mucinex, deep breaths, daily activity and prone positioning.  Her oxygen had improved to around 95% with activity but would go to low 90s at  rest. Last week she had chest tightness and back pain that has improved. She feels like her left lung is sometimes having pain with deep breaths. Has some wheezing or coughing. She continues to have chills, no fevers. She has been attempting to exercise daily with an elliptical, bike or walking. She is using iFit to motivate her with exercise. She reports left knee pain and behind on her posterior gluteus.  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 Marlou Porch, Dr. Caryl Comes   Obesity    Ovarian cyst    ruptured   RMSF [facility] spotted fever) 2004   history of    Varicose veins    Ventricular tachycardia    Vitamin D deficiency      Family History  Problem Relation Age of Onset   Hypertension Mother    COPD Mother    Other Father        died from accident, h/o lyme disease   Colon cancer Neg Hx    Esophageal cancer Neg Hx      Social History   Occupational History   Not on file  Tobacco Use   Smoking status: Never   Smokeless tobacco: Never  Vaping Use   Vaping Use: Never used  Substance and Sexual Activity   Alcohol use:  Yes    Alcohol/week: 0.0 standard drinks    Comment: occasionally   Drug use: No   Sexual activity: Yes    Partners: Male    Comment: 1st intercourse-22, partners- 3, married- 30 yrs     Allergies  Allergen Reactions   Latex Itching, Swelling and Rash   Azithromycin     Upset stomach    Garlic Swelling    Tongue swelling and sensation of throat swelling Tongue swelling and sensation of throat swelling    Onion Swelling    Tongue swelling and sensation of throat swelling Tongue swelling and sensation of throat swelling    Other     "whitening toothpaste"  "earring metals," "nickels and metals"    Levaquin [Levofloxacin]       Outpatient Medications Prior to Visit  Medication Sig Dispense Refill   cholecalciferol (VITAMIN D) 400 units/mL SOLN Take by mouth daily at 6 (six) AM.     ELIQUIS 5 MG TABS tablet Take 5 mg by mouth 2 (two) times daily.     EPINEPHrine 0.3 mg/0.3 mL IJ SOAJ injection      fexofenadine (ALLEGRA) 180 MG tablet Take 4 tablets by mouth daily.     fluticasone (FLONASE) 50 MCG/ACT nasal spray Place 1 spray into both nostrils daily.     Multiple Vitamin (MULTIVITAMIN ADULT PO) Take by mouth.     omeprazole (PRILOSEC) 20 MG capsule Take 1 capsule (20 mg total) by mouth 2 (two) times daily. For 4 weeks than decrease to daily 180 capsule 2   Propylene Glycol (SYSTANE BALANCE) 0.6 % SOLN Place 1 drop into both eyes daily as needed (dry eyes).     verapamil (CALAN-SR) 240 MG CR tablet Take 1 tablet (240 mg total) by mouth daily. 90 tablet 2   albuterol (VENTOLIN HFA) 108 (90 Base) MCG/ACT inhaler Inhale 2 puffs into the lungs every 6 (six) hours as needed for wheezing or shortness of breath. (Patient not taking: Reported on 11/24/2021) 8 g 2   No facility-administered medications prior to visit.    Review of Systems  Constitutional:  Negative for chills, diaphoresis, fever, malaise/fatigue and weight loss.  HENT:  Negative for congestion.   Respiratory:  Positive for cough, shortness of breath and wheezing. Negative for hemoptysis and sputum production.   Cardiovascular:  Positive for chest pain. Negative for palpitations and leg swelling.    Objective:   Vitals:   11/24/21 1457  BP: 138/90  Pulse: 90  Temp: 97.8 F (36.6 C)  TempSrc: Oral  SpO2: 98%  Weight: 242 lb (109.8 kg)  Height: 5\' 6"  (1.676 m)   SpO2: 98 % O2 Device: None (Room air)  Physical Exam: General: Well-appearing, no acute distress HENT: [place], AT Eyes: EOMI, no scleral icterus Respiratory: Clear to auscultation bilaterally.  No crackles, wheezing or rales Cardiovascular: RRR, -M/R/G, no  JVD Extremities:-Edema,-tenderness Neuro: AAO x4, CNII-XII grossly intact Psych: Normal mood, normal affect  Data Reviewed:  Imaging: CTA 08/05/21 - Multiple bilateral pulmonary emboli in the upper and lower lobes bilaterally CXR 11/24/2021-no focal consolidation.  Possible nodular opacity in the right lower lung.  PFT: 10/17/2021 FVC 3.77 (101%) FEV1 3.08 (105%) ratio 81 TLC 101% DLCO 101% Interpretation: normal spirometry with no evidence of obstructive or restrictive defect  Labs: CBC    Component Value Date/Time   WBC 5.2 07/24/2020 0912   WBC 6.9 09/20/2019 1230   RBC 5.03 07/24/2020 0912   RBC 5.28 (H) 09/20/2019 1230   HGB 13.7  07/24/2020 0912   HCT 41.3 07/24/2020 0912   PLT 218 07/24/2020 0912   MCV 82 07/24/2020 0912   MCH 27.2 07/24/2020 0912   MCH 26.5 09/20/2019 1230   MCHC 33.2 07/24/2020 0912   MCHC 31.7 09/20/2019 1230   RDW 14.6 07/24/2020 0912   LYMPHSABS 1.3 09/04/2019 0815   MONOABS 0.5 09/04/2019 0815   EOSABS 0.2 09/04/2019 0815   BASOSABS 0.0 09/04/2019 0815   Absolute eos 09/04/19 -200   Assessment & Plan:   Discussion: 56 year old female never smoker with fibromyalgia, recent PE in October 2022 and hx DVT in 2012 who presents for follow-up. Reviewed CXR with right pulmonary opacity. With persistent symptoms will treat with Augmentin. We discussed additional treatment including bronchodilator however she would like to hold for now. Consider ICS/LABA (low-dose Advair) at next visit if albuterol is not sufficient by next month  Recent COVID-19 infection with pulmonary opacity/nodule Shortness of breath Deconditioning --START Augmentin for five days --ORDER CT Chest without contrast in 1 month --CONTINUE Albuterol AS NEEDED for shortness of breath or wheezing  Lower extremity swelling - euvolemic on exam. Continues to have discomfort in LE. Consider repeating LE dopplers if persistent. Low suspicion for recurrent clot on anticoagulation --Hold  on echo per patient preference. She will discuss echocardiogram with Cardiologist ADDENDUM: Patient requesting echo   Pulmonary embolism --CONTINUE anticoagulation --CONTINUE albuterol inhaler. Take two puffs before activity and before bedtime for wheezing and shortness of breath  Health Maintenance Immunization History  Administered Date(s) Administered   Influenza Inj Mdck Quad Pf 07/27/2019   Influenza, High Dose Seasonal PF 01/03/2020   Influenza,inj,Quad PF,6+ Mos 08/12/2018   PFIZER(Purple Top)SARS-COV-2 Vaccination 12/18/2019, 01/03/2020   Pfizer Covid-19 Vaccine Bivalent Booster 78yrs & up 07/29/2021   Tdap 07/21/2021   CT Lung Screen - not indicated  Orders Placed This Encounter  Procedures   ECHOCARDIOGRAM COMPLETE    Standing Status:   Future    Standing Expiration Date:   11/24/2022    Order Specific Question:   Where should this test be performed    Answer:   [facility]    Order Specific Question:   Perflutren DEFINITY (image enhancing agent) should be administered unless hypersensitivity or allergy exist    Answer:   Administer Perflutren    Order Specific Question:   Is a special reader required? (athlete or structural heart)    Answer:   No    Order Specific Question:   Does this study need to be read by the Structural team/Level 3 readers?    Answer:   No    Order Specific Question:   Reason for exam-Echo    Answer:   Other-Full Diagnosis List    Order Specific Question:   Full ICD-10/Reason for Exam    Answer:   Shortness of breath [786.05.ICD-9-CM]    Order Specific Question:   Release to patient    Answer:   Immediate   Meds ordered this encounter  Medications   amoxicillin-clavulanate (AUGMENTIN) 875-125 MG tablet    Sig: Take 1 tablet by mouth 2 (two) times daily for 5 days.    Dispense:  10 tablet    Refill:  0    Return in about 1 month (around 12/22/2021).   I have spent a total time of 32-minutes on the day of the appointment reviewing  prior documentation, coordinating care and discussing medical diagnosis and plan with the patient/family. Past medical history, allergies, medications were reviewed. Pertinent imaging, labs  and tests included in this note have been reviewed and interpreted independently by me.  [place], MD [facility] 11/24/2021 3:11 PM  Office Number [phone]

## 2021-12-01 ENCOUNTER — Ambulatory Visit (INDEPENDENT_AMBULATORY_CARE_PROVIDER_SITE_OTHER): Payer: BC Managed Care – PPO

## 2021-12-01 ENCOUNTER — Ambulatory Visit: Payer: BC Managed Care – PPO | Admitting: Pulmonary Disease

## 2021-12-01 ENCOUNTER — Other Ambulatory Visit: Payer: Self-pay

## 2021-12-01 DIAGNOSIS — R0602 Shortness of breath: Secondary | ICD-10-CM | POA: Diagnosis not present

## 2021-12-01 LAB — ECHOCARDIOGRAM COMPLETE
AR max vel: 2.61 cm2
AV Area VTI: 2.52 cm2
AV Area mean vel: 2.52 cm2
AV Mean grad: 3 mmHg
AV Peak grad: 6.5 mmHg
Ao pk vel: 1.27 m/s
Area-P 1/2: 4.17 cm2
Calc EF: 58.5 %
S' Lateral: 2.89 cm
Single Plane A2C EF: 56.6 %
Single Plane A4C EF: 59.5 %

## 2021-12-03 ENCOUNTER — Telehealth: Payer: Self-pay | Admitting: Pulmonary Disease

## 2021-12-03 DIAGNOSIS — R0602 Shortness of breath: Secondary | ICD-10-CM

## 2021-12-04 NOTE — Telephone Encounter (Signed)
Called patient and she stated that she wanted a CT scan ordered. I looked at the last office note and advised patient that only an echocardiogram was ordered last visit. I informed patient that I would have to get the approval from Dr Everardo All if she wants a CT scan or not and that when we got a reply we would give her a call back  Dr Everardo All please advise

## 2021-12-05 NOTE — Telephone Encounter (Signed)
Patient is correct. Last note on 11/24/21 shows that CT without contrast was supposed to be ordered.   Please order CT without contrast within one week of our next appointment on 12/23/21.  Thank you, JE

## 2021-12-05 NOTE — Telephone Encounter (Signed)
Order placed for pt's CT. Called and spoke with pt letting her know this info and she verbalized understanding. Nothing further needed.

## 2021-12-17 ENCOUNTER — Other Ambulatory Visit: Payer: Self-pay

## 2021-12-17 ENCOUNTER — Ambulatory Visit (INDEPENDENT_AMBULATORY_CARE_PROVIDER_SITE_OTHER)
Admission: RE | Admit: 2021-12-17 | Discharge: 2021-12-17 | Disposition: A | Discharge status: Home / Self Care | Payer: BC Managed Care – PPO | Source: Ambulatory Visit | Attending: Pulmonary Disease | Admitting: Pulmonary Disease

## 2021-12-17 DIAGNOSIS — R0602 Shortness of breath: Secondary | ICD-10-CM

## 2021-12-23 ENCOUNTER — Encounter: Payer: Self-pay | Admitting: Pulmonary Disease

## 2021-12-23 ENCOUNTER — Other Ambulatory Visit: Payer: Self-pay

## 2021-12-23 ENCOUNTER — Ambulatory Visit: Payer: BC Managed Care – PPO | Admitting: Pulmonary Disease

## 2021-12-23 VITALS — BP 118/70 | HR 78 | Ht 66.0 in | Wt 250.4 lb

## 2021-12-23 DIAGNOSIS — R0602 Shortness of breath: Secondary | ICD-10-CM | POA: Diagnosis not present

## 2021-12-23 DIAGNOSIS — M7989 Other specified soft tissue disorders: Secondary | ICD-10-CM | POA: Diagnosis not present

## 2021-12-23 NOTE — Progress Notes (Signed)
Subjective:   PATIENT ID: Ruth Matthews GENDER: female DOB: 03-Dec-1965, MRN: [id number]   HPI  Chief Complaint  Patient presents with   Follow-up    Feels better, antibiotic helped more to clear stuff up    Reason for Visit: Follow-up  Ms. Norena Goodine is a 56 year old female with fibromyalgia, recent PE, hx DVT in 2012 who presents for follow-up.  Synopsis Initially seen for PE in October after a recent long drive from [place] (08/02/21) and COVID vaccination (Oct 11). She was treated for post-op DVT with warfarin x 3 months in 2012. Hematology with negative hypercoag panel at the time. After her PE diagnosis she is now on lifetime anticoagulation per Hematology. She is compliant with Eliquis.   She was seen by her PCP on 09/09/21. She has had ongoing dyspnea on exertion chest discomfort dizziness and fatigue. Today she reports she has improved dyspnea by 80% at rest only feels 60% with exertion compared to baseline with persistent symptoms that have not changed.  Worsens with activity, bending over and lifting objects <10 lbs (cat). Shortness of breath with laying down has been more difficult. Some wheezing at night. Some coughing during the day where she is clearing her throat. Recent nasal congestion. At baseline, she has seasonal allergies and able to exercise everyday for 30 - 60 minutes (iFit: elliptical, walking, hiking). After the PE, she completely stopped activity. She has returned to exercise every other day due to fatigue. Denies any recent viral illnesses. For her allergies she is taking allegra, flonase, neti pot. She reports she has ongoing lower extremity pain that has worsened.  11/24/21 Since her last visit she was diagnosed with COVID on 11/11/2021. I had called her and discussed strategies to improve bleeding including Mucinex, deep breaths, daily activity and prone positioning.  Her oxygen had improved to around 95% with activity but would go to low 90s at  rest. Last week she had chest tightness and back pain that has improved. She feels like her left lung is sometimes having pain with deep breaths. Has some wheezing or coughing. She continues to have chills, no fevers. She has been attempting to exercise daily with an elliptical, bike or walking. She is using iFit to motivate her with exercise. She reports left knee pain and behind on her posterior gluteus.  12/23/21 Since her last visit she was treated with a course of Augmentin with a CT chest follow-up and echocardiogram for further evaluation.  She tried using her albuterol twice a day but stopped recently with no change in her shortness of breath. Exercise is still not to the level she was before. She continues to have lower extremity swelling that worsens with periods of sitting and standing.  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 Marlou Porch, Dr. Caryl Comes   Obesity    Ovarian cyst    ruptured   RMSF [facility] spotted fever) 2004   history of    Varicose veins    Ventricular tachycardia    Vitamin D deficiency      Family History  Problem Relation Age of Onset   Hypertension Mother    COPD Mother    Other Father  died from accident, h/o lyme disease   Colon cancer Neg Hx    Esophageal cancer Neg Hx      Social History   Occupational History   Not on file  Tobacco Use   Smoking status: Never   Smokeless tobacco: Never  Vaping Use   Vaping Use: Never used  Substance and Sexual Activity   Alcohol use: Yes    Alcohol/week: 0.0 standard drinks    Comment: occasionally   Drug use: No   Sexual activity: Yes    Partners: Male    Comment: 1st intercourse-22, partners- 3, married- 30 yrs     Allergies  Allergen Reactions   Latex Itching, Swelling  and Rash   Azithromycin     Upset stomach    Garlic Swelling    Tongue swelling and sensation of throat swelling Tongue swelling and sensation of throat swelling    Onion Swelling    Tongue swelling and sensation of throat swelling Tongue swelling and sensation of throat swelling    Other     "whitening toothpaste"  "earring metals," "nickels and metals"    Levaquin [Levofloxacin]      Outpatient Medications Prior to Visit  Medication Sig Dispense Refill   albuterol (VENTOLIN HFA) 108 (90 Base) MCG/ACT inhaler Inhale 2 puffs into the lungs every 6 (six) hours as needed for wheezing or shortness of breath. (Patient not taking: Reported on 11/24/2021) 8 g 2   cholecalciferol (VITAMIN D) 400 units/mL SOLN Take by mouth daily at 6 (six) AM.     ELIQUIS 5 MG TABS tablet Take 5 mg by mouth 2 (two) times daily.     EPINEPHrine 0.3 mg/0.3 mL IJ SOAJ injection      fexofenadine (ALLEGRA) 180 MG tablet Take 4 tablets by mouth daily.     fluticasone (FLONASE) 50 MCG/ACT nasal spray Place 1 spray into both nostrils daily.     Multiple Vitamin (MULTIVITAMIN ADULT PO) Take by mouth.     omeprazole (PRILOSEC) 20 MG capsule Take 1 capsule (20 mg total) by mouth 2 (two) times daily. For 4 weeks than decrease to daily 180 capsule 2   Propylene Glycol (SYSTANE BALANCE) 0.6 % SOLN Place 1 drop into both eyes daily as needed (dry eyes).     verapamil (CALAN-SR) 240 MG CR tablet Take 1 tablet (240 mg total) by mouth daily. 90 tablet 2   No facility-administered medications prior to visit.    Review of Systems  Constitutional:  Negative for chills, diaphoresis, fever, malaise/fatigue and weight loss.  HENT:  Negative for congestion.   Respiratory:  Positive for shortness of breath. Negative for cough, hemoptysis, sputum production and wheezing.   Cardiovascular:  Positive for leg swelling. Negative for chest pain and palpitations.    Objective:   Vitals:   12/23/21 0906  BP: 118/70  Pulse: 78   SpO2: 97%  Weight: 250 lb 6.4 oz (113.6 kg)  Height: 5\' 6"  (1.676 m)  Body mass index is 40.42 kg/m.    Physical Exam: General: Well-appearing, no acute distress HENT: [place], AT Eyes: EOMI, no scleral icterus Respiratory: Clear to auscultation bilaterally.  No crackles, wheezing or rales Cardiovascular: RRR, -M/R/G, no JVD Extremities: Mild non-pitting edema,-tenderness Neuro: AAO x4, CNII-XII grossly intact Psych: Normal mood, normal affect  Data Reviewed:  Imaging: CTA 08/05/21 - Multiple bilateral pulmonary emboli in the upper and lower lobes bilaterally CXR 11/24/2021-no focal consolidation.  Possible nodular opacity in the right lower lung. CT chest 12/17/2021-No pulmonary  nodule noted. No significant GGO, septal thickening, subpleural reticulation, traction bronchiectasis or honeycombing present.  PFT: 10/17/2021 FVC 3.77 (101%) FEV1 3.08 (105%) ratio 81 TLC 101% DLCO 101% Interpretation: normal spirometry with no evidence of obstructive or restrictive defect  Echo: 12/01/2021-EF 55 to 60%. No WMA.  Normal diastolic parameters.  RV function and size is normal.  PASP no valvular abnormalities  Labs: CBC    Component Value Date/Time   WBC 5.2 07/24/2020 0912   WBC 6.9 09/20/2019 1230   RBC 5.03 07/24/2020 0912   RBC 5.28 (H) 09/20/2019 1230   HGB 13.7 07/24/2020 0912   HCT 41.3 07/24/2020 0912   PLT 218 07/24/2020 0912   MCV 82 07/24/2020 0912   MCH 27.2 07/24/2020 0912   MCH 26.5 09/20/2019 1230   MCHC 33.2 07/24/2020 0912   MCHC 31.7 09/20/2019 1230   RDW 14.6 07/24/2020 0912   LYMPHSABS 1.3 09/04/2019 0815   MONOABS 0.5 09/04/2019 0815   EOSABS 0.2 09/04/2019 0815   BASOSABS 0.0 09/04/2019 0815   Absolute eos 09/04/19 -200   Assessment & Plan:   Discussion: 56 year old female never smoker with fibromyalgia, recent PE in October 2022 and history of DVT in 2012 who presents for follow-up.  After completing her course of antibiotics follow-up CT was obtained.   We reviewed the CT and echocardiogram together which demonstrated no parenchymal abnormalities. Prior CT findings were likely related to infectious/inflammatory etiology. Her work-up for dyspnea is unrevealing. Suspect this is due to deconditioning since her PE and subsequent COVID diagnosis. We discussed regular aerobic activity and weight maintenance/loss.   Recent COVID-19 infection with pulmonary opacity/nodule - resolved Shortness of breath Deconditioning --CONTINUE Albuterol AS NEEDED for shortness of breath or wheezing  Lower extremity swelling - euvolemic on exam --Normal echo  --Encourage regular exercise frequent activity to minimize edema  Pulmonary embolism --CONTINUE anticoagulation --CONTINUE albuterol inhaler. Take two puffs before activity and before bedtime for wheezing and shortness of breath  Health Maintenance Immunization History  Administered Date(s) Administered   Influenza Inj Mdck Quad Pf 07/27/2019   Influenza, High Dose Seasonal PF 01/03/2020   Influenza,inj,Quad PF,6+ Mos 08/12/2018   PFIZER(Purple Top)SARS-COV-2 Vaccination 12/18/2019, 01/03/2020   Pfizer Covid-19 Vaccine Bivalent Booster 97yrs & up 07/29/2021   Tdap 07/21/2021   CT Lung Screen - not indicated  No orders of the defined types were placed in this encounter.  No orders of the defined types were placed in this encounter.   Return if symptoms worsen or fail to improve.   I have spent a total time of 32-minutes on the day of the appointment reviewing prior documentation, coordinating care and discussing medical diagnosis and plan with the patient/family. Past medical history, allergies, medications were reviewed. Pertinent imaging, labs and tests included in this note have been reviewed and interpreted independently by me.  [place], MD [facility] 12/23/2021 8:01 AM  Office Number [phone]

## 2021-12-23 NOTE — Patient Instructions (Signed)
Shortness of breath ?Deconditioning  ?Good news! Your CT scan and echocardiogram are normal ?Keep up with exercise and portion control ? ?Follow-up with me as needed ?

## 2021-12-24 ENCOUNTER — Encounter: Payer: Self-pay | Admitting: Pulmonary Disease

## 2022-01-11 NOTE — Progress Notes (Unsigned)
?Cardiology Office Note:   ? ?Date:  01/11/2022  ? ?ID:  Alphonse Guild, DOB 01/30/1966, MRN [id number] ? ?PCP:  Katherina Mires, MD ?  ?[place]  ?Cardiologist:  Freada Bergeron, MD  ?Advanced Practice Provider:  No care team member to display ?Electrophysiologist:  None  ? ? ?Referring MD: Katherina Mires, MD  ? ? ?History of Present Illness:   ? ?Ruth Matthews is a 56 y.o. female with a hx of DVT, HTN, fascicular VT and HLD who was previously followed by Dr. Meda Coffee who now returns to clinic for follow-up. ? ?She was previously seen by Dr Marlou Porch for palpitations and exertional presyncope at a gym in 2012. She was diagnosed with a wide complex tachycardia negatively deflecting QRS complexes in the lateral leads as well as right axis deviation and negative deflection in II, III and aVF. She was given an IV and amiodarone and converted after about 6 minutes. Briefly she was in atrial fibrillation postconversion and then remained in sinus rhythm for the hospitalization. Dr. Caryl Comes recommended verapamil for treatment of this left fascicular VT. Cardiac MRI was unremarkable. Echocardiogram unremarkable. Cardiac catheterization unremarkable, no CAD. ? ?Saw Dr. Meda Coffee on 07/24/2020 where she was feeling more fatigue and SOB with exertion. Also was having recurrent palpitations with exertion. TTE obtained on 01/06/21 showed normal BiV function and no significant valve disease. She was also recommended for coronary CTA but she cancelled as well as an exercise tolerance test which was not completed. ? ?Repeat TTE 11/2021 with LVEF 55-60%, no WMA, normal RV, mild LAE, no significant valve disease. Cardiac monitor 10/2021 with rare SVE, PVCs <1%, no sustained arrhythmias.  ? ?Today, *** ? ?Past Medical History:  ?Diagnosis Date  ? Anxiety   ? DVT (deep venous thrombosis) ([place])   ? Elevated fasting blood sugar   ? Gallstone   ? Gestational diabetes 2000  ? Hypertension   ? Left anterior  fascicular block   ? 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 Marlou Porch, Dr. Caryl Comes  ? Obesity   ? Ovarian cyst   ? ruptured  ? RMS[facility] spotted fever) 2004  ? history of   ? Varicose veins   ? Ventricular tachycardia   ? Vitamin D deficiency   ? ? ?Past Surgical History:  ?Procedure Laterality Date  ? CARDIAC CATHETERIZATION    ? no CAD  ? CESAREAN SECTION    ? CHOLECYSTECTOMY    ? ENDOVENOUS ABLATION SAPHENOUS VEIN W/ LASER Bilateral 02/2011  ? Both legs a few weeks apart  ? LIVER BIOPSY    ? ? ?Current Medications: ?No outpatient medications have been marked as taking for the 01/21/22 encounter (Appointment) with Freada Bergeron, MD.  ?  ? ?Allergies:   Latex, Azithromycin, Garlic, Onion, Other, and Levaquin [levofloxacin]  ? ?Social History  ? ?Socioeconomic History  ? Marital status: Married  ?  Spouse name: Not on file  ? Number of children: Not on file  ? Years of education: Not on file  ? Highest education level: Not on file  ?Occupational History  ? Not on file  ?Tobacco Use  ? Smoking status: Never  ? Smokeless tobacco: Never  ?Vaping Use  ? Vaping Use: Never used  ?Substance and Sexual Activity  ? Alcohol use: Yes  ?  Alcohol/week: 0.0 standard drinks  ?  Comment: occasionally  ? Drug use: No  ? Sexual activity: Yes  ?  Partners: Male  ?  Comment: 1st intercourse-22,  partners- 12, married- 11 yrs   ?Other Topics Concern  ? Not on file  ?Social History Narrative  ? Not on file  ? ?Social Determinants of Health  ? ?Financial Resource Strain: Not on file  ?Food Insecurity: Not on file  ?Transportation Needs: Not on file  ?Physical Activity: Not on file  ?Stress: Not on file  ?Social Connections: Not on file  ?  ? ?Family History: ?The patient's family history includes COPD in her mother; Hypertension in her mother; Other in her father. There is no history of Colon cancer or Esophageal cancer. ? ?ROS:   ?Please see the history of present illness.    ?Review of Systems  ?Constitutional:  Positive for weight loss.  Negative for chills, diaphoresis and fever.  ?HENT:  Negative for hearing loss.   ?Eyes:  Negative for blurred vision and redness.  ?Respiratory:  Negative for shortness of breath.   ?Cardiovascular:  Positive for palpitations. Negative for chest pain, orthopnea, claudication, leg swelling and PND.  ?Gastrointestinal:  Negative for melena, nausea and vomiting.  ?Genitourinary:  Negative for dysuria and flank pain.  ?Musculoskeletal:  Negative for falls and myalgias.  ?Neurological:  Negative for dizziness and loss of consciousness.  ?Endo/Heme/Allergies:  Negative for polydipsia.  ?Psychiatric/Behavioral:  Negative for substance abuse.   ? ?EKGs/Labs/Other Studies Reviewed:   ? ?The following studies were reviewed today: ?TTE 11/2021: ?IMPRESSIONS  ? 1. Left ventricular ejection fraction, by estimation, is 55 to 60%. Left  ?ventricular ejection fraction by 2D MOD biplane is 58.5 %. Left  ?ventricular ejection fraction by PLAX is 57 %. The left ventricle has  ?normal function. The left ventricle has no  ?regional wall motion abnormalities. Left ventricular diastolic parameters  ?were normal.  ? 2. Right ventricular systolic function is normal. The right ventricular  ?size is normal. There is normal pulmonary artery systolic pressure.  ? 3. Left atrial size was mildly dilated.  ? 4. The mitral valve is normal in structure. No evidence of mitral valve  ?regurgitation. No evidence of mitral stenosis.  ? 5. The aortic valve is tricuspid. Aortic valve regurgitation is not  ?visualized. No aortic stenosis is present.  ? 6. The inferior vena cava is normal in size with <50% respiratory  ?variability, suggesting right atrial pressure of 8 mmHg.  ? ?Comparison(s): EF 60%. ? ?Cardiac Monitor 10/2021: ?Patch wear time was 13 days and 15 hours ?Predominant rhythm was NSR with average HR 76bpm ?Rare SVE and rare PVCs (<1%) ?Patient triggered events correlate with sinus rhythm ?No sustained arrhythmias or significant pauses ?  ?   ?Patch Wear Time:  13 days and 15 hours (2022-12-16T15:55:56-0500 to 2022-12-30T07:38:22-0500) ?  ?Patient had a min HR of 45 bpm, max HR of 177 bpm, and avg HR of 76 bpm. Predominant underlying rhythm was Sinus Rhythm. Isolated SVEs were rare (<1.0%), SVE Couplets were rare (<1.0%), and SVE Triplets were rare (<1.0%). Isolated VEs were rare (<1.0%,  ?4358), VE Triplets were rare (<1.0%, 2), and no VE Couplets were present. Ventricular Trigeminy was present.  ?TTE 01/06/21: ?IMPRESSIONS  ? 1. Left ventricular ejection fraction, by estimation, is 60 to 65%. Left  ?ventricular ejection fraction by 3D volume is 60 %. The left ventricle has  ?normal function. The left ventricle has no regional wall motion  ?abnormalities. Left ventricular diastolic  ? parameters were normal.  ? 2. Right ventricular systolic function is normal. The right ventricular  ?size is normal. There is normal pulmonary artery systolic pressure.  ?  3. The mitral valve is normal in structure. No evidence of mitral valve  ?regurgitation. No evidence of mitral stenosis.  ? 4. The aortic valve is normal in structure. Aortic valve regurgitation is  ?not visualized. No aortic stenosis is present.  ? 5. The inferior vena cava is normal in size with greater than 50%  ?respiratory variability, suggesting right atrial pressure of 3 mmHg.  ? ?Cardiac MR: ?Findings:  All 4 cardiac chambers were normal in size and function.  ?There was no ASD, VSD or pericardial effusion.  Mitral, aortic and  ?tricuspid valves appeared morphologically normal.  Quantitative EF  ?was 61% ( EDV-119cc, ESV-47cc, SV 72cc) Hyperenhancement images  ?showed no infiltration or scar tissue in the LV  ?   ?Impression:  ?   ?   1)    Normal cardiac MRI  ?        ?2)    EF 61% with no RWMA's  ?3)    No infiltration or hyperenhancement in LV  ? ? ?Recent Labs: ?No results found for requested labs within last 8760 hours.  ?Recent Lipid Panel ?   ?Component Value Date/Time  ? CHOL 190  07/24/2020 0912  ? TRIG 132 07/24/2020 0912  ? HDL 76 07/24/2020 0912  ? CHOLHDL 2.5 07/24/2020 0912  ? CHOLHDL 3.8 10/29/2010 1019  ? VLDL 24 10/29/2010 1019  ? [place] 91 07/24/2020 0912  ? ? ? ? ?Physical Exam:   ?

## 2022-01-21 ENCOUNTER — Ambulatory Visit (HOSPITAL_COMMUNITY)
Admission: RE | Admit: 2022-01-21 | Discharge: 2022-01-21 | Disposition: A | Discharge status: Home / Self Care | Payer: BC Managed Care – PPO | Source: Ambulatory Visit | Attending: Cardiovascular Disease | Admitting: Cardiovascular Disease

## 2022-01-21 ENCOUNTER — Encounter: Payer: Self-pay | Admitting: Cardiology

## 2022-01-21 ENCOUNTER — Ambulatory Visit: Payer: BC Managed Care – PPO | Admitting: Cardiology

## 2022-01-21 VITALS — BP 134/84 | HR 76 | Ht 66.0 in | Wt 250.0 lb

## 2022-01-21 DIAGNOSIS — Z86711 Personal history of pulmonary embolism: Secondary | ICD-10-CM

## 2022-01-21 DIAGNOSIS — R0609 Other forms of dyspnea: Secondary | ICD-10-CM

## 2022-01-21 DIAGNOSIS — M79604 Pain in right leg: Secondary | ICD-10-CM | POA: Insufficient documentation

## 2022-01-21 DIAGNOSIS — M7989 Other specified soft tissue disorders: Secondary | ICD-10-CM | POA: Insufficient documentation

## 2022-01-21 DIAGNOSIS — I4729 Other ventricular tachycardia: Secondary | ICD-10-CM

## 2022-01-21 DIAGNOSIS — Z86718 Personal history of other venous thrombosis and embolism: Secondary | ICD-10-CM

## 2022-01-21 DIAGNOSIS — E669 Obesity, unspecified: Secondary | ICD-10-CM

## 2022-01-21 DIAGNOSIS — M79605 Pain in left leg: Secondary | ICD-10-CM

## 2022-01-21 DIAGNOSIS — E785 Hyperlipidemia, unspecified: Secondary | ICD-10-CM

## 2022-01-21 NOTE — Progress Notes (Signed)
?Cardiology Office Note:   ? ?Date:  01/21/2022  ? ?ID:  Ruth Matthews, DOB [birth year], MRN [id number] ? ?PCP:  Cathlean Sauer, MD ?  ?[place]  ?Cardiologist:  Freada Bergeron, MD  ?Advanced Practice Provider:  No care team member to display ?Electrophysiologist:  None  ? ? ?Referring MD: Katherina Mires, MD  ? ? ?History of Present Illness:   ? ?Ruth Matthews is a 56 y.o. female with a hx of DVT, HTN, fascicular VT and HLD who was previously followed by Dr. Meda Coffee who now returns to clinic for follow-up. ? ?She was previously seen by Dr Marlou Porch for palpitations and exertional presyncope at a gym in 2012. She was diagnosed with a wide complex tachycardia negatively deflecting QRS complexes in the lateral leads as well as right axis deviation and negative deflection in II, III and aVF. She was given an IV and amiodarone and converted after about 6 minutes. Briefly she was in atrial fibrillation postconversion and then remained in sinus rhythm for the hospitalization. Dr. Caryl Comes recommended verapamil for treatment of this left fascicular VT. Cardiac MRI was unremarkable. Echocardiogram unremarkable. Cardiac catheterization unremarkable, no CAD. ? ?Saw Dr. Meda Coffee on 07/24/2020 where she was feeling more fatigue and SOB with exertion. Also was having recurrent palpitations with exertion. TTE obtained on 01/06/21 showed normal BiV function and no significant valve disease. She was also recommended for coronary CTA but she cancelled as well as an exercise tolerance test which was not completed. ? ?Repeat TTE 11/2021 with LVEF 55-60%, no WMA, normal RV, mild LAE, no significant valve disease. Cardiac monitor 10/2021 with rare SVE, PVCs <1%, no sustained arrhythmias.  ? ?Today, she is not doing the best. Last October, she received the COVID vaccine and developed a pulmonary embolism. During the PE, her blood pressure was 80/40 and she lost consciousness. She was managed with  anticoagulation alone and was started on apxiaban. Following her PE, she had a prolonged recovery with persistent dizziness and SOB. This has gradually improved, however, she got COVID in 10/2021 and had some worsening SOB at that time. Overall, this has improved and she has been exercising and taking her eliquis without missing doses.  ? ?She is concerned today, however, that she may have new blood clots in her legs. She endorses a sharp, cramp-like pain in her upper medial R thigh. The pain is consistent and is worse than her R leg. She also reports sharp intermittent pain in her posterior L thigh and a shooting L-sided chest pain. The chest pain started 2 to 3 weeks ago and can occur regardless of exertion. She is wondering if she is not responding to the apixaban. ? ?Otherwise, she remains active and exercises regularly. For exercise, she walks, uses the elliptical and recumbent bike, and yoga. Has some mild DOE, but she denies chest pressure, palpitations, PND, orthopnea, or syncope. ? ?Past Medical History:  ?Diagnosis Date  ? Anxiety   ? DVT (deep venous thrombosis) ([place])   ? Elevated fasting blood sugar   ? Gallstone   ? Gestational diabetes 2000  ? Hypertension   ? Left anterior fascicular block   ? 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 Marlou Porch, Dr. Caryl Comes  ? Obesity   ? Ovarian cyst   ? ruptured  ? RMSF [facility] spotted fever) 2004  ? history of   ? Varicose veins   ? Ventricular tachycardia ([place])   ? Vitamin D deficiency   ? ? ?Past Surgical History:  ?  Procedure Laterality Date  ? CARDIAC CATHETERIZATION    ? no CAD  ? CESAREAN SECTION    ? CHOLECYSTECTOMY    ? ENDOVENOUS ABLATION SAPHENOUS VEIN W/ LASER Bilateral 02/2011  ? Both legs a few weeks apart  ? LIVER BIOPSY    ? ? ?Current Medications: ?Current Meds  ?Medication Sig  ? albuterol (VENTOLIN HFA) 108 (90 Base) MCG/ACT inhaler Inhale 2 puffs into the lungs every 6 (six) hours as needed for wheezing or shortness of breath.  ? cholecalciferol (VITAMIN D) 400  units/mL SOLN Take by mouth daily at 6 (six) AM.  ? ELIQUIS 5 MG TABS tablet Take 5 mg by mouth 2 (two) times daily.  ? EPINEPHrine 0.3 mg/0.3 mL IJ SOAJ injection   ? fexofenadine (ALLEGRA) 180 MG tablet Take 4 tablets by mouth daily.  ? fluticasone (FLONASE) 50 MCG/ACT nasal spray Place 1 spray into both nostrils daily.  ? Multiple Vitamin (MULTIVITAMIN ADULT PO) Take by mouth.  ? omeprazole (PRILOSEC) 20 MG capsule Take 1 capsule (20 mg total) by mouth 2 (two) times daily. For 4 weeks than decrease to daily  ? Propylene Glycol (SYSTANE BALANCE) 0.6 % SOLN Place 1 drop into both eyes daily as needed (dry eyes).  ? verapamil (CALAN-SR) 240 MG CR tablet Take 1 tablet (240 mg total) by mouth daily.  ?  ? ?Allergies:   Latex, Azithromycin, Garlic, Mixed grasses, Molds & smuts, Onion, Other, and Levaquin [levofloxacin]  ? ?Social History  ? ?Socioeconomic History  ? Marital status: Married  ?  Spouse name: Not on file  ? Number of children: Not on file  ? Years of education: Not on file  ? Highest education level: Not on file  ?Occupational History  ? Not on file  ?Tobacco Use  ? Smoking status: Never  ? Smokeless tobacco: Never  ?Vaping Use  ? Vaping Use: Never used  ?Substance and Sexual Activity  ? Alcohol use: Yes  ?  Alcohol/week: 0.0 standard drinks  ?  Comment: occasionally  ? Drug use: No  ? Sexual activity: Yes  ?  Partners: Male  ?  Comment: 1st intercourse-22, partners- 48, married- 49 yrs   ?Other Topics Concern  ? Not on file  ?Social History Narrative  ? Not on file  ? ?Social Determinants of Health  ? ?Financial Resource Strain: Not on file  ?Food Insecurity: Not on file  ?Transportation Needs: Not on file  ?Physical Activity: Not on file  ?Stress: Not on file  ?Social Connections: Not on file  ?  ? ?Family History: ?The patient's family history includes COPD in her mother; Hypertension in her mother; Other in her father. There is no history of Colon cancer or Esophageal cancer. ? ?ROS:   ?Please see the  history of present illness.    ?Review of Systems  ?Constitutional:  Negative for chills, fever and weight loss.  ?HENT:  Negative for hearing loss.   ?Eyes:  Negative for blurred vision and redness.  ?Respiratory:  Negative for hemoptysis and sputum production.   ?Cardiovascular:  Positive for chest pain (L-sided). Negative for palpitations, orthopnea, claudication, leg swelling and PND.  ?Gastrointestinal:  Negative for melena, nausea and vomiting.  ?Genitourinary:  Negative for dysuria and flank pain.  ?Musculoskeletal:  Positive for myalgias (medial R thigh, posterior L thigh). Negative for falls.  ?Skin:  Negative for itching and rash.  ?Neurological:  Negative for dizziness and loss of consciousness.  ?Endo/Heme/Allergies:  Negative for polydipsia.  ?Psychiatric/Behavioral:  Negative for substance abuse.   ? ?EKGs/Labs/Other Studies Reviewed:   ? ?The following studies were reviewed today: ?TTE 11/2021: ?IMPRESSIONS  ? 1. Left ventricular ejection fraction, by estimation, is 55 to 60%. Left  ?ventricular ejection fraction by 2D MOD biplane is 58.5 %. Left  ?ventricular ejection fraction by PLAX is 57 %. The left ventricle has  ?normal function. The left ventricle has no  ?regional wall motion abnormalities. Left ventricular diastolic parameters  ?were normal.  ? 2. Right ventricular systolic function is normal. The right ventricular  ?size is normal. There is normal pulmonary artery systolic pressure.  ? 3. Left atrial size was mildly dilated.  ? 4. The mitral valve is normal in structure. No evidence of mitral valve  ?regurgitation. No evidence of mitral stenosis.  ? 5. The aortic valve is tricuspid. Aortic valve regurgitation is not  ?visualized. No aortic stenosis is present.  ? 6. The inferior vena cava is normal in size with <50% respiratory  ?variability, suggesting right atrial pressure of 8 mmHg.  ? ?Comparison(s): EF 60%. ? ?Cardiac Monitor 10/2021: ?Patch wear time was 13 days and 15  hours ?Predominant rhythm was NSR with average HR 76bpm ?Rare SVE and rare PVCs (<1%) ?Patient triggered events correlate with sinus rhythm ?No sustained arrhythmias or significant pauses ?  ?  ?Patch Wear Time:  13 days and 15 h

## 2022-01-21 NOTE — Patient Instructions (Signed)
Medication Instructions:  ? ?Your physician recommends that you continue on your current medications as directed. Please refer to the Current Medication list given to you today. ? ?*If you need a refill on your cardiac medications before your next appointment, please call your pharmacy* ? ? ?Testing/Procedures: ? ?Your physician has requested that you have a lower extremity venous duplex. This test is an ultrasound of the veins in the legs or arms. It looks at venous blood flow that carries blood from the heart to the legs or arms. Allow one hour for a Lower Venous exam. Allow thirty minutes for an Upper Venous exam. There are no restrictions or special instructions. ? ? ?Follow-Up: ? ?3 MONTHS WITH AN EXTENDER IN THE OFFICE ? ? ?

## 2022-04-21 NOTE — Progress Notes (Unsigned)
Cardiology Office Note:    Date:  04/22/2022   ID:  Ruth Matthews, Ruth Matthews 1966/02/03, MRN [id number]  PCP:  Ruth Carson, MD  Ruth Matthews Cardiologist:  Ruth Sprague, MD     Referring MD: Ruth Mis, MD   Chief Complaint:  F/u for VT    Patient Profile: Paroxysmal VT L Fascicular Ventricular Tachycardia (eval by Ruth Matthews in the past) Verapamil Rx Hx of DVT Hx of pulmonary embolism in 07/2021 (after long drive and COVID vaccine) Lifelong anticoagulation  Hypertension  Hyperlipidemia  Gestational diabetes   Prior CV Studies: Venous duplex 01/21/22 No evidence of DVT bilaterally or superficial venous thrombosis bilaterally  ECHO COMPLETE WO IMAGING ENHANCING AGENT 12/01/2021 EF 55-60, no RWMA, normal RVSF, normal PASP (RVSP 34.4), mild LAE   LONG TERM MONITOR (8-14 DAYS) HOOK UP AND INTERPRETATION 10/23/2021 NSR, average heart rate 76, rare PACs/PVCs; no sustained arrhythmias or significant pauses  CARDIAC MRI 10/30/2010 Impression:  1)    Normal cardiac MRI  2)    EF 61% with no RWMA's  3)    No infiltration or hyperenhancement in LV   CARDIAC CATHETERIZATION 10/30/2010 IMPRESSION: 1. No angiographically apparent coronary artery disease. 2. Normal ventricular function. 3. Normal hemodynamics.   History of Present Illness:   Ruth Matthews is a 56 y.o. female with the above problem list.  She was last seen by Dr. Shari Matthews in April 2023.  She was having leg pain and chest pain.   Venous duplex was neg for DVT.  Plan was to pursue ETT if recurrent symptoms.  She returns for f/u.    She is here alone.  She has been exercising a lot and, for the most part, has done well.  She dose [employer] (elliptical, bike, walk, kayak, hike, etc).  She will sometimes get a vague discomfort in her chest.  She describes it as weakness.  She gets fatigued.  It usually occurs when her HR gets to 150-160.  She notices it more with weight training.  She has  a hx of elevated HRs with VT.  It is more controlled since she has been on the Verapamil.  However, she has times where her HR is slow to recover.  She has some dependent leg edema that resolves with elevation.  She snores but has been testing for sleep apnea in the past.  She has no hx of witnessed apnea or daytime hypersomnolence.  She is concerned about weight gain from Eliquis.     Past Medical History:  Diagnosis Date   Anxiety    DVT (deep venous thrombosis) (HCC)    Elevated fasting blood sugar    Gallstone    Gestational diabetes 2000   Hypertension    Left anterior fascicular block    idiopathic 1/12-Ruth Matthews, Ruth Matthews   Obesity    Ovarian cyst    ruptured   RMSF [facility] spotted fever) 2004   history of    Varicose veins    Ventricular tachycardia (HCC)    Vitamin D deficiency    Current Medications: Current Meds  Medication Sig   albuterol (VENTOLIN HFA) 108 (90 Base) MCG/ACT inhaler Inhale 2 puffs into the lungs every 6 (six) hours as needed for wheezing or shortness of breath.   cholecalciferol (VITAMIN D) 400 units/mL SOLN Take by mouth daily at 6 (six) AM.   ELIQUIS 5 MG TABS tablet Take 5 mg by mouth 2 (two) times daily.   EPINEPHrine 0.3  mg/0.3 mL IJ SOAJ injection    fexofenadine (ALLEGRA) 180 MG tablet Take 4 tablets by mouth daily.   fluticasone (FLONASE) 50 MCG/ACT nasal spray Place 1 spray into both nostrils daily.   Multiple Vitamin (MULTIVITAMIN ADULT PO) Take 1 tablet by mouth daily.   omeprazole (PRILOSEC) 20 MG capsule Take 1 capsule (20 mg total) by mouth 2 (two) times daily. For 4 weeks than decrease to daily   Propylene Glycol (SYSTANE BALANCE) 0.6 % SOLN Place 1 drop into both eyes daily as needed (dry eyes).   verapamil (CALAN-SR) 240 MG CR tablet Take 1 tablet (240 mg total) by mouth daily.    Allergies:   Garlic, Latex, Azithromycin, Mixed grasses, Molds & smuts, Onion, Other, and Levaquin [levofloxacin]   Social History   Tobacco Use    Smoking status: Never   Smokeless tobacco: Never  Vaping Use   Vaping Use: Never used  Substance Use Topics   Alcohol use: Yes    Alcohol/week: 0.0 standard drinks of alcohol    Comment: occasionally   Drug use: No    Family Hx: The patient's family history includes COPD in her mother; Hypertension in her mother; Other in her father. There is no history of Colon cancer or Esophageal cancer.  Review of Systems  Gastrointestinal:  Negative for hematochezia and melena.  Genitourinary:  Negative for hematuria.     EKGs/Labs/Other Test Reviewed:    EKG:  EKG is not ordered today.  The ekg ordered today demonstrates n/a  Recent Labs: No results found for requested labs within last 365 days.   Recent Lipid Panel No results for input(s): "CHOL", "TRIG", "HDL", "VLDL", "LDLCALC", "LDLDIRECT" in the last 8760 hours.   Risk Assessment/Calculations/Metrics:           Physical Exam:    VS:  BP 138/82   Pulse 84   Ht 5\' 6"  (1.676 m)   Wt 250 lb 6.4 oz (113.6 kg)   SpO2 96%   BMI 40.42 kg/m     Wt Readings from Last 3 Encounters:  04/22/22 250 lb 6.4 oz (113.6 kg)  01/21/22 250 lb (113.4 kg)  12/23/21 250 lb 6.4 oz (113.6 kg)    Constitutional:      Appearance: Healthy appearance. Not in distress.  Neck:     Vascular: JVD normal.  Pulmonary:     Effort: Pulmonary effort is normal.     Breath sounds: No wheezing. No rales.  Cardiovascular:     Normal rate. Regular rhythm. Normal S1. Normal S2.      Murmurs: There is no murmur.  Edema:    Peripheral edema absent.  Abdominal:     Palpations: Abdomen is soft.  Skin:    General: Skin is warm and dry.  Neurological:     Mental Status: Alert and oriented to person, place and time.          ASSESSMENT & PLAN:   Essential hypertension, benign Controlled.  Continue Verapamil 240 mg once daily.   Paroxysmal ventricular tachycardia Overall well controlled on calcium channel blocker Rx.  Continue Verapamil 240 mg once  daily.   History of DVT (deep vein thrombosis) Her anticoagulation is managed by hematology.  I am not aware of weight gain being a documented adverse reaction to Eliquis.  I have asked her to follow up with her hematologist to see if it would be worth switching to Xarelto.  She is entering menopause.  I suspect hormonal changes are the most likely  cause for weight gain/difficulty losing weight.   Precordial chest pain She has a chest discomfort/sensation when her HRs are higher with exercise. Her symptoms sound non-cardiac.  We discussed proceeding with stress testing to screen for ischemic heart diseaes.  I think an ETT would be reasonable to start with.  She would like to hold off for now.  I advised her to f/u sooner if her symptoms change or worsen.  Otherwise, f/u in 6 mos.            Dispo:  Return in about 6 months (around 10/23/2022) for Routine Follow Up, w/ Dr. Johney Frame.   Medication Adjustments/Labs and Tests Ordered: Current medicines are reviewed at length with the patient today.  Concerns regarding medicines are outlined above.  Tests Ordered: No orders of the defined types were placed in this encounter.  Medication Changes: No orders of the defined types were placed in this encounter.  Signed, Richardson Dopp, PA-C  04/22/2022 12:22 PM    [address], [place], [place]  [address] Phone: [phone]; Fax: [phone]

## 2022-04-22 ENCOUNTER — Ambulatory Visit: Payer: BC Managed Care – PPO | Admitting: Physician Assistant

## 2022-04-22 ENCOUNTER — Encounter: Payer: Self-pay | Admitting: Physician Assistant

## 2022-04-22 VITALS — BP 138/82 | HR 84 | Ht 66.0 in | Wt 250.4 lb

## 2022-04-22 DIAGNOSIS — Z86718 Personal history of other venous thrombosis and embolism: Secondary | ICD-10-CM | POA: Diagnosis not present

## 2022-04-22 DIAGNOSIS — I10 Essential (primary) hypertension: Secondary | ICD-10-CM | POA: Diagnosis not present

## 2022-04-22 DIAGNOSIS — R072 Precordial pain: Secondary | ICD-10-CM | POA: Diagnosis not present

## 2022-04-22 DIAGNOSIS — I4729 Other ventricular tachycardia: Secondary | ICD-10-CM

## 2022-04-22 NOTE — Assessment & Plan Note (Signed)
She has a chest discomfort/sensation when her HRs are higher with exercise. Her symptoms sound non-cardiac.  We discussed proceeding with stress testing to screen for ischemic heart diseaes.  I think an ETT would be reasonable to start with.  She would like to hold off for now.  I advised her to f/u sooner if her symptoms change or worsen.  Otherwise, f/u in 6 mos.

## 2022-04-22 NOTE — Assessment & Plan Note (Signed)
Controlled.  Continue Verapamil 240 mg once daily.

## 2022-04-22 NOTE — Patient Instructions (Signed)
Medication Instructions:  Your physician recommends that you continue on your current medications as directed. Please refer to the Current Medication list given to you today.  *If you need a refill on your cardiac medications before your next appointment, please call your pharmacy*   Lab Work: None ordered  If you have labs (blood work) drawn today and your tests are completely normal, you will receive your results only by: MyChart Message (if you have MyChart) OR A paper copy in the mail If you have any lab test that is abnormal or we need to change your treatment, we will call you to review the results.   Testing/Procedures: None ordered   Follow-Up: At [facility], you and your health needs are our priority.  As part of our continuing mission to provide you with exceptional heart care, we have created designated Provider Care Teams.  These Care Teams include your primary Cardiologist (physician) and Advanced Practice Providers (APPs -  Physician Assistants and Nurse Practitioners) who all work together to provide you with the care you need, when you need it.  We recommend signing up for the patient portal called "MyChart".  Sign up information is provided on this After Visit Summary.  MyChart is used to connect with patients for Virtual Visits (Telemedicine).  Patients are able to view lab/test results, encounter notes, upcoming appointments, etc.  Non-urgent messages can be sent to your provider as well.   To learn more about what you can do with MyChart, go to [email or web address].    Your next appointment:   6 month(s)  The format for your next appointment:   In Person  Provider:   Heather E Pemberton, MD     Other Instructions   Important Information About Sugar       

## 2022-04-22 NOTE — Assessment & Plan Note (Signed)
Her anticoagulation is managed by hematology.  I am not aware of weight gain being a documented adverse reaction to Eliquis.  I have asked her to follow up with her hematologist to see if it would be worth switching to Xarelto.  She is entering menopause.  I suspect hormonal changes are the most likely cause for weight gain/difficulty losing weight.

## 2022-04-22 NOTE — Assessment & Plan Note (Signed)
Overall well controlled on calcium channel blocker Rx.  Continue Verapamil 240 mg once daily.

## 2022-07-23 LAB — COLOGUARD: COLOGUARD: POSITIVE — AB

## 2022-08-02 NOTE — Progress Notes (Unsigned)
Cardiology Office Note:    Date:  08/06/2022   ID:  Alphonse Guild, [place] 12-Sep-1966, MRN [id number]  PCP:  Cathlean Sauer, [place]  Cardiologist:  Freada Bergeron, MD  Advanced Practice Provider:  No care team member to display Electrophysiologist:  None    Referring MD: Cathlean Sauer, MD    History of Present Illness:    Ruth Matthews is a 56 y.o. female with a hx of DVT, HTN, fascicular VT and HLD who was previously followed by Dr. Meda Coffee who now returns to clinic for follow-up.  She was previously seen by Dr Marlou Porch for palpitations and exertional presyncope at a gym in 2012. She was diagnosed with a wide complex tachycardia negatively deflecting QRS complexes in the lateral leads as well as right axis deviation and negative deflection in II, III and aVF. She was given an IV and amiodarone and converted after about 6 minutes. Briefly she was in atrial fibrillation postconversion and then remained in sinus rhythm for the hospitalization. Dr. Caryl Comes recommended verapamil for treatment of this left fascicular VT. Cardiac MRI was unremarkable. Echocardiogram unremarkable. Cardiac catheterization unremarkable, no CAD.  Saw Dr. Meda Coffee on 07/24/2020 where she was feeling more fatigue and SOB with exertion. Also was having recurrent palpitations with exertion. TTE obtained on 01/06/21 showed normal BiV function and no significant valve disease. She was also recommended for coronary CTA but she cancelled as well as an exercise tolerance test which was not completed.  Repeat TTE 11/2021 with LVEF 55-60%, no WMA, normal RV, mild LAE, no significant valve disease. Cardiac monitor 10/2021 with rare SVE, PVCs <1%, no sustained arrhythmias.   Was seen in clinic 01/2022 where she was recovering from a PE that developed after a long car ride from [place] and receiving the COVID vaccine. She was having pain in her legs at that time. Fortunately, LE doppler  negative.  Was last seen by Richardson Dopp on 04/2022 where she was doing well. Decided not to pursue ETT at that time.   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 Marlou Porch, Dr. Caryl Comes   Obesity    Ovarian cyst    ruptured   RMSF [facility] spotted fever) 2004   history of    Varicose veins    Ventricular tachycardia (HCC)    Vitamin D deficiency     Past Surgical History:  Procedure Laterality Date   CARDIAC CATHETERIZATION     no CAD   CESAREAN SECTION     CHOLECYSTECTOMY     ENDOVENOUS ABLATION SAPHENOUS VEIN W/ LASER Bilateral 02/2011   Both legs a few weeks apart   LIVER BIOPSY      Current Medications: Current Meds  Medication Sig   cholecalciferol (VITAMIN D) 400 units/mL SOLN Take by mouth daily at 6 (six) AM.   ELIQUIS 5 MG TABS tablet Take 5 mg by mouth 2 (two) times daily.   EPINEPHrine 0.3 mg/0.3 mL IJ SOAJ injection    fexofenadine (ALLEGRA) 180 MG tablet Take 4 tablets by mouth daily.   fluticasone (FLONASE) 50 MCG/ACT nasal spray Place 1 spray into both nostrils daily.   Multiple Vitamin (MULTIVITAMIN ADULT PO) Take 1 tablet by mouth  daily.   omeprazole (PRILOSEC) 20 MG capsule Take 1 capsule (20 mg total) by mouth 2 (two) times daily. For 4 weeks than decrease to daily   Propylene Glycol (SYSTANE BALANCE) 0.6 % SOLN Place 1 drop into both eyes daily as needed (dry eyes).   verapamil (CALAN-SR) 240 MG CR tablet Take 1 tablet (240 mg total) by mouth daily.     Allergies:   Garlic, Latex, Azithromycin, Mixed grasses, Molds & smuts, Onion, Other, and Levaquin [levofloxacin]   Social History   Socioeconomic History   Marital status: Married    Spouse name: Not on file   Number of children: Not on file   Years of education: Not on file   Highest  education level: Not on file  Occupational History   Not on file  Tobacco Use   Smoking status: Never   Smokeless tobacco: Never  Vaping Use   Vaping Use: Never used  Substance and Sexual Activity   Alcohol use: Yes    Alcohol/week: 0.0 standard drinks of alcohol    Comment: occasionally   Drug use: No   Sexual activity: Yes    Partners: Male    Comment: 1st intercourse-22, partners- 31, married- 1 yrs   Other Topics Concern   Not on file  Social History Narrative   Not on file   Social Determinants of Health   Financial Resource Strain: Not on file  Food Insecurity: Not on file  Transportation Needs: Not on file  Physical Activity: Not on file  Stress: Not on file  Social Connections: Not on file     Family History: The patient's family history includes COPD in her mother; Hypertension in her mother; Other in her father. There is no history of Colon cancer or Esophageal cancer.  ROS:   Please see the history of present illness.    Review of Systems  Constitutional:  Negative for chills, fever and weight loss.  HENT:  Negative for hearing loss.   Eyes:  Negative for blurred vision and redness.  Respiratory:  Negative for hemoptysis and sputum production.   Cardiovascular:  Positive for chest pain (L-sided). Negative for palpitations, orthopnea, claudication, leg swelling and PND.  Gastrointestinal:  Negative for melena, nausea and vomiting.  Genitourinary:  Negative for dysuria and flank pain.  Musculoskeletal:  Positive for myalgias (medial R thigh, posterior L thigh). Negative for falls.  Skin:  Negative for itching and rash.  Neurological:  Negative for dizziness and loss of consciousness.  Endo/Heme/Allergies:  Negative for polydipsia.  Psychiatric/Behavioral:  Negative for substance abuse.     EKGs/Labs/Other Studies Reviewed:    The following studies were reviewed today: TTE 12/24/21: IMPRESSIONS   1. Left ventricular ejection fraction, by estimation, is  55 to 60%. Left  ventricular ejection fraction by 2D MOD biplane is 58.5 %. Left  ventricular ejection fraction by PLAX is 57 %. The left ventricle has  normal function. The left ventricle has no  regional wall motion abnormalities. Left ventricular diastolic parameters  were normal.   2. Right ventricular systolic function is normal. The right ventricular  size is normal. There is normal pulmonary artery systolic pressure.   3. Left atrial size was mildly dilated.   4. The mitral valve is normal in structure. No evidence of mitral valve  regurgitation. No evidence of mitral stenosis.   5. The aortic valve is tricuspid. Aortic valve regurgitation is not  visualized. No aortic stenosis is present.   6. The inferior vena cava  is normal in size with <50% respiratory  variability, suggesting right atrial pressure of 8 mmHg.   Comparison(s): EF 60%.  Cardiac Monitor 10/2021: Patch wear time was 13 days and 15 hours Predominant rhythm was NSR with average HR 76bpm Rare SVE and rare PVCs (<1%) Patient triggered events correlate with sinus rhythm No sustained arrhythmias or significant pauses     Patch Wear Time:  13 days and 15 hours (2022-12-16T15:55:56-0500 to 2022-12-30T07:38:22-0500)   Patient had a min HR of 45 bpm, max HR of 177 bpm, and avg HR of 76 bpm. Predominant underlying rhythm was Sinus Rhythm. Isolated SVEs were rare (<1.0%), SVE Couplets were rare (<1.0%), and SVE Triplets were rare (<1.0%). Isolated VEs were rare (<1.0%,  4358), VE Triplets were rare (<1.0%, 2), and no VE Couplets were present. Ventricular Trigeminy was present.   TTE 01/06/21: IMPRESSIONS   1. Left ventricular ejection fraction, by estimation, is 60 to 65%. Left  ventricular ejection fraction by 3D volume is 60 %. The left ventricle has  normal function. The left ventricle has no regional wall motion  abnormalities. Left ventricular diastolic   parameters were normal.   2. Right ventricular systolic  function is normal. The right ventricular  size is normal. There is normal pulmonary artery systolic pressure.   3. The mitral valve is normal in structure. No evidence of mitral valve  regurgitation. No evidence of mitral stenosis.   4. The aortic valve is normal in structure. Aortic valve regurgitation is  not visualized. No aortic stenosis is present.   5. The inferior vena cava is normal in size with greater than 50%  respiratory variability, suggesting right atrial pressure of 3 mmHg.   Cardiac MR 06/17/19: Findings:  All 4 cardiac chambers were normal in size and function.  There was no ASD, VSD or pericardial effusion.  Mitral, aortic and  tricuspid valves appeared morphologically normal.  Quantitative EF  was 61% ( EDV-119cc, ESV-47cc, SV 72cc) Hyperenhancement images  showed no infiltration or scar tissue in the LV     Impression:        1)    Normal cardiac MRI          2)    EF 61% with no RWMA's  3)    No infiltration or hyperenhancement in LV    Recent Labs: No results found for requested labs within last 365 days.  Recent Lipid Panel    Component Value Date/Time   CHOL 190 07/24/2020 0912   TRIG 132 07/24/2020 0912   HDL 76 07/24/2020 0912   CHOLHDL 2.5 07/24/2020 0912   CHOLHDL 3.8 10/29/2010 1019   VLDL 24 10/29/2010 1019   LDLCALC 91 07/24/2020 0912      Physical Exam:    VS:  BP (!) 140/80 (BP Location: Left Arm, Patient Position: Sitting, Cuff Size: Large)   Pulse 67   Ht 5\' 6"  (1.676 m)   Wt 251 lb (113.9 kg)   SpO2 95%   BMI 40.51 kg/m     Wt Readings from Last 3 Encounters:  08/06/22 251 lb (113.9 kg)  04/22/22 250 lb 6.4 oz (113.6 kg)  01/21/22 250 lb (113.4 kg)     GEN:  Well nourished, well developed in no acute distress HEENT: Normal NECK: No JVD; No carotid bruits CARDIAC: RRR, no murmurs, rubs, gallops RESPIRATORY:  Clear to auscultation without rales, wheezing or rhonchi  ABDOMEN: Soft, non-tender,  non-distended MUSCULOSKELETAL:  No edema; No deformity  SKIN: Warm and  dry NEUROLOGIC:  Alert and oriented x 3 PSYCHIATRIC:  Normal affect   ASSESSMENT:    No diagnosis found.   PLAN:    In order of problems listed above:  #PE: #Bilateral LE Pain: #History of DVT Patient developed an acute PE after long drive from [place] and [place] vaccination in 07/2021. Was managed with apixaban alone.   #Fascicular VT: Diagnosed in 2012. Cardiac MRI normal and cath without obstructive disease. Palpitations overall improved. Has occasional episodes but not sustaining. Exercising daily without issues. No chest pain or SOB. TTE with normal EF, no significant valve disease.  -Continue verapamil 240mg  daily -If recurrence of symptoms, will proceed with exercise stress test  #DOE:  Thought to be due to deconditioning with reassuring cardiac and pulm work-up. TTE with normal BiV function. No anginal or HF symptoms.  -Continue exercise traning  #HLD: LDL 91.  -Continue dietary modifications; lost 45lbs -Will montior  #Obesity: Continues to work on weight loss and has maintained daily exercise and trying to adhere to healthy diet -Continue diet and lifestyle modifications -Will think about GLP-1 agonist  Exercise recommendations: Goal of exercising for at least 30 minutes a day, at least 5 times per week.  Please exercise to a moderate exertion.  This means that while exercising it is difficult to speak in full sentences, however you are not so short of breath that you feel you must stop, and not so comfortable that you can carry on a full conversation.  Exertion level should be approximately a 5/10, if 10 is the most exertion you can perform.  Diet recommendations: Recommend a heart healthy diet such as the Mediterranean diet.  This diet consists of plant based foods, healthy fats, lean meats, olive oil.  It suggests limiting the intake of simple carbohydrates such as white breads,  pastries, and pastas.  It also limits the amount of red meat, wine, and dairy products such as cheese that one should consume on a daily basis.    Medication Adjustments/Labs and Tests Ordered: Current medicines are reviewed at length with the patient today.  Concerns regarding medicines are outlined above.  No orders of the defined types were placed in this encounter.  No orders of the defined types were placed in this encounter.   There are no Patient Instructions on file for this visit.    Signed, Freada Bergeron, MD  08/06/2022 4:54 PM    [place]

## 2022-08-06 ENCOUNTER — Encounter: Payer: Self-pay | Admitting: Cardiology

## 2022-08-06 ENCOUNTER — Ambulatory Visit: Payer: BC Managed Care – PPO | Attending: Cardiology | Admitting: Cardiology

## 2022-08-06 VITALS — BP 138/89 | HR 67 | Ht 66.0 in | Wt 251.0 lb

## 2022-08-06 DIAGNOSIS — I10 Essential (primary) hypertension: Secondary | ICD-10-CM | POA: Diagnosis not present

## 2022-08-06 DIAGNOSIS — I472 Ventricular tachycardia, unspecified: Secondary | ICD-10-CM

## 2022-08-06 DIAGNOSIS — I4729 Other ventricular tachycardia: Secondary | ICD-10-CM

## 2022-08-06 DIAGNOSIS — E785 Hyperlipidemia, unspecified: Secondary | ICD-10-CM

## 2022-08-06 DIAGNOSIS — R0609 Other forms of dyspnea: Secondary | ICD-10-CM

## 2022-08-06 DIAGNOSIS — Z86711 Personal history of pulmonary embolism: Secondary | ICD-10-CM | POA: Diagnosis not present

## 2022-08-06 NOTE — Progress Notes (Signed)
Cardiology Office Note:    Date:  08/06/2022   ID:  Alphonse Guild, [place] [birth year], MRN [id number]  PCP:  Cathlean Sauer, [place]  Cardiologist:  Freada Bergeron, MD  Advanced Practice Provider:  No care team member to display Electrophysiologist:  None    Referring MD: Cathlean Sauer, MD    History of Present Illness:    Ruth Matthews is a 55 y.o. female with a hx of DVT, HTN, fascicular VT and HLD who was previously followed by Dr. Meda Coffee who now returns to clinic for follow-up.  She was previously seen by Dr Marlou Porch for palpitations and exertional presyncope at a gym in 2012. She was diagnosed with a wide complex tachycardia negatively deflecting QRS complexes in the lateral leads as well as right axis deviation and negative deflection in II, III and aVF. She was given an IV and amiodarone and converted after about 6 minutes. Briefly she was in atrial fibrillation postconversion and then remained in sinus rhythm for the hospitalization. Dr. Caryl Comes recommended verapamil for treatment of this left fascicular VT. Cardiac MRI was unremarkable. Echocardiogram unremarkable. Cardiac catheterization unremarkable, no CAD.  Saw Dr. Meda Coffee on 07/24/2020 where she was feeling more fatigue and SOB with exertion. Also was having recurrent palpitations with exertion. TTE obtained on 01/06/21 showed normal BiV function and no significant valve disease. She was also recommended for coronary CTA but she cancelled as well as an exercise tolerance test which was not completed.  Repeat TTE 11/2021 with LVEF 55-60%, no WMA, normal RV, mild LAE, no significant valve disease. Cardiac monitor 10/2021 with rare SVE, PVCs <1%, no sustained arrhythmias.   Was seen in clinic 01/2022 where she was recovering from a PE that developed after a long car ride from [place] and receiving the COVID vaccine. She was having pain in her legs at that time. Fortunately, LE doppler  negative.  Was last seen by Richardson Dopp on 04/2022 where she was doing well. Decided not to pursue ETT at that time.   Today, the patient states that this past Sunday she noticed her heart beating in a strange rhythm in the setting of dancing. This was notably different from her episode of tachycardia in 2012. She stopped and drank some water, resting until her symptoms resolved. She denies any recurring episodes of arrhythmia since then.  Lately she continues to feel generally fatigued and weak. After finishing her work day she usually needs to take a nap at home for a few hours. Her issues with fatigue have been ongoing since prior to her COVID-19 infection near the end of January 2023.   Typically, she exercises almost every day and feels good without anginal symptoms. In the last couple weeks she has not been exercising as frequently, which she states is new for her. She attributes this to her fatigue and daytime somnolence. At night she is not sleeping well. She complains of nocturnal hot flashes. She declined ETT, CTA or Ca score today.  At home she has fallen out of the habit of monitoring her blood pressure. She would still endorse more controlled readings at home than in the office today (140/80, and 138/89 on recheck). She has been compliant with Eliquis for a year.  Her TSH was 0.89 as of 06/19/22. Unfortunately she is unable to see an endocrinologist until 11/2022.  She denies any chest pain, shortness of breath, or peripheral edema. No lightheadedness, headaches, syncope, orthopnea, or PND.  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 Marlou Porch, Dr. Caryl Comes   Obesity    Ovarian cyst    ruptured   [facility] spotted fever) 2004   history of    Varicose veins    Ventricular  tachycardia (HCC)    Vitamin D deficiency     Past Surgical History:  Procedure Laterality Date   CARDIAC CATHETERIZATION     no CAD   CESAREAN SECTION     CHOLECYSTECTOMY     ENDOVENOUS ABLATION SAPHENOUS VEIN W/ LASER Bilateral 02/2011   Both legs a few weeks apart   LIVER BIOPSY      Current Medications: Current Meds  Medication Sig   cholecalciferol (VITAMIN D) 400 units/mL SOLN Take by mouth daily at 6 (six) AM.   ELIQUIS 5 MG TABS tablet Take 5 mg by mouth 2 (two) times daily.   EPINEPHrine 0.3 mg/0.3 mL IJ SOAJ injection    fexofenadine (ALLEGRA) 180 MG tablet Take 4 tablets by mouth daily.   fluticasone (FLONASE) 50 MCG/ACT nasal spray Place 1 spray into both nostrils daily.   Multiple Vitamin (MULTIVITAMIN ADULT PO) Take 1 tablet by mouth daily.   omeprazole (PRILOSEC) 20 MG capsule Take 1 capsule (20 mg total) by mouth 2 (two) times daily. For 4 weeks than decrease to daily   Propylene Glycol (SYSTANE BALANCE) 0.6 % SOLN Place 1 drop into both eyes daily as needed (dry eyes).   verapamil (CALAN-SR) 240 MG CR tablet Take 1 tablet (240 mg total) by mouth daily.     Allergies:   Garlic, Latex, Azithromycin, Mixed grasses, Molds & smuts, Onion, Other, and Levaquin [levofloxacin]   Social History   Socioeconomic History   Marital status: Married    Spouse name: Not on file   Number of children: Not on file   Years of education: Not on file   Highest education level: Not on file  Occupational History   Not on file  Tobacco Use   Smoking status: Never   Smokeless tobacco: Never  Vaping Use   Vaping Use: Never used  Substance and Sexual Activity   Alcohol use: Yes    Alcohol/week: 0.0 standard drinks of alcohol    Comment: occasionally   Drug use: No   Sexual activity: Yes    Partners: Male    Comment: 1st intercourse-22, partners- 97, married- 57 yrs   Other Topics Concern   Not on file  Social History Narrative   Not on file   Social Determinants of  Health   Financial Resource Strain: Not on file  Food Insecurity: Not on file  Transportation Needs: Not on file  Physical Activity: Not on file  Stress: Not on file  Social Connections: Not on file     Family History: The patient's family history includes COPD in her mother; Hypertension in her mother; Other in her father. There is no history of Colon cancer or Esophageal cancer.  ROS:   Please see the history of present illness.    Review of Systems  Constitutional:  Positive for malaise/fatigue. Negative for chills, fever and weight loss.  HENT:  Negative for hearing loss.   Eyes:  Negative for blurred vision and redness.  Respiratory:  Negative for hemoptysis  and sputum production.   Cardiovascular:  Positive for palpitations. Negative for chest pain, orthopnea, claudication, leg swelling and PND.  Gastrointestinal:  Negative for melena, nausea and vomiting.  Genitourinary:  Negative for dysuria and flank pain.  Musculoskeletal:  Negative for falls.  Skin:  Negative for itching and rash.  Neurological:  Positive for weakness. Negative for dizziness and loss of consciousness.  Endo/Heme/Allergies:  Negative for polydipsia.  Psychiatric/Behavioral:  Negative for substance abuse.     EKGs/Labs/Other Studies Reviewed:    The following studies were reviewed today:  TTE 11/2021: IMPRESSIONS   1. Left ventricular ejection fraction, by estimation, is 55 to 60%. Left  ventricular ejection fraction by 2D MOD biplane is 58.5 %. Left  ventricular ejection fraction by PLAX is 57 %. The left ventricle has  normal function. The left ventricle has no  regional wall motion abnormalities. Left ventricular diastolic parameters  were normal.   2. Right ventricular systolic function is normal. The right ventricular  size is normal. There is normal pulmonary artery systolic pressure.   3. Left atrial size was mildly dilated.   4. The mitral valve is normal in structure. No evidence of  mitral valve  regurgitation. No evidence of mitral stenosis.   5. The aortic valve is tricuspid. Aortic valve regurgitation is not  visualized. No aortic stenosis is present.   6. The inferior vena cava is normal in size with <50% respiratory  variability, suggesting right atrial pressure of 8 mmHg.   Comparison(s): EF 60%.  Cardiac Monitor 10/2021: Patch wear time was 13 days and 15 hours Predominant rhythm was NSR with average HR 76bpm Rare SVE and rare PVCs (<1%) Patient triggered events correlate with sinus rhythm No sustained arrhythmias or significant pauses     Patch Wear Time:  13 days and 15 hours (2022-12-16T15:55:56-0500 to 2022-12-30T07:38:22-0500)   Patient had a min HR of 45 bpm, max HR of 177 bpm, and avg HR of 76 bpm. Predominant underlying rhythm was Sinus Rhythm. Isolated SVEs were rare (<1.0%), SVE Couplets were rare (<1.0%), and SVE Triplets were rare (<1.0%). Isolated VEs were rare (<1.0%,  4358), VE Triplets were rare (<1.0%, 2), and no VE Couplets were present. Ventricular Trigeminy was present.   TTE 01/06/21: IMPRESSIONS   1. Left ventricular ejection fraction, by estimation, is 60 to 65%. Left  ventricular ejection fraction by 3D volume is 60 %. The left ventricle has  normal function. The left ventricle has no regional wall motion  abnormalities. Left ventricular diastolic   parameters were normal.   2. Right ventricular systolic function is normal. The right ventricular  size is normal. There is normal pulmonary artery systolic pressure.   3. The mitral valve is normal in structure. No evidence of mitral valve  regurgitation. No evidence of mitral stenosis.   4. The aortic valve is normal in structure. Aortic valve regurgitation is  not visualized. No aortic stenosis is present.   5. The inferior vena cava is normal in size with greater than 50%  respiratory variability, suggesting right atrial pressure of 3 mmHg.   Cardiac MRI 06/17/19: Findings:  All  4 cardiac chambers were normal in size and function.  There was no ASD, VSD or pericardial effusion.  Mitral, aortic and  tricuspid valves appeared morphologically normal.  Quantitative EF  was 61% ( EDV-119cc, ESV-47cc, SV 72cc) Hyperenhancement images  showed no infiltration or scar tissue in the LV     Impression:  1)    Normal cardiac  MRI  2)    EF 61% with no RWMA's  3)    No infiltration or hyperenhancement in LV   EKG:  EKG is personally reviewed. 08/06/2022:  Sinus rhythm. Rate 54 bpm.   Recent Labs: No results found for requested labs within last 365 days.   Recent Lipid Panel    Component Value Date/Time   CHOL 190 07/24/2020 0912   TRIG 132 07/24/2020 0912   HDL 76 07/24/2020 0912   CHOLHDL 2.5 07/24/2020 0912   CHOLHDL 3.8 10/29/2010 1019   VLDL 24 10/29/2010 1019   LDLCALC 91 07/24/2020 0912      Physical Exam:    VS:  BP 138/89 (BP Location: Right Arm, Patient Position: Sitting, Cuff Size: Large)   Pulse 67   Ht 5\' 6"  (1.676 m)   Wt 251 lb (113.9 kg)   SpO2 95%   BMI 40.51 kg/m     Wt Readings from Last 3 Encounters:  08/06/22 251 lb (113.9 kg)  04/22/22 250 lb 6.4 oz (113.6 kg)  01/21/22 250 lb (113.4 kg)     GEN:  Well nourished, well developed in no acute distress HEENT: Normal NECK: No JVD; No carotid bruits CARDIAC: RRR, no murmurs, rubs, gallops RESPIRATORY:  Clear to auscultation without rales, wheezing or rhonchi  ABDOMEN: Soft, non-tender, non-distended MUSCULOSKELETAL:  No edema; No deformity  SKIN: Warm and dry NEUROLOGIC:  Alert and oriented x 3 PSYCHIATRIC:  Normal affect   ASSESSMENT:    1. History of pulmonary embolism   2. Essential hypertension, benign   3. Paroxysmal ventricular tachycardia (HCC)   4. Paroxysmal VT ([place])   5. Hyperlipidemia, unspecified hyperlipidemia type   6. DOE (dyspnea on exertion)      PLAN:    In order of problems listed above:  #PE: #Bilateral LE Pain: #History of DVT Patient  developed an acute PE after long drive from [place] and [place] vaccination in 07/2021. Was managed with apixaban alone. Has follow-up with heme next month. -Management per Heme  #Fascicular VT: Diagnosed in 2012. Cardiac MRI normal and cath without obstructive disease. Palpitations overall improved. Has occasional episodes but not sustaining. Exercising regularly without issues. TTE with normal EF, no significant valve disease. She will continue to monitor with her smart watch and if has recurrence, will repeat monitor at that time. -Continue monitoring with smart watch -Continue verapamil 240mg  daily -If recurrence of symptoms, will proceed with repeat monitor  #DOE:  Thought to be due to deconditioning with reassuring cardiac and pulm work-up. Declined ETT or Ca scoring. TTE with normal BiV function. No anginal or HF symptoms.  -Continue exercise traning  #HLD: LDL 91.  -Continue dietary modifications; lost 45lbs -Will montior  #Obesity: Continues to work on weight loss and has maintained daily exercise and trying to adhere to healthy diet -Continue diet and lifestyle modifications -Will think about GLP-1 agonist  Exercise recommendations: Goal of exercising for at least 30 minutes a day, at least 5 times per week.  Please exercise to a moderate exertion.  This means that while exercising it is difficult to speak in full sentences, however you are not so short of breath that you feel you must stop, and not so comfortable that you can carry on a full conversation.  Exertion level should be approximately a 5/10, if 10 is the most exertion you can perform.  Diet recommendations: Recommend a heart healthy diet such as the Mediterranean diet.  This diet consists of plant based foods,  healthy fats, lean meats, olive oil.  It suggests limiting the intake of simple carbohydrates such as white breads, pastries, and pastas.  It also limits the amount of red meat, wine, and dairy products such  as cheese that one should consume on a daily basis.  Follow-up:  6 months.   Medication Adjustments/Labs and Tests Ordered: Current medicines are reviewed at length with the patient today.  Concerns regarding medicines are outlined above.   Orders Placed This Encounter  Procedures   EKG 12-Lead   No orders of the defined types were placed in this encounter.  Patient Instructions  Medication Instructions:   Your physician recommends that you continue on your current medications as directed. Please refer to the Current Medication list given to you today.  *If you need a refill on your cardiac medications before your next appointment, please call your pharmacy*     Follow-Up: At [facility], you and your health needs are our priority.  As part of our continuing mission to provide you with exceptional heart care, we have created designated Provider Care Teams.  These Care Teams include your primary Cardiologist (physician) and Advanced Practice Providers (APPs -  Physician Assistants and Nurse Practitioners) who all work together to provide you with the care you need, when you need it.  We recommend signing up for the patient portal called "MyChart".  Sign up information is provided on this After Visit Summary.  MyChart is used to connect with patients for Virtual Visits (Telemedicine).  Patients are able to view lab/test results, encounter notes, upcoming appointments, etc.  Non-urgent messages can be sent to your provider as well.   To learn more about what you can do with MyChart, go to [email or web address].    Your next appointment:   6 month(s)  The format for your next appointment:   In Person  Provider:   Freada Bergeron, MD      Important Information About Sugar        I,Mathew Stumpf,acting as a scribe for Freada Bergeron, MD.,have documented all relevant documentation on the behalf of Freada Bergeron, MD,as directed by  Freada Bergeron, MD while in the presence of Freada Bergeron, MD.  I, Freada Bergeron, MD, have reviewed all documentation for this visit. The documentation on 08/06/22 for the exam, diagnosis, procedures, and orders are all accurate and complete.   Signed, Freada Bergeron, MD  08/06/2022 5:15 PM    [place]

## 2022-08-06 NOTE — Patient Instructions (Signed)
Medication Instructions:   Your physician recommends that you continue on your current medications as directed. Please refer to the Current Medication list given to you today.  *If you need a refill on your cardiac medications before your next appointment, please call your pharmacy*   Follow-Up: At [facility], you and your health needs are our priority.  As part of our continuing mission to provide you with exceptional heart care, we have created designated Provider Care Teams.  These Care Teams include your primary Cardiologist (physician) and Advanced Practice Providers (APPs -  Physician Assistants and Nurse Practitioners) who all work together to provide you with the care you need, when you need it.  We recommend signing up for the patient portal called "MyChart".  Sign up information is provided on this After Visit Summary.  MyChart is used to connect with patients for Virtual Visits (Telemedicine).  Patients are able to view lab/test results, encounter notes, upcoming appointments, etc.  Non-urgent messages can be sent to your provider as well.   To learn more about what you can do with MyChart, go to [email or web address].    Your next appointment:   6 month(s)  The format for your next appointment:   In Person  Provider:   Heather E Pemberton, MD     Important Information About Sugar       

## 2022-08-10 ENCOUNTER — Other Ambulatory Visit: Payer: Self-pay

## 2022-08-10 MED ORDER — VERAPAMIL HCL ER 240 MG PO TBCR: 240.0000 mg | EXTENDED_RELEASE_TABLET | Freq: Every day | ORAL | 3 refills | Status: DC

## 2022-10-19 ENCOUNTER — Other Ambulatory Visit: Payer: Self-pay | Admitting: Gastroenterology

## 2023-02-24 ENCOUNTER — Ambulatory Visit: Payer: BC Managed Care – PPO | Admitting: Primary Care

## 2023-03-01 ENCOUNTER — Encounter: Payer: Self-pay | Admitting: Nurse Practitioner

## 2023-03-01 ENCOUNTER — Ambulatory Visit (INDEPENDENT_AMBULATORY_CARE_PROVIDER_SITE_OTHER): Payer: BC Managed Care – PPO | Admitting: Nurse Practitioner

## 2023-03-01 ENCOUNTER — Ambulatory Visit (INDEPENDENT_AMBULATORY_CARE_PROVIDER_SITE_OTHER): Payer: BC Managed Care – PPO

## 2023-03-01 VITALS — BP 130/78 | HR 76 | Temp 98.1°F | Ht 66.0 in | Wt 248.8 lb

## 2023-03-01 DIAGNOSIS — Z0000 Encounter for general adult medical examination without abnormal findings: Secondary | ICD-10-CM

## 2023-03-01 DIAGNOSIS — J411 Mucopurulent chronic bronchitis: Secondary | ICD-10-CM

## 2023-03-01 DIAGNOSIS — J329 Chronic sinusitis, unspecified: Secondary | ICD-10-CM

## 2023-03-01 MED ORDER — BUDESONIDE-FORMOTEROL FUMARATE 80-4.5 MCG/ACT IN AERO
2.0000 | INHALATION_SPRAY | Freq: Two times a day (BID) | RESPIRATORY_TRACT | 5 refills | Status: DC
Start: 2023-03-01 — End: 2023-09-24

## 2023-03-01 MED ORDER — ALBUTEROL SULFATE HFA 108 (90 BASE) MCG/ACT IN AERS
2.0000 | INHALATION_SPRAY | Freq: Four times a day (QID) | RESPIRATORY_TRACT | 2 refills | Status: DC | PRN
Start: 2023-03-01 — End: 2023-09-24

## 2023-03-01 MED ORDER — AZELASTINE-FLUTICASONE 137-50 MCG/ACT NA SUSP
1.0000 | Freq: Two times a day (BID) | NASAL | 5 refills | Status: DC
Start: 2023-03-01 — End: 2023-09-24

## 2023-03-01 NOTE — Progress Notes (Signed)
@Patient  ID: Ruth Matthews, female    DOB: 07/30/1966, 57 y.o.   MRN: [id number]  Chief Complaint  Patient presents with   Follow-up    Pt has been having respiratory issues. Experiencing fatigue. Pt has been on multiple anabiotics and steroids for bronchitis      Referring provider: Herma Carson, MD  HPI: 57 year old female, never smoker followed for dyspnea and symptoms related to PE. She developed multiple subsegmental pulmonary emboli without acute cor pulmonale in October 2022 after long drive and COVID vaccination which she is currently on lifelong anticoagulation for. She is a patient of Dr. George Hugh and last seen in office on 12/23/2021. Past medical history significant for previous post-op DVT 2012, fibromyalgia, anxiety, HTN, obesity, hx of RMSF.   TEST/EVENTS:  01/06/2021 echocardiogram: normal LV systolic and diastolic function. No significant valvular abnormalities  08/05/2021 CTA: multiple bilateral pulmonary emboli in the upper and lower lobes  10/17/2021 PFT: FVC 103, FEV1 107, ratio 82, TLC 101, DLCOcor 101. No BD 12/01/2021 echo: EF 55-60%, normal diastolic parameters, RV size and function nl. Nl PASP. LA mildly dilated.  12/17/2021 CT chest wo con: scattered small mediastinal nodes, not enlarged by criteria. No bulky hilar nodes. Small hiatal hernia. No acute findings in the chest. No evidence of ILD.   09/22/2021: OV with Dr. Everardo All. New consult for dyspnea related to multiple subsegmental PE. Reported experiencing dizziness, shortness of breath and occasional wheezing. 80% return to baseline day to day and 60% return to baseline with exercise. 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 PFTs ordered. Advised trailing albuterol 15 min prior to exercise.   10/03/2021: OV with cardiology. Previously evaluated in 2012 for palpitations and exertional presyncope per note - EKG changes warranted IV amio with  successful conversion. Reported dyspnea in March 2022 - echo showed normal function without valvular disease. Reviewed echo from [facility] hospital at this visit - LVEF 60-65%, G1DD, trace MR, mild TR; not concerning and discussed with patient. Referred to PREP exercise program at the [facility] for weight loss. Ordered 14 day heart monitor for palpitations/dizziness. Suspect DOE likely multifactorial but mainly related to deconditioning.   10/23/2020: OV with Leonard Hendler NP after PFTs, which were completed on 12/30. Her pulmonary function testing was unremarkable with normal lung function, volumes and diffusing capacity. She continues to experience some dyspnea upon exertion, but reports an slow improvement in this as time goes on. She states that some days are better than other. She does continue to have an intermittent wheeze, which she has not used her albuterol inhaler for but states she didn't notice much of a difference when it was administered during her PFTs. She continues to have some dizziness, which cardiology ordered a 2 week heart monitor for and she just completed last week - awaiting results. She has not had any syncopal events. She does have chronic rhinitis and seasonal allergies, which she has seen an allergist for in the past and is treated with Allegra and flonase. She denies cough, PND, chest pain, or lower extremity edema. She continues on Eliquis Twice daily and denies an excessive bleeding/brusing or hemoptysis. She is unsure when she is supposed to follow up with hematology next. She does have some ongoing neck pain, which she is working with PT for. She continues to exercise daily. Overall, she feels as though she is slowly improving but is not back to her baseline.   11/24/2021: Ov  with Dr. Everardo All. COVID in January 2023. Last week had chest tightness and back pain that has improved. Feels like she has some pain in left lung with deep breathing. Has some wheezing and cough. Continues to have chills. No  fevers. Attempting to exercise daily. CXR with right pulmonary opacity. Tx with augmentin. Discussed additional bronchodilator; however, she wanted to hold off. Consider low dose ICS/LABA at next visit if albuterol not sufficient.   12/23/2021: OV with Dr. Everardo All. CT chest and echo after last visit. No significant findings. Workup for dyspnea unrevealing and likely due to deconditioning since her PE and subsequent COVID. Encouraged regular exercise/frequent activity.   03/03/2023: Today - acute Patient presents today for acute visit. She was doing better after she was last seen. Exercising on a more regular basis, which seemed to improve her dyspnea. Felt like she was getting back to her normal. Still had some occasional leg swelling but not too bothersome. Then in December, she was sick with URI. COVID at the time was negative. No other viral testing. She had antibiotic at the time, which helped but shortly after, symptoms returned. Ever since, she has been having trouble with recurrent sinus infections and bronchitis. Her chest will become congested and tight, she will have a productive cough, and occasionally notices some wheezing with these episodes. She will also feel more short winded. She tells me she's had probably 7-8 rounds of abx since December 2023. She has been seen at urgent care a few times and through her insurance with [employer] [facility]. She also saw ENT back in February, who recommended CT of the sinuses but she deferred for the time being. She has not gone back to see them. Her last occurrence was around two weeks ago with sinus symptoms, congested/productive cough, chest tightness/congestion, and wheezing. She did not have any fevers or chills. She was treated with doxycycline and prednisone, which she completed about a week ago. She feels better now and pretty much back to her normal, which is what tends to happen. Does still have some occasional nasal congestion/clear drainage but cough and other  symptoms have resolved. She does not feel short winded when she's feeling better and doesn't notice any wheezing. She denies any hemoptysis, increased leg swelling, weight loss, anorexia, fevers, chills, orthopnea, voice hoarseness. She does use her albuterol when she is sick, which helps some. She has not had any imaging or labs. Does not have a PCP right now. Would like a referral.   FeNO 13 ppb  Allergies  Allergen Reactions   Garlic Swelling    Tongue swelling and sensation of throat swelling Tongue swelling and sensation of throat swelling    Latex Itching, Swelling and Rash   Azithromycin     Upset stomach    Mixed Grasses    Molds & Smuts    Onion Swelling    Tongue swelling and sensation of throat swelling Tongue swelling and sensation of throat swelling    Other     "whitening toothpaste"  "earring metals," "nickels and metals"    Levaquin [Levofloxacin]     Immunization History  Administered Date(s) Administered   Influenza Inj Mdck Quad Pf 07/27/2019   Influenza, High Dose Seasonal PF 01/03/2020   Influenza,inj,Quad PF,6+ Mos 08/12/2018   PFIZER Comirnaty(Gray Top)Covid-19 Tri-Sucrose Vaccine 12/18/2019   PFIZER(Purple Top)SARS-COV-2 Vaccination 12/18/2019, 01/03/2020   Pfizer Covid-19 Vaccine Bivalent Booster 101yrs & up 07/29/2021   Tdap 07/21/2021    Past Medical History:  Diagnosis Date  Anxiety    DVT (deep venous thrombosis) (HCC)    Elevated fasting blood sugar    Gallstone    Gestational diabetes 2000   Hypertension    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 Anne Fu, Dr. Graciela Husbands   Obesity    Ovarian cyst    ruptured   RMSF [facility] spotted fever) 2004   history of    Varicose veins    Ventricular tachycardia (HCC)    Vitamin D deficiency     Tobacco History: Social History   Tobacco Use  Smoking Status Never  Smokeless Tobacco Never   Counseling given: Not Answered   Outpatient Medications Prior to Visit   Medication Sig Dispense Refill   cholecalciferol (VITAMIN D) 400 units/mL SOLN Take by mouth daily at 6 (six) AM.     ELIQUIS 5 MG TABS tablet Take 5 mg by mouth 2 (two) times daily.     EPINEPHrine 0.3 mg/0.3 mL IJ SOAJ injection      fexofenadine (ALLEGRA) 180 MG tablet Take 4 tablets by mouth daily.     Multiple Vitamin (MULTIVITAMIN ADULT PO) Take 1 tablet by mouth daily.     omeprazole (PRILOSEC) 20 MG capsule Take 1 capsule (20 mg total) by mouth 2 (two) times daily. For 4 weeks than decrease to daily 180 capsule 2   Propylene Glycol (SYSTANE BALANCE) 0.6 % SOLN Place 1 drop into both eyes daily as needed (dry eyes).     verapamil (CALAN-SR) 240 MG CR tablet Take 1 tablet (240 mg total) by mouth daily. 90 tablet 3   fluticasone (FLONASE) 50 MCG/ACT nasal spray Place 1 spray into both nostrils daily.     No facility-administered medications prior to visit.     Review of Systems:   Constitutional: No weight loss or gain, night sweats, fevers, chills, fatigue HEENT: No headaches, difficulty swallowing, tooth/dental problems, or sore throat. No sneezing, itching, ear ache. +occasional nasal congestion; recurrent sinusitis  CV:  No chest pain, orthopnea, dizziness, PND, swelling in lower extremities, anasarca, palpitations, syncope Resp: +shortness of breath with bronchitis; wheezing (resolved); recurrent bronchitis. No current cough. No hemoptysis. No chest wall deformity GI:  No heartburn, indigestion, abdominal pain, nausea, vomiting, diarrhea, change in bowel habits, loss of appetite, bloody stools.  GU: No dysuria, change in color of urine, urgency or frequency.  No flank pain, no hematuria  Skin: No rash, lesions, ulcerations MSK:  No joint swelling.  No decreased range of motion.  +chronic back/neck pain Neuro: No dizziness or lightheadedness.  Psych: No depression or anxiety. Mood stable.     Physical Exam:  BP 130/78   Pulse 76   Temp 98.1 F (36.7 C) (Oral)   Ht 5\' 6"   (1.676 m)   Wt 248 lb 12.8 oz (112.9 kg)   SpO2 99%   BMI 40.16 kg/m   GEN: Pleasant, interactive, well-appearing; obese; in no acute distress. HEENT:  Normocephalic and atraumatic. EACs patent bilaterally. PERRLA. Sclera white. Nasal turbinates erythematous, moist and patent bilaterally. No rhinorrhea present. Oropharynx pink and moist, without exudate or edema. No lesions, ulcerations, or postnasal drip.  NECK:  Supple w/ fair ROM. No JVD present. Normal carotid impulses w/o bruits. Thyroid symmetrical with no goiter or nodules palpated. No lymphadenopathy.   CV: RRR, no m/r/g, no peripheral edema. Pulses intact, +2 bilaterally. No cyanosis, pallor or clubbing. PULMONARY:  Unlabored, regular breathing. Clear bilaterally A&P w/o wheezes/rales/rhonchi. No accessory muscle use. No dullness to percussion. GI: BS  present and normoactive. Soft, non-tender to palpation. No organomegaly or masses detected.  MSK: No erythema, warmth or tenderness. Cap refil <2 sec all extrem. No deformities or joint swelling noted.  Neuro: A/Ox3. No focal deficits noted.   Skin: Warm, no lesions or rashe Psych: Normal affect and behavior. Judgement and thought content appropriate.     Lab Results:  CBC    Component Value Date/Time   WBC 5.2 07/24/2020 0912   WBC 6.9 09/20/2019 1230   RBC 5.03 07/24/2020 0912   RBC 5.28 (H) 09/20/2019 1230   HGB 13.7 07/24/2020 0912   HCT 41.3 07/24/2020 0912   PLT 218 07/24/2020 0912   MCV 82 07/24/2020 0912   MCH 27.2 07/24/2020 0912   MCH 26.5 09/20/2019 1230   MCHC 33.2 07/24/2020 0912   MCHC 31.7 09/20/2019 1230   RDW 14.6 07/24/2020 0912   LYMPHSABS 1.3 09/04/2019 0815   MONOABS 0.5 09/04/2019 0815   EOSABS 0.2 09/04/2019 0815   BASOSABS 0.0 09/04/2019 0815    BMET    Component Value Date/Time   NA 140 07/24/2020 0912   K 4.6 07/24/2020 0912   CL 103 07/24/2020 0912   CO2 23 07/24/2020 0912   GLUCOSE 104 (H) 07/24/2020 0912   GLUCOSE 111 (H)  09/04/2019 0815   BUN 11 07/24/2020 0912   CREATININE 0.75 07/24/2020 0912   CALCIUM 9.7 07/24/2020 0912   GFRNONAA 92 07/24/2020 0912   GFRAA 106 07/24/2020 0912    BNP No results found for: "BNP"   Imaging:  No results found.       Latest Ref Rng & Units 10/17/2021   10:42 AM  PFT Results  FVC-Pre L 3.85   FVC-Predicted Pre % 103   FVC-Post L 3.77   FVC-Predicted Post % 101   Pre FEV1/FVC % % 81   Post FEV1/FCV % % 82   FEV1-Pre L 3.14   FEV1-Predicted Pre % 107   FEV1-Post L 3.08   DLCO uncorrected ml/min/mmHg 22.64   DLCO UNC% % 101   DLCO corrected ml/min/mmHg 22.64   DLCO COR %Predicted % 101   DLVA Predicted % 103   TLC L 5.44   TLC % Predicted % 101   RV % Predicted % 85     No results found for: "NITRICOXIDE"      Assessment & Plan:   Bronchitis, mucopurulent recurrent (HCC) Episodes of recurrent bronchitis following URI in December 2023. She is responsive to steroids/abx. Possible she has developed a reactive airway/asthmatic response or her respiratory symptoms could be coming from recurrent sinusitis. She is clinically improved today after completing empiric doxy and prednisone a week ago. FeNO is nl but this could be due to recent steroids/resolution of symptoms. I think it would be appropriate to start her on ICS/LABA therapy for maintenance at this point. Medication education provided. Teachback performed. We should also obtain CXR as she has not completed any images and repeat PFTs. Could consider CT chest imaging dependent on results/response to above plan to rule out development of ILD or chronic atypical infection. Would also recommend obtaining sputum cultures, should her symptoms recur. Hold off on labs today as she just completed prednisone; will likely obtain IgE and eosinophil for baseline when she returns. Action plan in place.  Patient Instructions  Continue allegra daily Continue saline rinses 1-2 times a day for nasal congestion   Continue Eliquis 5 mg Twice daily  Continue omeprazole 1 tab Twice daily   Albuterol inhaler 2  puffs every 6 hours as needed for shortness of breath or wheezing.  Start Symbicort 2 puffs Twice daily. Brush tongue and rinse mouth afterwards. This is your new maintenance inhaler that you will use daily.  Fluticasone/azelastine nasal spray 1 spray each nostril Twice daily for nasal congestion/drainage   Call ENT for follow up since you have had more infections since you saw them last  Referral to a new primary care provider  Chest x ray today   Follow up in 6 weeks after PFTs with Dr. Everardo All (1st) or Katie June Rode,NP. If symptoms do not improve or worsen, please contact office for sooner follow up or seek emergency care.    Recurrent sinusitis See above. Symptoms resolved today. I am going to step her up to antihistamine/intranasal steroid combo. Encouraged her to see ENT for further workup/management.     Noemi Chapel, NP 03/03/2023  Pt aware and understands NP's role.

## 2023-03-01 NOTE — Patient Instructions (Addendum)
Continue allegra daily Continue saline rinses 1-2 times a day for nasal congestion  Continue Eliquis 5 mg Twice daily  Continue omeprazole 1 tab Twice daily   Albuterol inhaler 2 puffs every 6 hours as needed for shortness of breath or wheezing.  Start Symbicort 2 puffs Twice daily. Brush tongue and rinse mouth afterwards. This is your new maintenance inhaler that you will use daily.  Fluticasone/azelastine nasal spray 1 spray each nostril Twice daily for nasal congestion/drainage   Call ENT for follow up since you have had more infections since you saw them last  Referral to a new primary care provider  Chest x ray today   Follow up in 6 weeks after PFTs with Dr. Everardo All (1st) or Katie Lachell Rochette,NP. If symptoms do not improve or worsen, please contact office for sooner follow up or seek emergency care.

## 2023-03-03 DIAGNOSIS — J411 Mucopurulent chronic bronchitis: Secondary | ICD-10-CM | POA: Insufficient documentation

## 2023-03-03 DIAGNOSIS — J329 Chronic sinusitis, unspecified: Secondary | ICD-10-CM | POA: Insufficient documentation

## 2023-03-03 NOTE — Assessment & Plan Note (Signed)
See above. Symptoms resolved today. I am going to step her up to antihistamine/intranasal steroid combo. Encouraged her to see ENT for further workup/management.

## 2023-03-03 NOTE — Assessment & Plan Note (Signed)
Episodes of recurrent bronchitis following URI in December 2023. She is responsive to steroids/abx. Possible she has developed a reactive airway/asthmatic response or her respiratory symptoms could be coming from recurrent sinusitis. She is clinically improved today after completing empiric doxy and prednisone a week ago. FeNO is nl but this could be due to recent steroids/resolution of symptoms. I think it would be appropriate to start her on ICS/LABA therapy for maintenance at this point. Medication education provided. Teachback performed. We should also obtain CXR as she has not completed any images and repeat PFTs. Could consider CT chest imaging dependent on results/response to above plan to rule out development of ILD or chronic atypical infection. Would also recommend obtaining sputum cultures, should her symptoms recur. Hold off on labs today as she just completed prednisone; will likely obtain IgE and eosinophil for baseline when she returns. Action plan in place.  Patient Instructions  Continue allegra daily Continue saline rinses 1-2 times a day for nasal congestion  Continue Eliquis 5 mg Twice daily  Continue omeprazole 1 tab Twice daily   Albuterol inhaler 2 puffs every 6 hours as needed for shortness of breath or wheezing.  Start Symbicort 2 puffs Twice daily. Brush tongue and rinse mouth afterwards. This is your new maintenance inhaler that you will use daily.  Fluticasone/azelastine nasal spray 1 spray each nostril Twice daily for nasal congestion/drainage   Call ENT for follow up since you have had more infections since you saw them last  Referral to a new primary care provider  Chest x ray today   Follow up in 6 weeks after PFTs with Dr. Everardo All (1st) or Katie Julies Carmickle,NP. If symptoms do not improve or worsen, please contact office for sooner follow up or seek emergency care.

## 2023-03-05 ENCOUNTER — Encounter: Payer: Self-pay | Admitting: Nurse Practitioner

## 2023-04-09 IMAGING — DX DG HIP (WITH OR WITHOUT PELVIS) 2-3V*R*
3 series · 3 of 3 positions shown · non-contrast
Comparison: None.

CLINICAL DATA: Lateral hip pain

EXAM:
DG HIP (WITH OR WITHOUT PELVIS) 2-3V RIGHT

[hip ap]
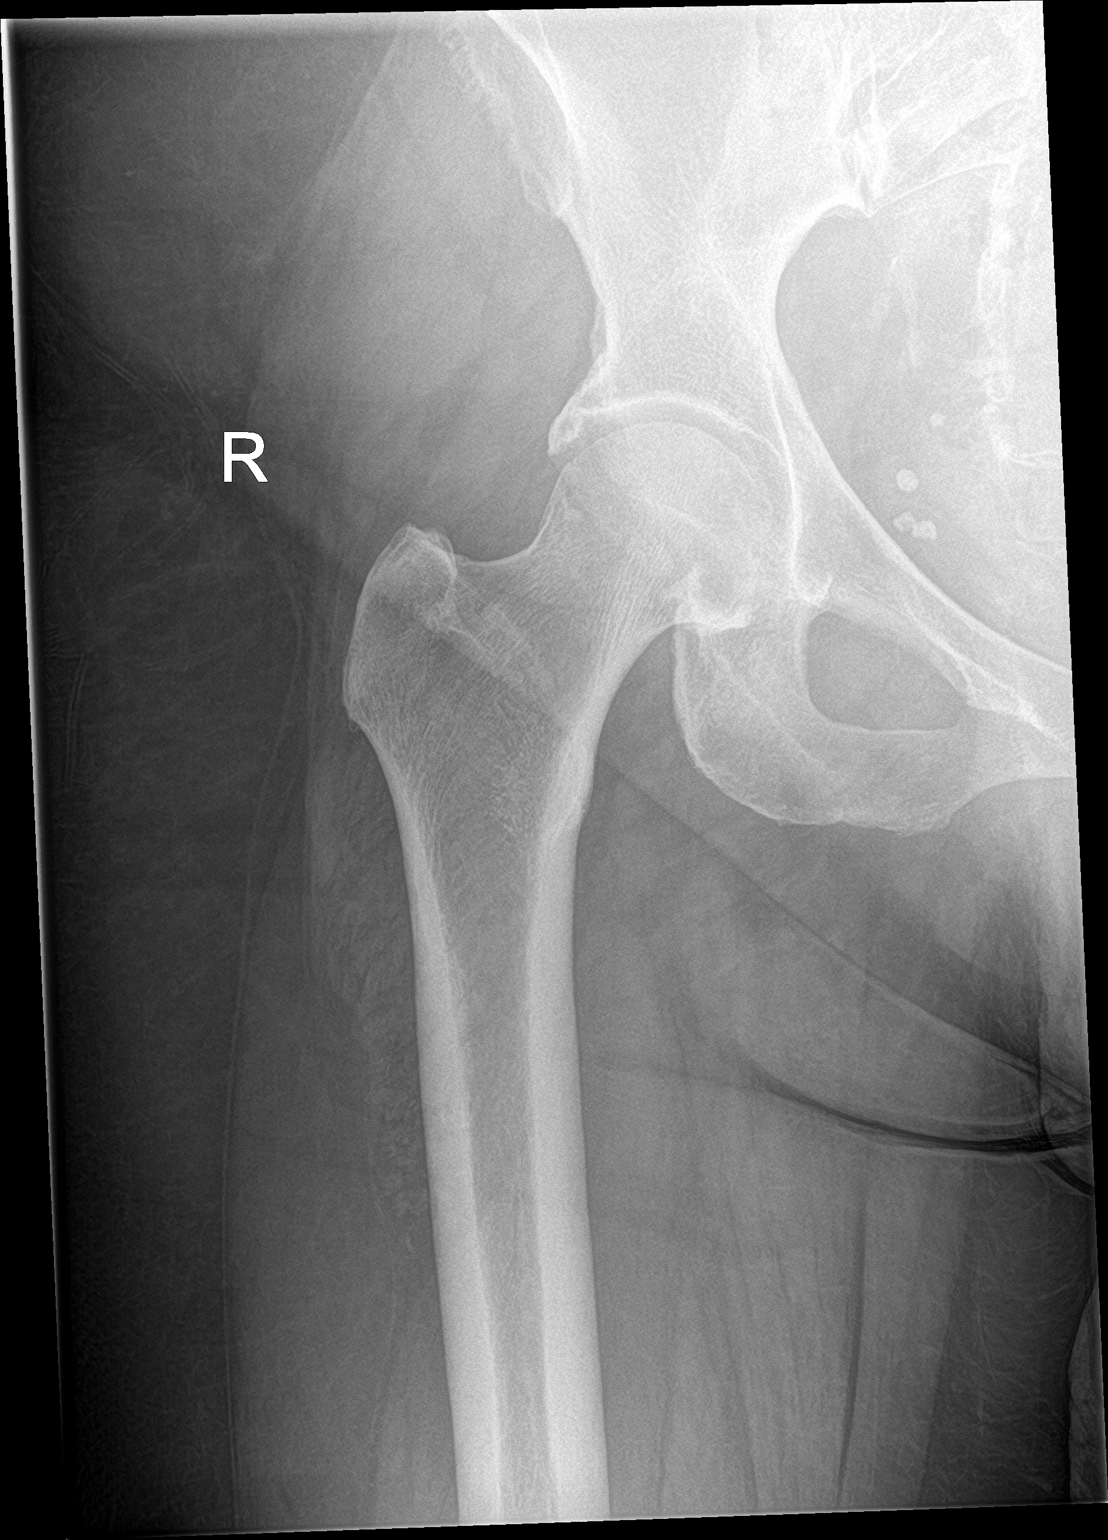

[hip lat]
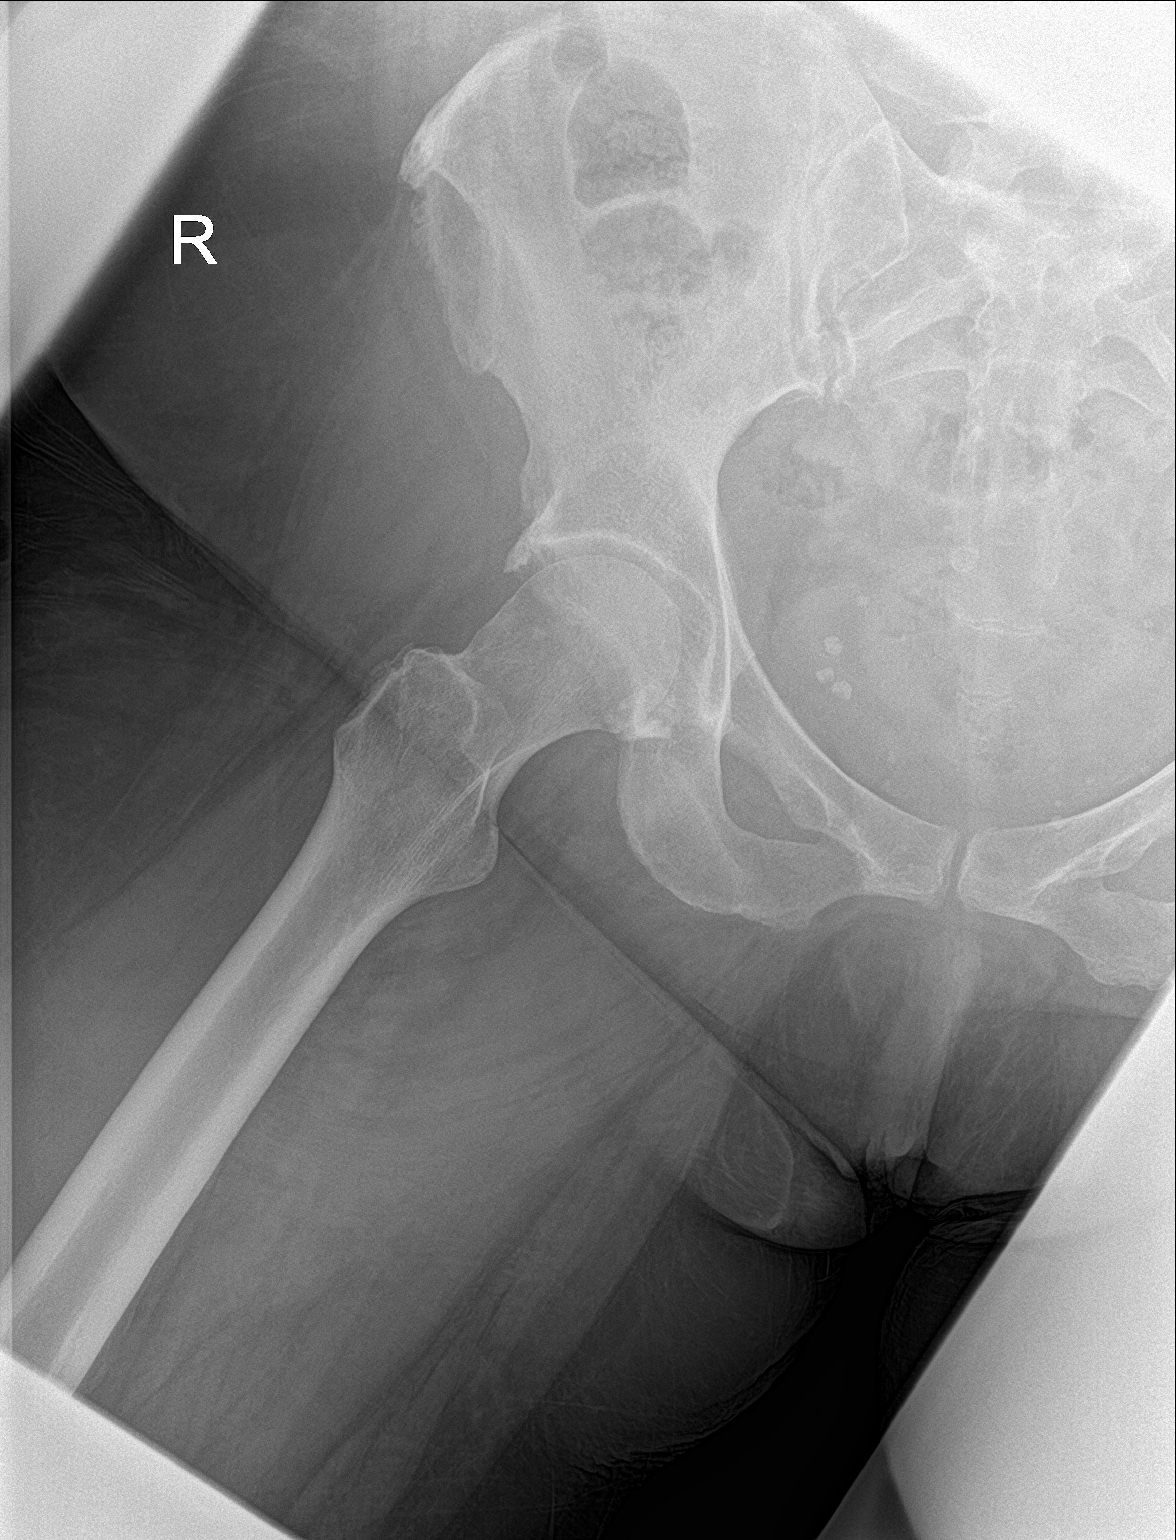

[pelvis ap]
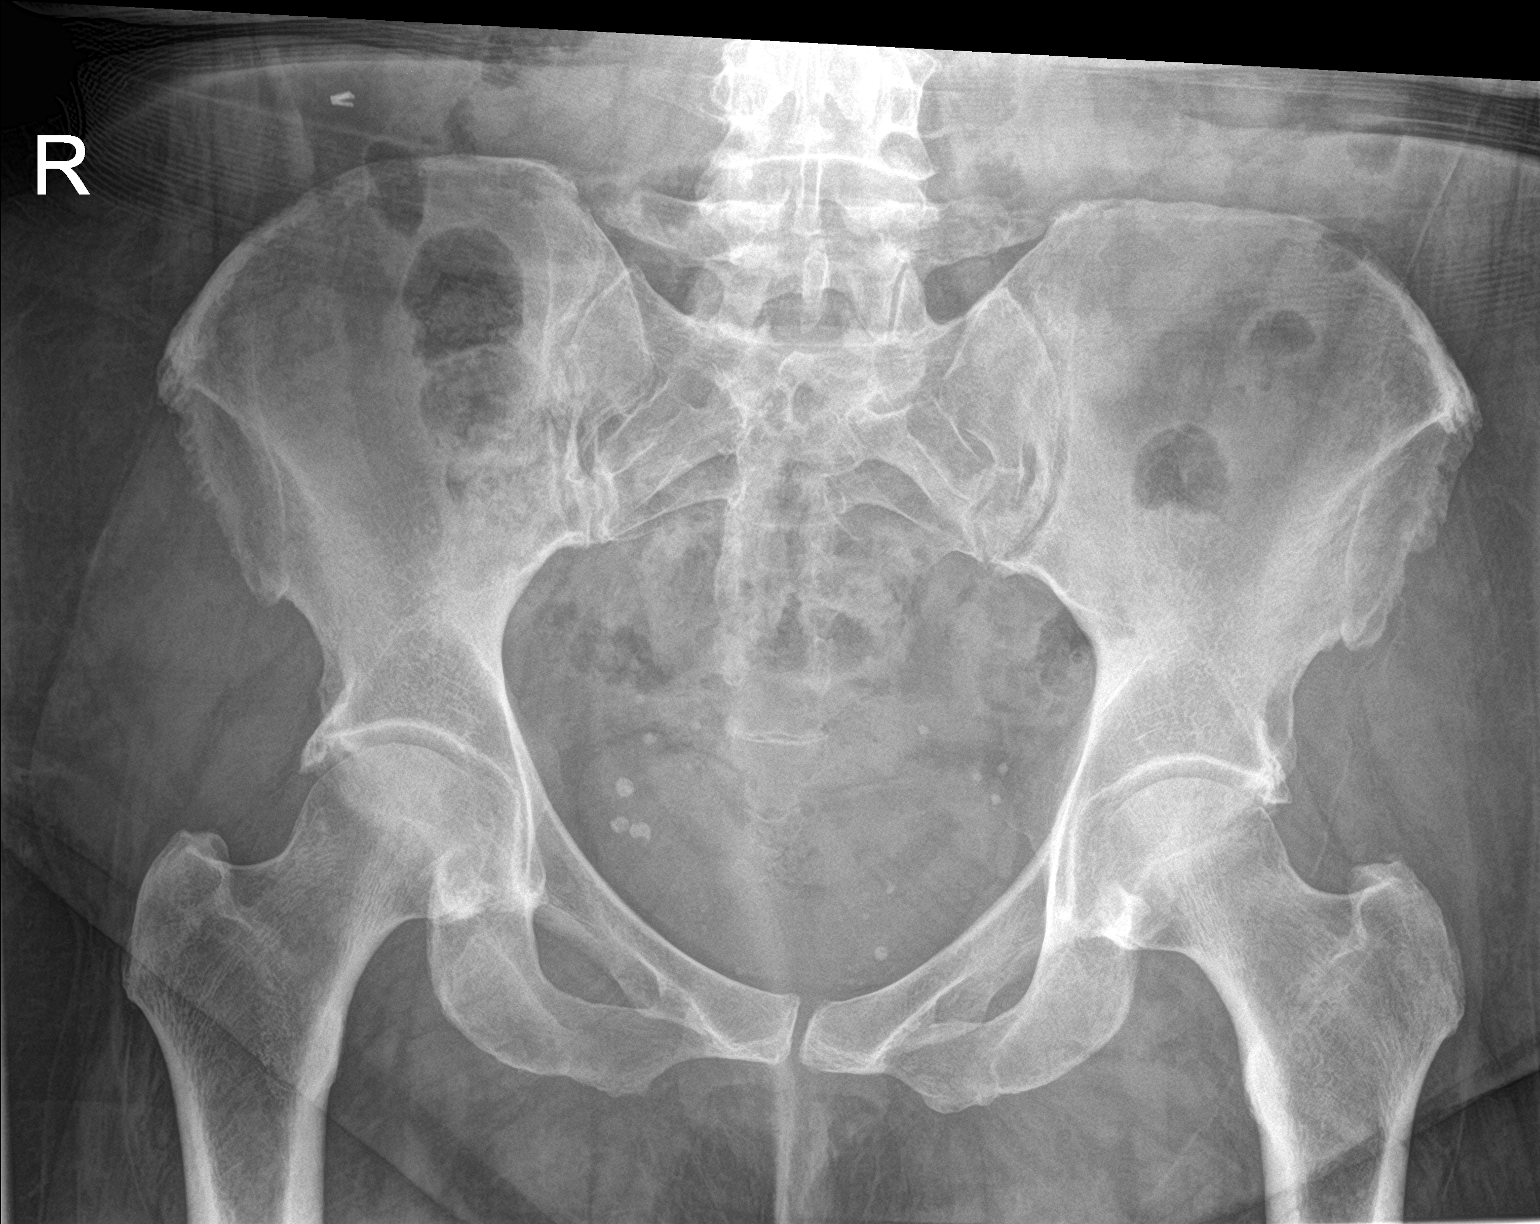

[3 of 3 positions shown; findings below may reference images not displayed]

FINDINGS: No acute fracture. No malalignment. Hip joint space appears
preserved. Acetabular osteophytosis. No bone lesion identified. Soft
tissues appear within normal limits.
IMPRESSION: Mild degenerative changes of the right hip.  No acute findings.

## 2023-04-09 IMAGING — CT CT HIP*R* W/O CM
2 of 4 series · 16 of 46 positions shown, 18 images · non-contrast
Comparison: 01/31/2021

CLINICAL DATA: Right hip pain, injury, inability to bear weight

EXAM:
CT OF THE RIGHT HIP WITHOUT CONTRAST
TECHNIQUE: Multidetector CT imaging of the right hip was performed according to
the standard protocol. Multiplanar CT image reconstructions were
also generated.

[Series 8: coronal st · coronal · 0.38mm/px · 3 of 142 slices shown]
[im 48/142  soft-tissue]
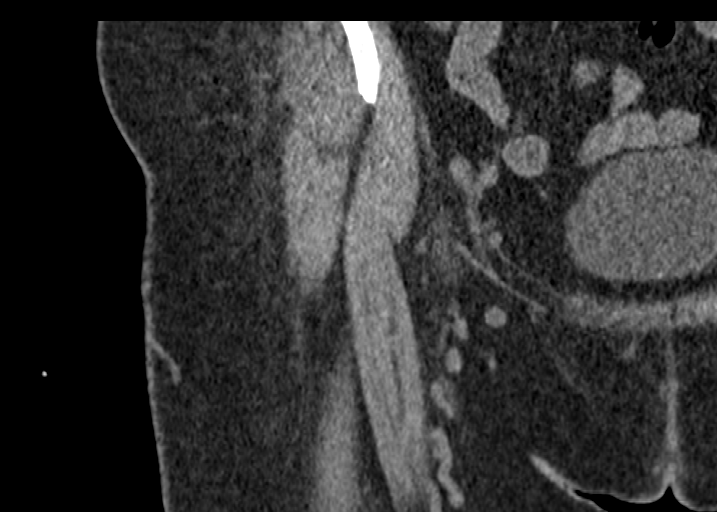
[im 63/142  soft-tissue]
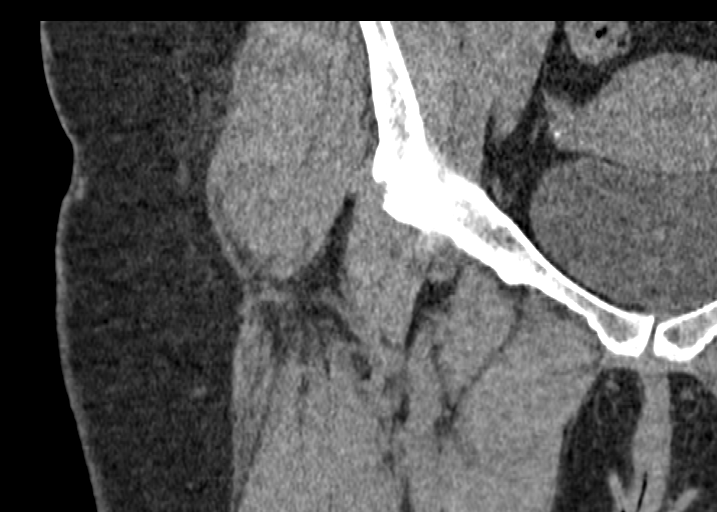
[im 79/142  soft-tissue]
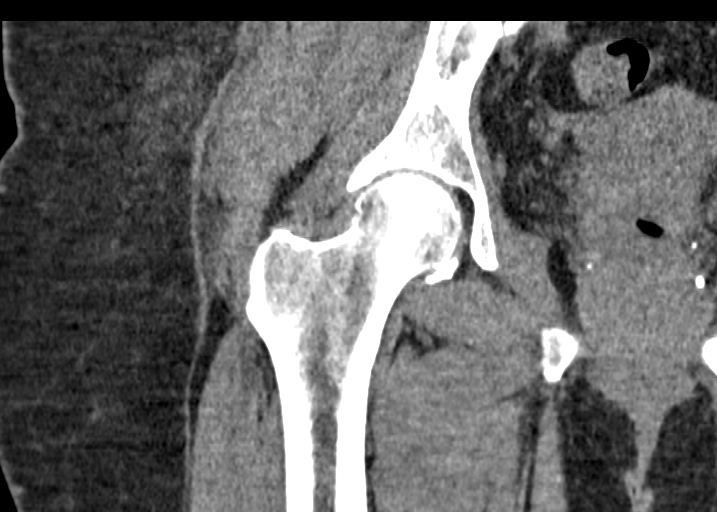

[Series 10: right hip thin · axial · 0.58mm/px · z∈[+760,+932]mm · 13 of 311 slices shown, 15 images]
[im 13/311  soft-tissue]
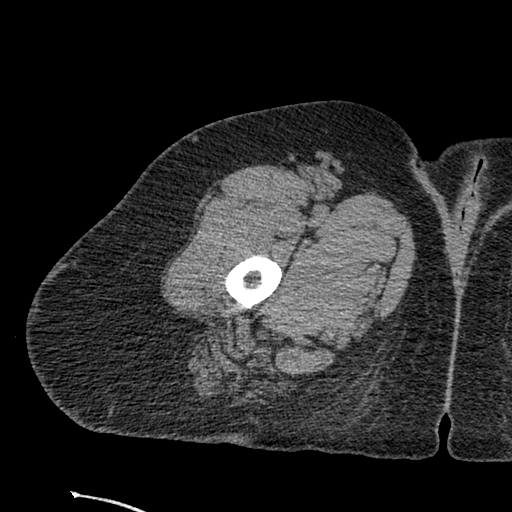
[im 13/311  bone]
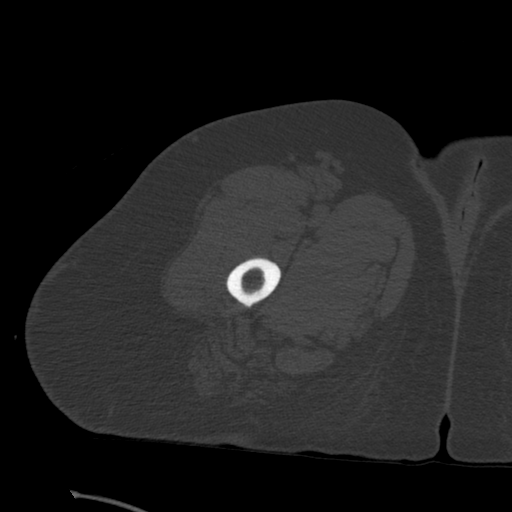
[im 39/311  soft-tissue]
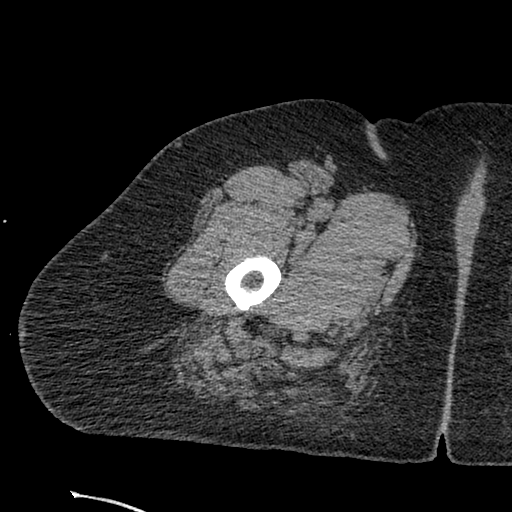
[im 65/311  soft-tissue]
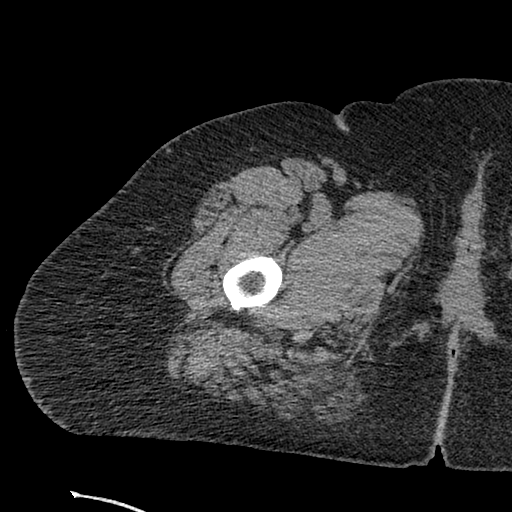
[im 91/311  soft-tissue]
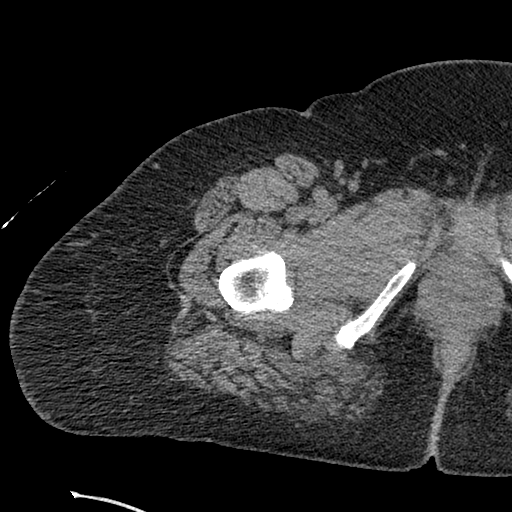
[im 104/311  soft-tissue]
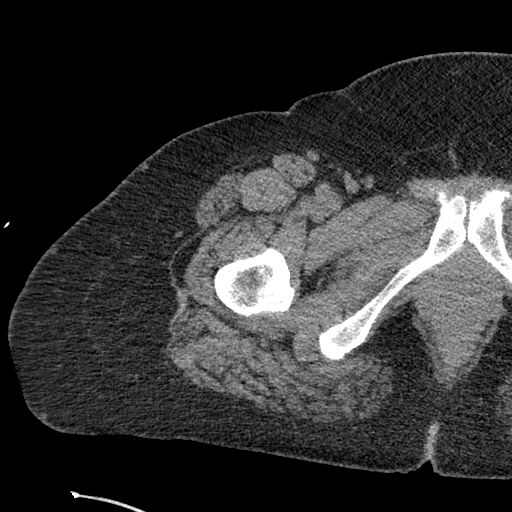
[im 130/311  soft-tissue]
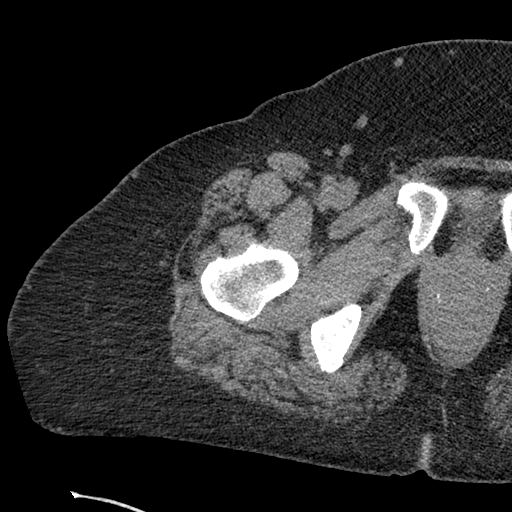
[im 156/311  soft-tissue]
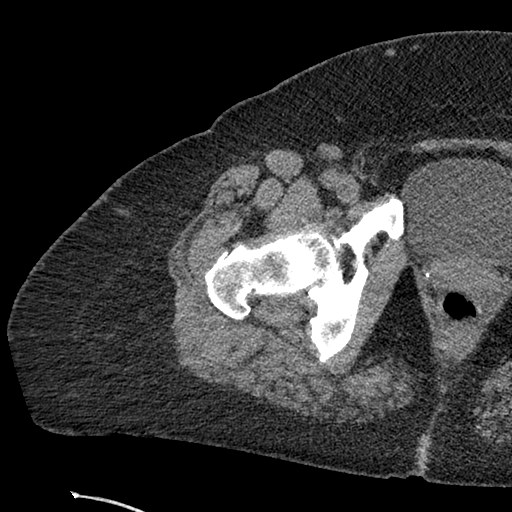
[im 181/311  soft-tissue]
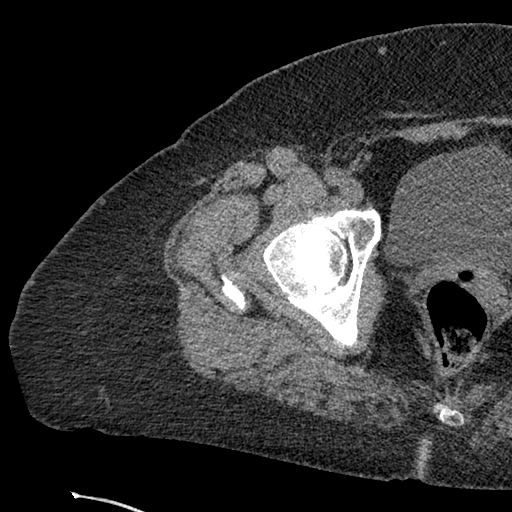
[im 207/311  soft-tissue]
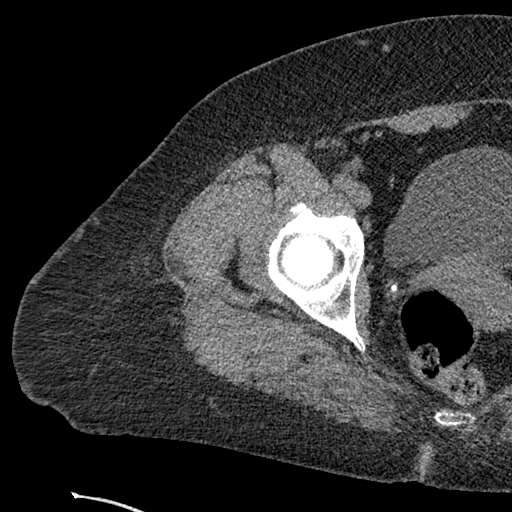
[im 207/311  bone]
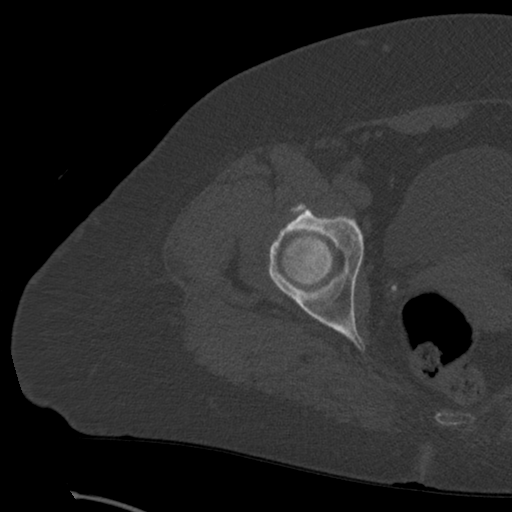
[im 220/311  soft-tissue]
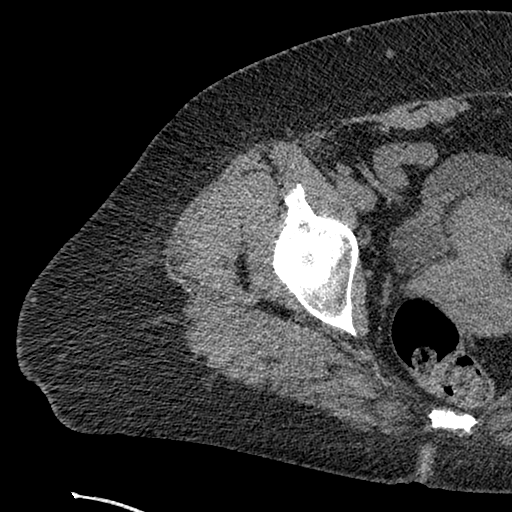
[im 246/311  soft-tissue]
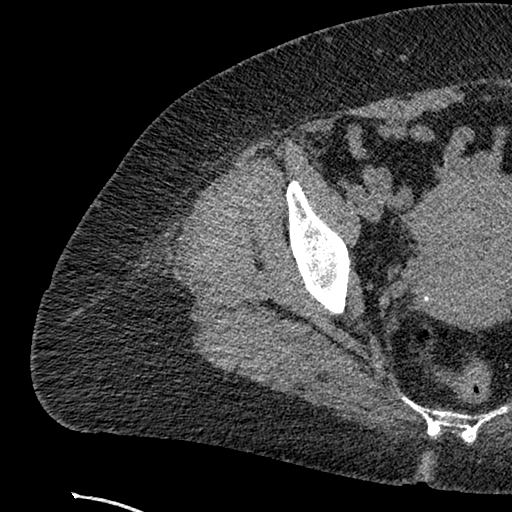
[im 272/311  soft-tissue]
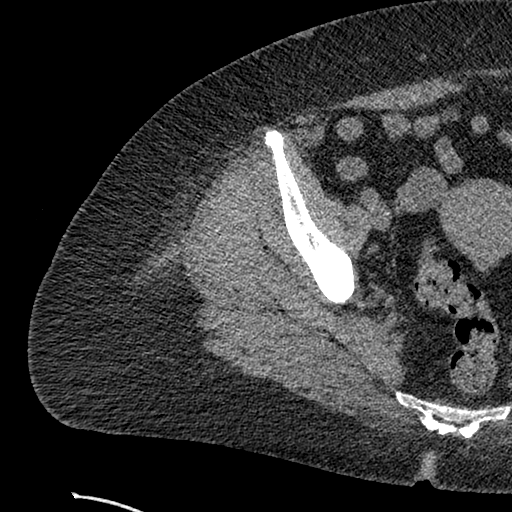
[im 298/311  soft-tissue]
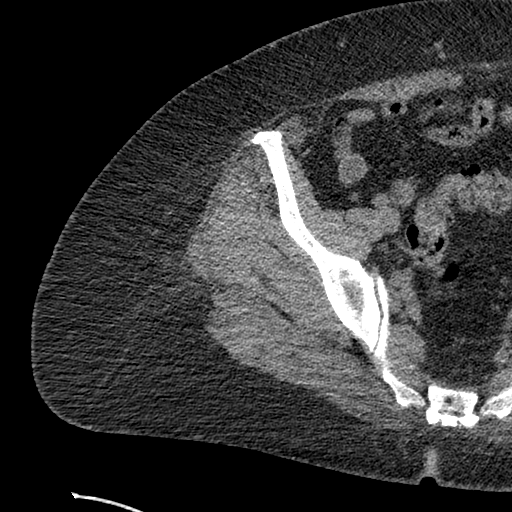

[16 of 46 positions shown; findings below may reference images not displayed]

FINDINGS: Bones/Joint/Cartilage

No acute displaced fracture. The right hip is well aligned, with
mild joint space narrowing and osteophyte formation. No joint
effusion.

Ligaments

Suboptimally assessed by CT.

Muscles and Tendons

No gross abnormalities.

Soft tissues

Visualized portions of the bowel are unremarkable. Bladder is
grossly normal. Uterus is age-appropriate. No adnexal masses.

Remaining soft tissues are unremarkable. Reconstructed images
demonstrate no additional findings.
IMPRESSION: 1. Mild right hip osteoarthritis.  No acute displaced fracture.

## 2023-04-13 ENCOUNTER — Ambulatory Visit (HOSPITAL_COMMUNITY)
Admission: RE | Admit: 2023-04-13 | Discharge: 2023-04-13 | Disposition: A | Discharge status: Home / Self Care | Payer: BC Managed Care – PPO | Source: Ambulatory Visit | Attending: Nurse Practitioner | Admitting: Nurse Practitioner

## 2023-04-13 DIAGNOSIS — J411 Mucopurulent chronic bronchitis: Secondary | ICD-10-CM | POA: Diagnosis present

## 2023-04-13 LAB — PULMONARY FUNCTION TEST
DL/VA % pred: 99 %
DL/VA: 4.16 ml/min/mmHg/L
DLCO unc % pred: 98 %
DLCO unc: 21.69 ml/min/mmHg
FEF 25-75 Pre: 3.13 L/sec
FEF2575-%Pred-Pre: 116 %
FEV1-%Pred-Pre: 105 %
FEV1-Pre: 3.03 L
FEV1FVC-%Pred-Pre: 101 %
FEV6-%Pred-Pre: 105 %
FEV6-Pre: 3.78 L
FEV6FVC-%Pred-Pre: 103 %
FVC-%Pred-Pre: 102 %
FVC-Pre: 3.79 L
Pre FEV1/FVC ratio: 80 %
Pre FEV6/FVC Ratio: 100 %
RV % pred: 70 %
RV: 1.39 L
TLC % pred: 99 %
TLC: 5.35 L

## 2023-04-13 MED ORDER — ALBUTEROL SULFATE (2.5 MG/3ML) 0.083% IN NEBU: 2.5000 mg | INHALATION_SOLUTION | Freq: Once | RESPIRATORY_TRACT | Status: DC

## 2023-04-14 ENCOUNTER — Encounter (HOSPITAL_BASED_OUTPATIENT_CLINIC_OR_DEPARTMENT_OTHER): Payer: BC Managed Care – PPO

## 2023-04-14 ENCOUNTER — Other Ambulatory Visit (HOSPITAL_BASED_OUTPATIENT_CLINIC_OR_DEPARTMENT_OTHER): Payer: Self-pay

## 2023-04-14 ENCOUNTER — Encounter (HOSPITAL_BASED_OUTPATIENT_CLINIC_OR_DEPARTMENT_OTHER): Payer: Self-pay | Admitting: Pulmonary Disease

## 2023-04-14 ENCOUNTER — Ambulatory Visit (HOSPITAL_BASED_OUTPATIENT_CLINIC_OR_DEPARTMENT_OTHER): Payer: BC Managed Care – PPO | Admitting: Pulmonary Disease

## 2023-04-14 VITALS — BP 128/76 | HR 69 | Ht 66.0 in | Wt 244.0 lb

## 2023-04-14 DIAGNOSIS — J329 Chronic sinusitis, unspecified: Secondary | ICD-10-CM | POA: Diagnosis not present

## 2023-04-14 MED ORDER — AMOXICILLIN-POT CLAVULANATE ER 1000-62.5 MG PO TB12
2.0000 | ORAL_TABLET | Freq: Two times a day (BID) | ORAL | 0 refills | Status: AC
  Filled 2023-04-14: qty 40, 10d supply, fill #0

## 2023-04-14 NOTE — Patient Instructions (Addendum)
Recurrent sinusitis Partial improvement on last course of Augmentin --START high dose Augmentin 2g twice a day for 10 days --Please contact your ENT regarding follow-up and scheduling CT  --If you develop diarrhea, can take over-the-counter loperamide as directed  Recent COVID-19 infection with pulmonary opacity/nodule - resolved Shortness of breath Deconditioning --Reviewed PFTs. Normal --CONTINUE Albuterol AS NEEDED for shortness of breath or wheezing --START Symbicort TWO puffs in the morning and evening when ill. Rinse mouth out after use.

## 2023-04-14 NOTE — Progress Notes (Signed)
Subjective:   PATIENT ID: Ruth Matthews GENDER: female DOB: [birth year], MRN: [id number]   HPI  Chief Complaint  Patient presents with   Follow-up    F/U after PFT (completed yesterday at hospital). States her breathing has been stable since last visit. Has noticed that her head congestion and ear pressure have returned since finishing the antibiotic a few days ago.     Reason for Visit: Follow-up  Ruth Matthews is a 57 year old female with fibromyalgia, recent PE, hx DVT in 2012 who presents for follow-up.  Synopsis Initially seen for PE in October after a recent long drive from [place] (08/02/21) and COVID vaccination (Oct 11). She was treated for post-op DVT with warfarin x 3 months in 2012. Hematology with negative hypercoag panel at the time. After her PE diagnosis she is now on lifetime anticoagulation per Hematology. She is compliant with Eliquis.   She was seen by her PCP on 09/09/21. She has had ongoing dyspnea on exertion chest discomfort dizziness and fatigue. Today she reports she has improved dyspnea by 80% at rest only feels 60% with exertion compared to baseline with persistent symptoms that have not changed.  Worsens with activity, bending over and lifting objects <10 lbs (cat). Shortness of breath with laying down has been more difficult. Some wheezing at night. Some coughing during the day where she is clearing her throat. Recent nasal congestion. At baseline, she has seasonal allergies and able to exercise everyday for 30 - 60 minutes ([employer]: elliptical, walking, hiking). After the PE, she completely stopped activity. She has returned to exercise every other day due to fatigue. Denies any recent viral illnesses. For her allergies she is taking allegra, flonase, neti pot. She reports she has ongoing lower extremity pain that has worsened.  11/24/21 Since her last visit she was diagnosed with COVID on 11/11/2021. I had called her and discussed strategies to  improve bleeding including Mucinex, deep breaths, daily activity and prone positioning.  Her oxygen had improved to around 95% with activity but would go to low 90s at rest. Last week she had chest tightness and back pain that has improved. She feels like her left lung is sometimes having pain with deep breaths. Has some wheezing or coughing. She continues to have chills, no fevers. She has been attempting to exercise daily with an elliptical, bike or walking. She is using i[employer] to motivate her with exercise. She reports left knee pain and behind on her posterior gluteus.  12/23/21 Since her last visit she was treated with a course of Augmentin with a CT chest follow-up and echocardiogram for further evaluation.  She tried using her albuterol twice a day but stopped recently with no change in her shortness of breath. Exercise is still not to the level she was before. She continues to have lower extremity swelling that worsens with periods of sitting and standing.  04/14/23 Since our last visit she was seen by NP Cobb in Pulmonary for acute bronchitis, no antibiotics given then. She went to an urgent care completed another course by Augmentin x 10 days that was completed 3 days ago. She is developing has nasal and sinus congestion, tender neck and chills. Currently not on Symbicort but not having pulmonary symptoms. Usually develops symptoms if sinus symptoms are prolonged. 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 Anne Fu, Dr. Graciela Husbands   Obesity    Ovarian cyst    ruptured   [facility] spotted fever) 2004   history of    Varicose veins    Ventricular tachycardia (HCC)    Vitamin D deficiency      Family History  Problem Relation Age of Onset    Hypertension Mother    COPD Mother    Other Father        died from accident, h/o lyme disease   Colon cancer Neg Hx    Esophageal cancer Neg Hx      Social History   Occupational History   Not on file  Tobacco Use   Smoking status: Never   Smokeless tobacco: Never  Vaping Use   Vaping Use: Never used  Substance and Sexual Activity   Alcohol use: Yes    Alcohol/week: 0.0 standard drinks of alcohol    Comment: occasionally   Drug use: No   Sexual activity: Yes    Partners: Male    Comment: 1st intercourse-22, partners- 3, married- 30 yrs     Allergies  Allergen Reactions   Garlic Swelling    Tongue swelling and sensation of throat swelling Tongue swelling and sensation of throat swelling    Latex Itching, Swelling and Rash   Azithromycin     Upset stomach    Mixed Grasses    Molds & Smuts    Onion Swelling    Tongue swelling and sensation of throat swelling Tongue swelling and sensation of throat swelling    Other     "whitening toothpaste"  "earring metals," "nickels and metals"    Levaquin [Levofloxacin]      Outpatient Medications Prior to Visit  Medication Sig Dispense Refill   albuterol (VENTOLIN HFA) 108 (90 Base) MCG/ACT inhaler Inhale 2 puffs into the lungs every 6 (six) hours as needed for wheezing or shortness of breath. 8 g 2   Azelastine-Fluticasone 137-50 MCG/ACT SUSP Place 1 spray into the nose in the morning and at bedtime. 23 g 5   budesonide-formoterol (SYMBICORT) 80-4.5 MCG/ACT inhaler Inhale 2 puffs into the lungs in the morning and at bedtime. 1 each 5   cholecalciferol (VITAMIN D) 400 units/mL SOLN Take by mouth daily at 6 (six) AM.     ELIQUIS 5 MG TABS tablet Take 5 mg by mouth 2 (two) times daily.     EPINEPHrine 0.3 mg/0.3 mL IJ SOAJ injection      fexofenadine (ALLEGRA) 180 MG tablet Take 4 tablets by mouth daily.     Multiple Vitamin (MULTIVITAMIN ADULT PO) Take 1 tablet by mouth daily.     omeprazole (PRILOSEC) 20 MG capsule Take 1  capsule (20 mg total) by mouth 2 (two) times daily. For 4 weeks than decrease to daily 180 capsule 2   Propylene Glycol (SYSTANE BALANCE) 0.6 % SOLN Place 1 drop into both eyes daily as needed (dry eyes).     verapamil (CALAN-SR) 240 MG CR tablet Take 1 tablet (240 mg total) by mouth daily. 90 tablet 3   No facility-administered medications prior to visit.    Review of Systems  Constitutional:  Negative for chills, diaphoresis, fever, malaise/fatigue and weight loss.  HENT:  Positive for congestion.   Respiratory:  Negative for cough, hemoptysis, sputum production, shortness of breath and wheezing.   Cardiovascular:  Negative for  chest pain, palpitations and leg swelling.  Musculoskeletal:  Positive for neck pain.     Objective:   Vitals:   04/14/23 0949  BP: 128/76  Pulse: 69  SpO2: 97%  Weight: 244 lb (110.7 kg)  Height: 5\' 6"  (1.676 m)  Body mass index is 39.38 kg/m.  SpO2: 97 % (on RA)  Physical Exam: General: Well-appearing, no acute distress HENT: , AT Eyes: EOMI, no scleral icterus Respiratory: Clear to auscultation bilaterally.  No crackles, wheezing or rales Cardiovascular: RRR, -M/R/G, no JVD Extremities:-Edema,-tenderness Neuro: AAO x4, CNII-XII grossly intact Psych: Normal mood, normal affect   Data Reviewed:  Imaging: CTA 08/05/21 - Multiple bilateral pulmonary emboli in the upper and lower lobes bilaterally CXR 11/24/2021-no focal consolidation.  Possible nodular opacity in the right lower lung. CT chest 12/17/2021-No pulmonary nodule noted. No significant GGO, septal thickening, subpleural reticulation, traction bronchiectasis or honeycombing present.  PFT: 10/17/2021 FVC 3.77 (101%) FEV1 3.08 (105%) ratio 81 TLC 101% DLCO 101% Interpretation: normal spirometry with no evidence of obstructive or restrictive defect  04/13/23 FVC 3.79 (102%) FEV1 3.03 (105%) Ratio 80  TLC 99% DLCO 98% Interpretation: Normal spirometry  Echo: 12/01/2021-EF 55 to 60%.  No WMA.  Normal diastolic parameters.  RV function and size is normal.  PASP no valvular abnormalities  Labs: CBC    Component Value Date/Time   WBC 5.2 07/24/2020 0912   WBC 6.9 09/20/2019 1230   RBC 5.03 07/24/2020 0912   RBC 5.28 (H) 09/20/2019 1230   HGB 13.7 07/24/2020 0912   HCT 41.3 07/24/2020 0912   PLT 218 07/24/2020 0912   MCV 82 07/24/2020 0912   MCH 27.2 07/24/2020 0912   MCH 26.5 09/20/2019 1230   MCHC 33.2 07/24/2020 0912   MCHC 31.7 09/20/2019 1230   RDW 14.6 07/24/2020 0912   LYMPHSABS 1.3 09/04/2019 0815   MONOABS 0.5 09/04/2019 0815   EOSABS 0.2 09/04/2019 0815   BASOSABS 0.0 09/04/2019 0815   Absolute eos 09/04/19 -200   Assessment & Plan:   Discussion: 57 year old female never smoker with fibromyalgia, history of PE/DVT on Eliquis who presents for follow-up.  Symptoms consistent with acute recurrent sinusitis despite recent antibiotic administration.  Recent PFTs demonstrate normal spirometry however does benefit from bronchodilators during illness.  Counseled on use in setting of acute illness for symptom relief  Recurrent sinusitis Partial improvement on last course of Augmentin --START high dose Augmentin 2g twice a day for 10 days --Please contact your ENT regarding follow-up and scheduling CT  --If you develop diarrhea, can take over-the-counter loperamide as directed  Recent COVID-19 infection with pulmonary opacity/nodule - resolved Shortness of breath 2/2 deconditioning --Reviewed PFTs. Normal --CONTINUE Albuterol AS NEEDED for shortness of breath or wheezing --START Symbicort TWO puffs in the morning and evening when ill. Rinse mouth out after use.  Hx Pulmonary embolism 2022/DVT 2012 --Eliquis per hematology  Health Maintenance Immunization History  Administered Date(s) Administered   Influenza Inj Mdck Quad Pf 07/27/2019   Influenza, High Dose Seasonal PF 01/03/2020   Influenza,inj,Quad PF,6+ Mos 08/12/2018   PFIZER Comirnaty(Gray  Top)Covid-19 Tri-Sucrose Vaccine 12/18/2019   PFIZER(Purple Top)SARS-COV-2 Vaccination 12/18/2019, 01/03/2020   Pfizer Covid-19 Vaccine Bivalent Booster 25yrs & up 07/29/2021   Tdap 07/21/2021   CT Lung Screen - not indicated  No orders of the defined types were placed in this encounter.  Meds ordered this encounter  Medications   amoxicillin-clavulanate (AUGMENTIN XR) 1000-62.5 MG 12 hr tablet    Sig: Take 2 tablets by  mouth 2 (two) times daily for 10 days.    Dispense:  40 tablet    Refill:  0    Return in about 6 months (around 10/14/2023).   I have spent a total time of 30-minutes on the day of the appointment reviewing prior documentation, coordinating care and discussing medical diagnosis and plan with the patient/family. Past medical history, allergies, medications were reviewed. Pertinent imaging, labs and tests included in this note have been reviewed and interpreted independently by me.  Paulmichael Schreck Mechele Collin, MD [facility] 04/14/2023 10:06 AM  Office Number [phone]

## 2023-04-17 ENCOUNTER — Encounter (HOSPITAL_BASED_OUTPATIENT_CLINIC_OR_DEPARTMENT_OTHER): Payer: Self-pay | Admitting: Pulmonary Disease

## 2023-05-28 ENCOUNTER — Other Ambulatory Visit: Payer: Self-pay | Admitting: Family Medicine

## 2023-05-28 DIAGNOSIS — Z1231 Encounter for screening mammogram for malignant neoplasm of breast: Secondary | ICD-10-CM

## 2023-07-28 NOTE — Progress Notes (Deleted)
Cardiology Office Note:    Date:  07/28/2023   ID:  Ruth Matthews, DOB Nov 29, 1965, MRN [id number]  PCP:  [facility]  Cardiologist:  Meriam Sprague, MD  Advanced Practice Provider:  No care team member to display Electrophysiologist:  None    Referring MD: No ref. provider found    History of Present Illness:    Ruth Matthews is a 57 y.o. female with a hx of DVT, HTN, fascicular VT and HLD who was previously followed by Dr. Delton See who now returns to clinic for follow-up.  She was previously seen by Dr Anne Fu for palpitations and exertional presyncope at a gym in 2012. She was diagnosed with a wide complex tachycardia negatively deflecting QRS complexes in the lateral leads as well as right axis deviation and negative deflection in II, III and aVF. She was given an IV and amiodarone and converted after about 6 minutes. Briefly she was in atrial fibrillation postconversion and then remained in sinus rhythm for the hospitalization. Dr. Graciela Husbands recommended verapamil for treatment of this left fascicular VT. Cardiac MRI was unremarkable. Echocardiogram unremarkable. Cardiac catheterization unremarkable, no CAD.  Saw Dr. Delton See on 07/24/2020 where she was feeling more fatigue and SOB with exertion. Also was having recurrent palpitations with exertion. TTE obtained on 01/06/21 showed normal BiV function and no significant valve disease. She was also recommended for coronary CTA but she cancelled as well as an exercise tolerance test which was not completed.  Repeat TTE 11/2021 with LVEF 55-60%, no WMA, normal RV, mild LAE, no significant valve disease. Cardiac monitor 10/2021 with rare SVE, PVCs <1%, no sustained arrhythmias.   Was seen in clinic 01/2022 where she was recovering from a PE that developed after a [place] ride from [place] and receiving the COVID vaccine. She was having pain in her legs at that time. Fortunately, LE doppler  negative.  Was last seen by Tereso Newcomer on 04/2022 where she was doing well. Decided not to pursue ETT at that time.   Today, the patient states that this past Sunday she noticed her heart beating in a strange rhythm in the setting of dancing. This was notably different from her episode of tachycardia in 2012. She stopped and drank some water, resting until her symptoms resolved. She denies any recurring episodes of arrhythmia since then.  Lately she continues to feel generally fatigued and weak. After finishing her work day she usually needs to take a nap at home for a few hours. Her issues with fatigue have been ongoing since prior to her COVID-19 infection near the end of January 2023.   Typically, she exercises almost every day and feels good without anginal symptoms. In the last couple weeks she has not been exercising as frequently, which she states is new for her. She attributes this to her fatigue and daytime somnolence. At night she is not sleeping well. She complains of nocturnal hot flashes. She declined ETT, CTA or Ca score today.  At home she has fallen out of the habit of monitoring her blood pressure. She would still endorse more controlled readings at home than in the office today (140/80, and 138/89 on recheck). She has been compliant with Eliquis for a year.  Her TSH was 0.89 as of 06/19/22. Unfortunately she is unable to see an endocrinologist until 11/2022.  She denies any chest pain, shortness of breath, or peripheral edema. No lightheadedness, headaches, syncope, orthopnea, or PND.  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 Anne Fu, Dr. Graciela Husbands   Obesity    Ovarian cyst    ruptured   [facility] spotted fever)  2004   history of    Varicose veins    Ventricular tachycardia (HCC)    Vitamin D deficiency     Past Surgical History:  Procedure Laterality Date   CARDIAC CATHETERIZATION     no CAD   CESAREAN SECTION     CHOLECYSTECTOMY     ENDOVENOUS ABLATION SAPHENOUS VEIN W/ LASER Bilateral 02/2011   Both legs a few weeks apart   LIVER BIOPSY      Current Medications: No outpatient medications have been marked as taking for the 07/29/23 encounter (Appointment) with Pricilla Riffle, MD.     Allergies:   Garlic, Latex, Azithromycin, Mixed grasses, Molds & smuts, Onion, Other, and Levaquin [levofloxacin]   Social History   Socioeconomic History   Marital status: Married    Spouse name: Not on file   Number of children: Not on file   Years of education: Not on file   Highest education level: Not on file  Occupational History   Not on file  Tobacco Use   Smoking status: Never   Smokeless tobacco: Never  Vaping Use   Vaping status: Never Used  Substance and Sexual Activity   Alcohol use: Yes    Alcohol/week: 0.0 standard drinks of alcohol    Comment: occasionally   Drug use: No   Sexual activity: Yes    Partners: Male    Comment: 1st intercourse-22, partners- 3, married- 30 yrs   Other Topics Concern   Not on file  Social History Narrative   Not on file   Social Determinants of Health   Financial Resource Strain: Low Risk  (05/31/2023)   Received from [facility]   Overall Financial Resource Strain (CARDIA)    Difficulty of Paying Living Expenses: Not hard at all  Food Insecurity: No Food Insecurity (05/31/2023)   Received from [facility]   Hunger Vital Sign    Worried About Running Out of Food in the Last Year: Never true    Ran Out of Food in the Last Year: Never true  Transportation Needs: No Transportation Needs (05/31/2023)   Received from [facility] - Transportation    Lack of Transportation (Medical): No    Lack of Transportation (Non-Medical): No   Physical Activity: Not on file  Stress: Not on file  Social Connections: Unknown (02/24/2022)   Received from [facility]   Social Network    Social Network: Not on file     Family History: The patient's family history includes COPD in her mother; Hypertension in her mother; Other in her father. There is no history of Colon cancer or Esophageal cancer.  ROS:   Please see the history of present illness.    Review of Systems  Constitutional:  Positive for malaise/fatigue. Negative for chills, fever and weight loss.  HENT:  Negative for hearing loss.   Eyes:  Negative for blurred vision and redness.  Respiratory:  Negative for hemoptysis and sputum production.   Cardiovascular:  Positive for palpitations. Negative for chest pain, orthopnea, claudication, leg swelling and PND.  Gastrointestinal:  Negative for melena, nausea and vomiting.  Genitourinary:  Negative for dysuria and flank pain.  Musculoskeletal:  Negative for falls.  Skin:  Negative for itching and rash.  Neurological:  Positive for weakness. Negative for dizziness and loss of consciousness.  Endo/Heme/Allergies:  Negative for polydipsia.  Psychiatric/Behavioral:  Negative for substance abuse.     EKGs/Labs/Other Studies Reviewed:    The following studies were reviewed today:  TTE 11/2021: IMPRESSIONS   1. Left ventricular ejection fraction, by estimation, is 55 to 60%. Left  ventricular ejection fraction by 2D MOD biplane is 58.5 %. Left  ventricular ejection fraction by PLAX is 57 %. The left ventricle has  normal function. The left ventricle has no  regional wall motion abnormalities. Left ventricular diastolic parameters  were normal.   2. Right ventricular systolic function is normal. The right ventricular  size is normal. There is normal pulmonary artery systolic pressure.   3. Left atrial size was mildly dilated.   4. The mitral valve is normal in structure. No evidence of mitral valve   regurgitation. No evidence of mitral stenosis.   5. The aortic valve is tricuspid. Aortic valve regurgitation is not  visualized. No aortic stenosis is present.   6. The inferior vena cava is normal in size with <50% respiratory  variability, suggesting right atrial pressure of 8 mmHg.   Comparison(s): EF 60%.  Cardiac Monitor 10/2021: Patch wear time was 13 days and 15 hours Predominant rhythm was NSR with average HR 76bpm Rare SVE and rare PVCs (<1%) Patient triggered events correlate with sinus rhythm No sustained arrhythmias or significant pauses     Patch Wear Time:  13 days and 15 hours (2022-12-16T15:55:56-0500 to 2022-12-30T07:38:22-0500)   Patient had a min HR of 45 bpm, max HR of 177 bpm, and avg HR of 76 bpm. Predominant underlying rhythm was Sinus Rhythm. Isolated SVEs were rare (<1.0%), SVE Couplets were rare (<1.0%), and SVE Triplets were rare (<1.0%). Isolated VEs were rare (<1.0%,  4358), VE Triplets were rare (<1.0%, 2), and no VE Couplets were present. Ventricular Trigeminy was present.   TTE 01/06/21: IMPRESSIONS   1. Left ventricular ejection fraction, by estimation, is 60 to 65%. Left  ventricular ejection fraction by 3D volume is 60 %. The left ventricle has  normal function. The left ventricle has no regional wall motion  abnormalities. Left ventricular diastolic   parameters were normal.   2. Right ventricular systolic function is normal. The right ventricular  size is normal. There is normal pulmonary artery systolic pressure.   3. The mitral valve is normal in structure. No evidence of mitral valve  regurgitation. No evidence of mitral stenosis.   4. The aortic valve is normal in structure. Aortic valve regurgitation is  not visualized. No aortic stenosis is present.   5. The inferior vena cava is normal in size with greater than 50%  respiratory variability, suggesting right atrial pressure of 3 mmHg.   Cardiac MRI 06/17/19: Findings:  All 4 cardiac  chambers were normal in size and function.  There was no ASD, VSD or pericardial effusion.  Mitral, aortic and  tricuspid valves appeared morphologically normal.  Quantitative EF  was 61% ( EDV-119cc, ESV-47cc, SV 72cc) Hyperenhancement images  showed no infiltration or scar tissue in the LV     Impression:  1)    Normal cardiac MRI  2)    EF  61% with no RWMA's  3)    No infiltration or hyperenhancement in LV   EKG:  EKG is personally reviewed. 08/06/2022:  Sinus rhythm. Rate 54 bpm.   Recent Labs: No results found for requested labs within last 365 days.   Recent Lipid Panel    Component Value Date/Time   CHOL 190 07/24/2020 0912   TRIG 132 07/24/2020 0912   HDL 76 07/24/2020 0912   CHOLHDL 2.5 07/24/2020 0912   CHOLHDL 3.8 10/29/2010 1019   VLDL 24 10/29/2010 1019   LDLCALC 91 07/24/2020 0912      Physical Exam:    VS:  There were no vitals taken for this visit.    Wt Readings from Last 3 Encounters:  04/14/23 244 lb (110.7 kg)  03/01/23 248 lb 12.8 oz (112.9 kg)  08/06/22 251 lb (113.9 kg)     GEN:  Well nourished, well developed in no acute distress HEENT: Normal NECK: No JVD; No carotid bruits CARDIAC: RRR, no murmurs, rubs, gallops RESPIRATORY:  Clear to auscultation without rales, wheezing or rhonchi  ABDOMEN: Soft, non-tender, non-distended MUSCULOSKELETAL:  No edema; No deformity  SKIN: Warm and dry NEUROLOGIC:  Alert and oriented x 3 PSYCHIATRIC:  Normal affect   ASSESSMENT:    No diagnosis found.    PLAN:    In order of problems listed above:  #PE: #Bilateral LE Pain: #History of DVT Patient developed an acute PE after long drive from [place] and COVID vaccination in 07/2021. Was managed with apixaban alone. Has follow-up with heme next month. -Management per Heme  #Fascicular VT: Diagnosed in 2012. Cardiac MRI normal and cath without obstructive disease. Palpitations overall improved. Has occasional episodes but not sustaining.  Exercising regularly without issues. TTE with normal EF, no significant valve disease. She will continue to monitor with her smart watch and if has recurrence, will repeat monitor at that time. -Continue monitoring with smart watch -Continue verapamil 240mg  daily -If recurrence of symptoms, will proceed with repeat monitor  #DOE:  Thought to be due to deconditioning with reassuring cardiac and pulm work-up. Declined ETT or Ca scoring. TTE with normal BiV function. No anginal or HF symptoms.  -Continue exercise traning  #HLD: LDL 91.  -Continue dietary modifications; lost 45lbs -Will montior  #Obesity: Continues to work on weight loss and has maintained daily exercise and trying to adhere to healthy diet -Continue diet and lifestyle modifications -Will think about GLP-1 agonist  Exercise recommendations: Goal of exercising for at least 30 minutes a day, at least 5 times per week.  Please exercise to a moderate exertion.  This means that while exercising it is difficult to speak in full sentences, however you are not so short of breath that you feel you must stop, and not so comfortable that you can carry on a full conversation.  Exertion level should be approximately a 5/10, if 10 is the most exertion you can perform.  Diet recommendations: Recommend a heart healthy diet such as the Mediterranean diet.  This diet consists of plant based foods, healthy fats, lean meats, olive oil.  It suggests limiting the intake of simple carbohydrates such as white breads, pastries, and pastas.  It also limits the amount of red meat, wine, and dairy products such as cheese that one should consume on a daily basis.  Follow-up:  6 months.   Medication Adjustments/Labs and Tests Ordered: Current medicines are reviewed at length with the patient today.  Concerns regarding medicines are outlined above.  No orders of the defined types were placed in this encounter.  No orders of the defined types were  placed in this encounter.  There are no Patient Instructions on file for this visit.  I,Mathew Stumpf,acting as a Neurosurgeon for Dietrich Pates, MD.,have documented all relevant documentation on the behalf of Dietrich Pates, MD,as directed by  Dietrich Pates, MD while in the presence of Dietrich Pates, MD.  I, Dietrich Pates, MD, have reviewed all documentation for this visit. The documentation on 07/28/23 for the exam, diagnosis, procedures, and orders are all accurate and complete.   Signed, Dietrich Pates, MD  07/28/2023 8:04 PM    [facility]

## 2023-07-29 ENCOUNTER — Ambulatory Visit: Payer: BC Managed Care – PPO | Admitting: Internal Medicine

## 2023-08-03 ENCOUNTER — Encounter (HOSPITAL_BASED_OUTPATIENT_CLINIC_OR_DEPARTMENT_OTHER): Payer: Self-pay

## 2023-08-03 ENCOUNTER — Emergency Department (HOSPITAL_BASED_OUTPATIENT_CLINIC_OR_DEPARTMENT_OTHER)
Admission: EM | Admit: 2023-08-03 | Discharge: 2023-08-03 | Disposition: A | Discharge status: Home / Self Care | Payer: BC Managed Care – PPO | Attending: Emergency Medicine | Admitting: Emergency Medicine

## 2023-08-03 ENCOUNTER — Emergency Department (HOSPITAL_BASED_OUTPATIENT_CLINIC_OR_DEPARTMENT_OTHER): Payer: BC Managed Care – PPO

## 2023-08-03 DIAGNOSIS — R0602 Shortness of breath: Secondary | ICD-10-CM | POA: Diagnosis present

## 2023-08-03 DIAGNOSIS — Z1152 Encounter for screening for COVID-19: Secondary | ICD-10-CM | POA: Diagnosis not present

## 2023-08-03 LAB — CBC WITH DIFFERENTIAL/PLATELET
Abs Immature Granulocytes: 0.01 10*3/uL (ref 0.00–0.07)
Basophils Absolute: 0.1 10*3/uL (ref 0.0–0.1)
Basophils Relative: 1 %
Eosinophils Absolute: 0.1 10*3/uL (ref 0.0–0.5)
Eosinophils Relative: 3 %
HCT: 39.3 % (ref 36.0–46.0)
Hemoglobin: 12.8 g/dL (ref 12.0–15.0)
Immature Granulocytes: 0 %
Lymphocytes Relative: 28 %
Lymphs Abs: 1.2 10*3/uL (ref 0.7–4.0)
MCH: 26.3 pg (ref 26.0–34.0)
MCHC: 32.6 g/dL (ref 30.0–36.0)
MCV: 80.9 fL (ref 80.0–100.0)
Monocytes Absolute: 0.4 10*3/uL (ref 0.1–1.0)
Monocytes Relative: 8 %
Neutro Abs: 2.6 10*3/uL (ref 1.7–7.7)
Neutrophils Relative %: 60 %
Platelets: 186 10*3/uL (ref 150–400)
RBC: 4.86 MIL/uL (ref 3.87–5.11)
RDW: 15.4 % (ref 11.5–15.5)
WBC: 4.4 10*3/uL (ref 4.0–10.5)
nRBC: 0 % (ref 0.0–0.2)

## 2023-08-03 LAB — BASIC METABOLIC PANEL
Anion gap: 14 (ref 5–15)
BUN: 11 mg/dL (ref 6–20)
CO2: 24 mmol/L (ref 22–32)
Calcium: 9.4 mg/dL (ref 8.9–10.3)
Chloride: 102 mmol/L (ref 98–111)
Creatinine, Ser: 0.76 mg/dL (ref 0.44–1.00)
GFR, Estimated: >60 mL/min (ref >60)
Glucose, Bld: 109 mg/dL — ABNORMAL HIGH (ref 70–99)
Potassium: 3.9 mmol/L (ref 3.5–5.1)
Sodium: 140 mmol/L (ref 135–145)

## 2023-08-03 LAB — RESP PANEL BY RT-PCR (RSV, FLU A&B, COVID)  RVPGX2
Influenza A by PCR: NEGATIVE
Influenza B by PCR: NEGATIVE
Resp Syncytial Virus by PCR: NEGATIVE
SARS Coronavirus 2 by RT PCR: NEGATIVE

## 2023-08-03 LAB — BRAIN NATRIURETIC PEPTIDE: B Natriuretic Peptide: 29.4 pg/mL (ref 0.0–100.0)

## 2023-08-03 LAB — D-DIMER, QUANTITATIVE: D-Dimer, Quant: 0.33 ug{FEU}/mL (ref 0.00–0.50)

## 2023-08-03 LAB — TROPONIN I (HIGH SENSITIVITY): Troponin I (High Sensitivity): 4 ng/L (ref <18)

## 2023-08-03 MED ORDER — ALBUTEROL SULFATE HFA 108 (90 BASE) MCG/ACT IN AERS
2.0000 | INHALATION_SPRAY | RESPIRATORY_TRACT | Status: DC | PRN
  Administered 2023-08-03: 2 via RESPIRATORY_TRACT
  Filled 2023-08-03: qty 6.7

## 2023-08-03 NOTE — Discharge Instructions (Signed)
Take steroids as were prescribed to you earlier.  Follow-up with your primary care doctor.  Workup today is unremarkable.

## 2023-08-03 NOTE — ED Provider Notes (Signed)
[place] [facility] Provider Note   CSN: [id number] Arrival date & time: 08/03/23  0801     History  Chief Complaint  Patient presents with   Shortness of Breath    Ruth Matthews is a 57 y.o. female.  Patient here with ongoing cough shortness of breath.  Been on antibiotics for bronchitis.  Felt like her lips change color this morning.  She is having some chest discomfort at times.  She is on blood thinners for blood clot which she is concerned about.  She has not missed any doses of her blood thinners.  Nothing makes it worse or better.  Denies any weakness numbness tingling.  Denies any fever or chills.  She was prescribed steroids but did not take them.  No smoking history.  The history is provided by the patient.       Home Medications Prior to Admission medications   Medication Sig Start Date End Date Taking? Authorizing Provider  albuterol (VENTOLIN HFA) 108 (90 Base) MCG/ACT inhaler Inhale 2 puffs into the lungs every 6 (six) hours as needed for wheezing or shortness of breath. 03/01/23   Cobb, Ruby Cola, NP  Azelastine-Fluticasone 137-50 MCG/ACT SUSP Place 1 spray into the nose in the morning and at bedtime. 03/01/23   Cobb, Ruby Cola, NP  budesonide-formoterol (SYMBICORT) 80-4.5 MCG/ACT inhaler Inhale 2 puffs into the lungs in the morning and at bedtime. 03/01/23   Cobb, Ruby Cola, NP  cholecalciferol (VITAMIN D) 400 units/mL SOLN Take by mouth daily at 6 (six) AM.    [provider]  ELIQUIS 5 MG TABS tablet Take 5 mg by mouth 2 (two) times daily. 09/10/21   [provider]  EPINEPHrine 0.3 mg/0.3 mL IJ SOAJ injection  01/11/20   [provider]  fexofenadine (ALLEGRA) 180 MG tablet Take 4 tablets by mouth daily.    [provider]  Multiple Vitamin (MULTIVITAMIN ADULT PO) Take 1 tablet by mouth daily.    [provider]  omeprazole (PRILOSEC) 20 MG capsule Take 1 capsule (20 mg  total) by mouth 2 (two) times daily. For 4 weeks than decrease to daily 10/16/21   Lynann Bologna, MD  Propylene Glycol (SYSTANE BALANCE) 0.6 % SOLN Place 1 drop into both eyes daily as needed (dry eyes).    [provider]  verapamil (CALAN-SR) 240 MG CR tablet Take 1 tablet (240 mg total) by mouth daily. 08/10/22   Meriam Sprague, MD      Allergies    Garlic, Latex, Azithromycin, Mixed grasses, Molds & smuts, Onion, Other, Penicillins, and Levaquin [levofloxacin]    Review of Systems   Review of Systems  Physical Exam Updated Vital Signs BP (!) 171/91 (BP Location: Right Arm)   Pulse 74   Temp 97.8 F (36.6 C) (Oral)   Resp 18   Ht 5\' 6"  (1.676 m)   Wt 104.3 kg   LMP 04/14/2020   SpO2 100%   BMI 37.12 kg/m  Physical Exam Vitals and nursing note reviewed.  Constitutional:      General: She is not in acute distress.    Appearance: She is well-developed. She is not ill-appearing.  HENT:     Head: Normocephalic and atraumatic.     Mouth/Throat:     Mouth: Mucous membranes are moist.  Eyes:     Extraocular Movements: Extraocular movements intact.     Conjunctiva/sclera: Conjunctivae normal.     Pupils: Pupils are equal, round, and  reactive to light.  Cardiovascular:     Rate and Rhythm: Normal rate and regular rhythm.     Pulses: Normal pulses.     Heart sounds: Normal heart sounds. No murmur heard. Pulmonary:     Effort: Pulmonary effort is normal. No respiratory distress.     Breath sounds: Wheezing present.  Abdominal:     Palpations: Abdomen is soft.     Tenderness: There is no abdominal tenderness.  Musculoskeletal:        General: No swelling. Normal range of motion.     Cervical back: Normal range of motion and neck supple.     Right lower leg: No edema.     Left lower leg: No edema.  Skin:    General: Skin is warm and dry.     Capillary Refill: Capillary refill takes less than 2 seconds.  Neurological:     Mental Status: She is alert.   Psychiatric:        Mood and Affect: Mood normal.     ED Results / Procedures / Treatments   Labs (all labs ordered are listed, but only abnormal results are displayed) Labs Reviewed  BASIC METABOLIC PANEL - Abnormal; Notable for the following components:      Result Value   Glucose, Bld 109 (*)    All other components within normal limits  RESP PANEL BY RT-PCR (RSV, FLU A&B, COVID)  [id number]  CBC WITH DIFFERENTIAL/PLATELET  D-DIMER, QUANTITATIVE  BRAIN NATRIURETIC PEPTIDE  TROPONIN I (HIGH SENSITIVITY)    EKG EKG Interpretation Date/Time:  Tuesday August 03 2023 08:12:55 EDT Ventricular Rate:  63 PR Interval:  171 QRS Duration:  81 QT Interval:  424 QTC Calculation: 434 R Axis:   58  Text Interpretation: Sinus rhythm Consider left atrial enlargement Confirmed by Virgina Norfolk [id number]) on 08/03/2023 8:17:49 AM  Radiology DG Chest Portable 1 View  Result Date: 08/03/2023 CLINICAL DATA:  Shortness of breath. EXAM: PORTABLE CHEST 1 VIEW COMPARISON:  Chest radiograph dated Mar 01, 2023. FINDINGS: The heart size and mediastinal contours are within normal limits. Both lungs are clear. No pneumothorax or sizable pleural effusion. No acute osseous abnormality. IMPRESSION: No acute cardiopulmonary findings. Electronically Signed   By: Hart Robinsons M.D.   On: 08/03/2023 09:26    Procedures Procedures    Medications Ordered in ED Medications  albuterol (VENTOLIN HFA) 108 (90 Base) MCG/ACT inhaler 2 puff (2 puffs Inhalation Given 08/03/23 0847)    ED Course/ Medical Decision Making/ A&P                                 Medical Decision Making Amount and/or Complexity of Data Reviewed Labs: ordered. Radiology: ordered.  Risk Prescription drug management.   [facility] is here shortness of breath.  Unremarkable vitals.  No fever.  Differential diagnosis bronchitis versus pneumonia versus less likely PE, ACS or other emergent process.  Will check CBC BMP  troponin BNP chest x-ray COVID and flu test.  Will give breathing treatment will get D-dimer.  EKG shows sinus rhythm.  No ischemic changes per my review and interpretation of EKG.  Per my review interpretation the labs no significant anemia or electrolyte abnormality or kidney injury or leukocytosis.  Troponin normal.  No signs of volume overload on chest x-ray per my review and interpretation.  No pneumonia.  Overall I do think may be a mild/resolving bronchitis.  I have encouraged  her to take steroids that have been prescribed to her already.  Follow-up with primary care doctor.  D-dimer was normal.  Have no concern for ACS or PE or heart failure.  Understands return precautions.  Discharged in good condition.  This chart was dictated using voice recognition software.  Despite best efforts to proofread,  errors can occur which can change the documentation meaning.         Final Clinical Impression(s) / ED Diagnoses Final diagnoses:  SOB (shortness of breath)    Rx / DC Orders ED Discharge Orders     None         Virgina Norfolk, DO 08/03/23 1032

## 2023-08-03 NOTE — ED Triage Notes (Signed)
Pt reports that she has been having some shortness of breath for a few weeks. Was seen a urgent care a few weeks ago and they diagnosed her with bronchitis. She has recent ABT use for bronchitis. States that she woke up this morning her lips were blue.

## 2023-08-04 ENCOUNTER — Other Ambulatory Visit: Payer: Self-pay

## 2023-08-04 MED ORDER — VERAPAMIL HCL ER 240 MG PO TBCR: 240.0000 mg | EXTENDED_RELEASE_TABLET | Freq: Every day | ORAL | 0 refills | Status: AC

## 2023-08-09 ENCOUNTER — Ambulatory Visit
Admission: RE | Admit: 2023-08-09 | Discharge: 2023-08-09 | Disposition: A | Discharge status: Home / Self Care | Payer: BC Managed Care – PPO | Source: Ambulatory Visit | Attending: Family Medicine | Admitting: Family Medicine

## 2023-08-09 DIAGNOSIS — Z1231 Encounter for screening mammogram for malignant neoplasm of breast: Secondary | ICD-10-CM

## 2023-08-19 ENCOUNTER — Other Ambulatory Visit: Payer: Self-pay

## 2023-09-24 ENCOUNTER — Other Ambulatory Visit (INDEPENDENT_AMBULATORY_CARE_PROVIDER_SITE_OTHER): Payer: BC Managed Care – PPO

## 2023-09-24 ENCOUNTER — Ambulatory Visit (HOSPITAL_BASED_OUTPATIENT_CLINIC_OR_DEPARTMENT_OTHER): Payer: BC Managed Care – PPO | Admitting: Cardiovascular Disease

## 2023-09-24 ENCOUNTER — Encounter (HOSPITAL_BASED_OUTPATIENT_CLINIC_OR_DEPARTMENT_OTHER): Payer: Self-pay | Admitting: Cardiovascular Disease

## 2023-09-24 VITALS — BP 124/66 | HR 64 | Ht 66.0 in | Wt 232.5 lb

## 2023-09-24 DIAGNOSIS — I10 Essential (primary) hypertension: Secondary | ICD-10-CM

## 2023-09-24 DIAGNOSIS — I4729 Other ventricular tachycardia: Secondary | ICD-10-CM

## 2023-09-24 DIAGNOSIS — M79605 Pain in left leg: Secondary | ICD-10-CM

## 2023-09-24 DIAGNOSIS — M79604 Pain in right leg: Secondary | ICD-10-CM | POA: Diagnosis not present

## 2023-09-24 NOTE — Patient Instructions (Addendum)
Medication Instructions:  Your physician recommends that you continue on your current medications as directed. Please refer to the Current Medication list given to you today.  *If you need a refill on your cardiac medications before your next appointment, please call your pharmacy*  Lab Work: FASTING LP/CMET/TSH/FT4/MAGNESIUM SOON   If you have labs (blood work) drawn today and your tests are completely normal, you will receive your results only by: MyChart Message (if you have MyChart) OR A paper copy in the mail If you have any lab test that is abnormal or we need to change your treatment, we will call you to review the results.  Testing/Procedures: Your physician has requested that you have an ankle brachial index (ABI). During this test an ultrasound and blood pressure cuff are used to evaluate the arteries that supply the arms and legs with blood. Allow thirty minutes for this exam. There are no restrictions or special instructions.  Please note: We ask at that you not bring children with you during ultrasound (echo/ vascular) testing. Due to room size and safety concerns, children are not allowed in the ultrasound rooms during exams. Our front office staff cannot provide observation of children in our lobby area while testing is being conducted. An adult accompanying a patient to their appointment will only be allowed in the ultrasound room at the discretion of the ultrasound technician under special circumstances. We apologize for any inconvenience.  14 DAY ZIO   Follow-Up: At [facility], you and your health needs are our priority.  As part of our continuing mission to provide you with exceptional heart care, we have created designated Provider Care Teams.  These Care Teams include your primary Cardiologist (physician) and Advanced Practice Providers (APPs -  Physician Assistants and Nurse Practitioners) who all work together to provide you with the care you need, when you need  it.  We recommend signing up for the patient portal called "MyChart".  Sign up information is provided on this After Visit Summary.  MyChart is used to connect with patients for Virtual Visits (Telemedicine).  Patients are able to view lab/test results, encounter notes, upcoming appointments, etc.  Non-urgent messages can be sent to your provider as well.   To learn more about what you can do with MyChart, go to [email or web address].    Your next appointment:   2 month(s)  Provider:   Gillian Shields, NP    Other Instructions  Ruth Matthews- Long Term Monitor Instructions  Your physician has requested you wear a ZIO patch monitor for 14 days.  This is a single patch monitor. [employer] supplies one patch monitor per enrollment. Additional stickers are not available. Please do not apply patch if you will be having a Nuclear Stress Test,  Echocardiogram, Cardiac CT, MRI, or Chest Xray during the period you would be wearing the  monitor. The patch cannot be worn during these tests. You cannot remove and re-apply the  ZIO XT patch monitor.  Your ZIO patch monitor will be mailed 3 day USPS to your address on file. It may take 3-5 days  to receive your monitor after you have been enrolled.  Once you have received your monitor, please review the enclosed instructions. Your monitor  has already been registered assigning a specific monitor serial # to you.  Billing and Patient Assistance Program Information  We have supplied [employer] with any of your insurance information on file for billing purposes. [employer] offers a sliding scale Patient Assistance Program for patients that  do not have  insurance, or whose insurance does not completely cover the cost of the ZIO monitor.  You must apply for the Patient Assistance Program to qualify for this discounted rate.  To apply, please call [employer] at [phone], select option 4, select option 2, ask to apply for  Patient Assistance Program. Meredeth Ide will ask  your household income, and how many people  are in your household. They will quote your out-of-pocket cost based on that information.  [employer] will also be able to set up a 29-month, interest-free payment plan if needed.  Applying the monitor   Shave hair from upper left chest.  Hold abrader disc by orange tab. Rub abrader in 40 strokes over the upper left chest as  indicated in your monitor instructions.  Clean area with 4 enclosed alcohol pads. Let dry.  Apply patch as indicated in monitor instructions. Patch will be placed under collarbone on left  side of chest with arrow pointing upward.  Rub patch adhesive wings for 2 minutes. Remove white label marked "1". Remove the white  label marked "2". Rub patch adhesive wings for 2 additional minutes.  While looking in a mirror, press and release button in center of patch. A small green light will  flash 3-4 times. This will be your only indicator that the monitor has been turned on.  Do not shower for the first 24 hours. You may shower after the first 24 hours.  Press the button if you feel a symptom. You will hear a small click. Record Date, Time and  Symptom in the Patient Logbook.  When you are ready to remove the patch, follow instructions on the last 2 pages of Patient  Logbook. Stick patch monitor onto the last page of Patient Logbook.  Place Patient Logbook in the blue and white box. Use locking tab on box and tape box closed  securely. The blue and white box has prepaid postage on it. Please place it in the mailbox as  soon as possible. Your physician should have your test results approximately 7 days after the  monitor has been mailed back to [facility].  Call [facility] Customer Care at [phone] if you have questions regarding  your ZIO XT patch monitor. Call them immediately if you see an orange light blinking on your  monitor.  If your monitor falls off in less than 4 days, contact our Monitor department at  [phone].  If your monitor becomes loose or falls off after 4 days call [employer] at [phone] for  suggestions on securing your monitor

## 2023-09-24 NOTE — Progress Notes (Unsigned)
Cardiology Office Note:  .   Date:  09/26/2023  ID:  Ruth Matthews, DOB [birth year], MRN [id number] PCP: Jaynee Eagles  [facility] Providers Cardiologist:  None    History of Present Illness: .   Ruth Matthews is a 57 y.o. female with hypertension, hyperlipidemia, fascicular VT and prior DVT here for palpitations.  She saw Dr. Anne Fu for palpitaitons in 2012.  She was found to have a wide complex tachcyardia.  He was given IV amiodarone and converted to sinus rhythm.  Dr Graciela Husbands recommended verapamil for L fascicular VT.  Echo and cardiac MRI were unremarkable.  She had a cardiac cath that was negative for CAD.  She saw Dr. Delton See in 2021 reporting in creased fatigue and shortness of breath. Repeat echo was unremarkable.  Coronary CT-A and ETT were both ordered but not completed.  She wore a monitor 10/2022 that revealed rare PACs and PVCs but no VT.    Ruth Matthews had a PE after a long car ride 01/2022.  This also occurred after receiving the COVID vaccine.  She saw Dr. Shari Prows 07/2022 and was doing well but generally fatigued and weak.    She was seen in the ED 07/2023 with shortness of breath.  Her blood pressure times 171/91.  Cardiac enzymes were negative.  It was felt to be due to bronchitis.  D-dimer was within normal limits.  Ruth Matthews presents with concerns of palpitations and fatigue, particularly in the mornings. She describes the sensation as a "fluttering" rather than the previous experience of VT. The patient has been monitoring these episodes using a cardio mobile device, which has shown instances of bradycardia and unclassified rhythms.  In addition to the palpitations, she reports frequent leg and foot cramps. She has been exercising consistently for several years and has lost about thirty pounds since August. Despite this, she describes a feeling of heaviness and tiredness in her legs, particularly during exercise.  Ruth Matthews also has a  history of chronic sinus infections, which occasionally result in snoring. She has been sleeping better recently, following the resolution of menopausal symptoms. She denies significant caffeine intake and reports no recent increase in life stressors.  She has a history of acid reflux, managed with omeprazole, and reports that symptoms are generally well-controlled. She also has a history of low vitamin D levels and borderline low-normal thyroid function. The patient's cholesterol has been slightly elevated in the past, but she has made dietary changes in an attempt to improve this.  Her symptoms have been more frequent since a recent episode of bronchitis or pneumonia. She also reports that her symptoms seem to be more noticeable since she has been recovering from this illness.     ROS:  As per HPI  Studies Reviewed: .       Echo 11/2021:  1. Left ventricular ejection fraction, by estimation, is 55 to 60%. Left  ventricular ejection fraction by 2D MOD biplane is 58.5 %. Left  ventricular ejection fraction by PLAX is 57 %. The left ventricle has  normal function. The left ventricle has no  regional wall motion abnormalities. Left ventricular diastolic parameters  were normal.   2. Right ventricular systolic function is normal. The right ventricular  size is normal. There is normal pulmonary artery systolic pressure.   3. Left atrial size was mildly dilated.   4. The mitral valve is normal in structure. No evidence of mitral valve  regurgitation. No evidence of mitral stenosis.  5. The aortic valve is tricuspid. Aortic valve regurgitation is not  visualized. No aortic stenosis is present.   6. The inferior vena cava is normal in size with <50% respiratory  variability, suggesting right atrial pressure of 8 mmHg.   14 Day Monitor 10/2021: Patch wear time was 13 days and 15 hours Predominant rhythm was NSR with average HR 76bpm Rare SVE and rare PVCs (<1%) Patient triggered events  correlate with sinus rhythm No sustained arrhythmias or significant pauses  Risk Assessment/Calculations:        Physical Exam:   VS:  BP 124/66   Pulse 64   Ht 5\' 6"  (1.676 m)   Wt 232 lb 8 oz (105.5 kg)   LMP 04/14/2020   SpO2 96%   BMI 37.53 kg/m  , BMI Body mass index is 37.53 kg/m. GENERAL:  Well appearing HEENT: Pupils equal round and reactive, fundi not visualized, oral mucosa unremarkable NECK:  No jugular venous distention, waveform within normal limits, carotid upstroke brisk and symmetric, no bruits, no thyromegaly LUNGS:  Clear to auscultation bilaterally HEART:  RRR.  PMI not displaced or sustained,S1 and S2 within normal limits, no S3, no S4, no clicks, no rubs, no murmurs ABD:  Flat, positive bowel sounds normal in frequency in pitch, no bruits, no rebound, no guarding, no midline pulsatile mass, no hepatomegaly, no splenomegaly EXT:  2 plus pulses throughout, no edema, no cyanosis no clubbing SKIN:  No rashes no nodules NEURO:  Cranial nerves II through XII grossly intact, motor grossly intact throughout PSYCH:  Cognitively intact, oriented to person place and time   ASSESSMENT AND PLAN: .    # Palpitations: # Fascicular VT:  Morning episodes of heart fluttering, possibly due to premature ventricular contractions (PVCs) or premature atrial contractions (PACs). No evidence of ventricular tachycardia (VT) on review of previous EKGs. -Order 14-day heart monitor to capture episodes and correlate with symptoms. -Consider potential contributing factors such as acid reflux and sleep apnea.  # Leg cramps Frequent severe leg and foot cramps. No evidence of peripheral vascular disease on physical exam. -Order Ankle-Brachial Index (ABI) to assess blood flow in the legs. -Check electrolytes including magnesium to rule out electrolyte imbalance as a cause of cramps.  # Hyperlipidemia Patient reports previous high cholesterol levels, but has made significant lifestyle  changes including weight loss and improved diet. -Order fasting lipid panel to assess current cholesterol levels.  Follow-up in 2 months to review results of heart monitor, ABI, and lab work.       Signed, Chilton Si, MD

## 2023-09-26 ENCOUNTER — Encounter (HOSPITAL_BASED_OUTPATIENT_CLINIC_OR_DEPARTMENT_OTHER): Payer: Self-pay | Admitting: Cardiovascular Disease

## 2023-09-28 ENCOUNTER — Encounter (HOSPITAL_COMMUNITY): Payer: BC Managed Care – PPO

## 2023-10-04 ENCOUNTER — Ambulatory Visit (HOSPITAL_COMMUNITY)
Admission: RE | Admit: 2023-10-04 | Discharge: 2023-10-04 | Disposition: A | Discharge status: Home / Self Care | Payer: BC Managed Care – PPO | Source: Ambulatory Visit | Attending: Cardiovascular Disease | Admitting: Cardiovascular Disease

## 2023-10-04 DIAGNOSIS — M79605 Pain in left leg: Secondary | ICD-10-CM | POA: Insufficient documentation

## 2023-10-04 DIAGNOSIS — M79604 Pain in right leg: Secondary | ICD-10-CM | POA: Insufficient documentation

## 2023-10-04 LAB — COMPREHENSIVE METABOLIC PANEL
ALT: 19 [IU]/L (ref 0–32)
AST: 19 [IU]/L (ref 0–40)
Albumin: 4.3 g/dL (ref 3.8–4.9)
Alkaline Phosphatase: 91 [IU]/L (ref 44–121)
BUN/Creatinine Ratio: 17 (ref 9–23)
BUN: 13 mg/dL (ref 6–24)
Bilirubin Total: 0.2 mg/dL (ref 0.0–1.2)
CO2: 22 mmol/L (ref 20–29)
Calcium: 9.4 mg/dL (ref 8.7–10.2)
Chloride: 103 mmol/L (ref 96–106)
Creatinine, Ser: 0.77 mg/dL (ref 0.57–1.00)
Globulin, Total: 3 g/dL (ref 1.5–4.5)
Glucose: 101 mg/dL — ABNORMAL HIGH (ref 70–99)
Potassium: 4.8 mmol/L (ref 3.5–5.2)
Sodium: 140 mmol/L (ref 134–144)
Total Protein: 7.3 g/dL (ref 6.0–8.5)
eGFR: 91 mL/min/{1.73_m2} (ref >59)

## 2023-10-04 LAB — LIPID PANEL
Chol/HDL Ratio: 3 {ratio} (ref 0.0–4.4)
Cholesterol, Total: 204 mg/dL — ABNORMAL HIGH (ref 100–199)
HDL: 69 mg/dL (ref >39)
LDL Chol Calc (NIH): 120 mg/dL — ABNORMAL HIGH (ref 0–99)
Triglycerides: 82 mg/dL (ref 0–149)
VLDL Cholesterol Cal: 15 mg/dL (ref 5–40)

## 2023-10-04 LAB — T4, FREE: Free T4: 1.23 ng/dL (ref 0.82–1.77)

## 2023-10-04 LAB — TSH: TSH: 1.09 u[IU]/mL (ref 0.450–4.500)

## 2023-10-04 LAB — MAGNESIUM: Magnesium: 2 mg/dL (ref 1.6–2.3)

## 2023-10-05 LAB — VAS US ABI WITH/WO TBI
Left ABI: 1.12
Right ABI: 1.16

## 2023-10-15 ENCOUNTER — Other Ambulatory Visit (INDEPENDENT_AMBULATORY_CARE_PROVIDER_SITE_OTHER): Payer: Self-pay

## 2023-10-15 ENCOUNTER — Ambulatory Visit: Payer: BC Managed Care – PPO | Admitting: Orthopedic Surgery

## 2023-10-15 DIAGNOSIS — M79671 Pain in right foot: Secondary | ICD-10-CM

## 2023-10-16 ENCOUNTER — Encounter: Payer: Self-pay | Admitting: Orthopedic Surgery

## 2023-10-16 ENCOUNTER — Other Ambulatory Visit: Payer: BC Managed Care – PPO

## 2023-10-16 NOTE — Progress Notes (Signed)
Office Visit Note   Patient: Ruth Matthews           Date of Birth: Jan 15, 1966           MRN: [id number] Visit Date: 10/15/2023 Requested by: Frederic Jericho, PA-C [id number] [place],  [place] [address] PCP: Frederic Jericho, PA-C  Subjective: Chief Complaint  Patient presents with   Right Foot - Pain    HPI: Ruth Matthews is a 57 y.o. female who presents to the office reporting right foot pain of long duration which has been severe over the past 6 weeks.  She reports pain with prolonged walking or activity such as yoga and hiking.  She also describes plantar MTP pain with elliptical use.  The pain does wake her from sleep at night.  She does states that she has flareups which last for a week.  Is better with rest and elevation.  Pain localizes to the plantar first MTP region.  She does report swelling as well.  Has a history of low vitamin D but currently is on supplements and last level was 43.  Tylenol and heat does help.  She likes to hike with her husband several times a month.  Does not recall any injury to the foot..                ROS: All systems reviewed are negative as they relate to the chief complaint within the history of present illness.  Patient denies fevers or chills.  Assessment & Plan: Visit Diagnoses:  1. Pain in right foot     Plan: Impression is right foot pain with objective asymmetric swelling in the plantar MTP region right versus left.  She also has tenderness over the sesamoids.  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  She does have a little crepitus over the lateral sesamoid.  Could be stress fracture or stress reaction or nonunion of a sesamoid stress fracture on the medial aspect.  MRI scan indicated to evaluate the sesamoids for possible pain source.  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  Follow-Up Instructions: No follow-ups on file.   Orders:  Orders Placed This Encounter  Procedures    XR Foot Complete Right   MR Foot Right w/o contrast   No orders of the defined types were placed in this encounter.     Procedures: No procedures performed   Clinical Data: No additional findings.  Objective: Vital Signs: LMP 04/14/2020   Physical Exam:  Constitutional: Patient appears well-developed HEENT:  Head: Normocephalic Eyes:EOM are normal Neck: Normal range of motion Cardiovascular: Normal rate Pulmonary/chest: Effort normal Neurologic: Patient is alert Skin: Skin is warm Psychiatric: Patient has normal mood and affect  Ortho Exam: Ortho exam demonstrates normal gait alignment.  She does have mild swelling in the plantar right MTP region versus left.  Toth flexion extension intact at the great toe and lesser toes in the right and left foot.  She has palpable intact nontender anterior to posterior to peroneal and Achilles tendons with no masses lymphadenopathy or skin changes noted in that right foot region other than those described.  Slight amount of crepitus with palpation over the lateral sesamoid with toe range of motion.  No bunion type deformity present.  Specialty Comments:  No specialty comments available.  Imaging: XR Foot Complete Right Result Date: 10/16/2023 AP lateral oblique radiographs right foot reviewed.  No acute fracture.  Minimal arthritis in the MTP joints.  Tarsometatarsal alignment intact.  Bipartite medial sesamoid is present with slightly sclerotic lateral sesamoid.    PMFS History: Patient Active Problem List   Diagnosis Date Noted   Bronchitis, mucopurulent recurrent (HCC) 03/03/2023   Recurrent sinusitis 03/03/2023   Precordial chest pain 04/22/2022   Leg swelling 11/24/2021   COVID-19 virus infection 11/24/2021   Dizziness 10/23/2021   Shortness of breath 09/22/2021   Multiple subsegmental pulmonary emboli without acute cor pulmonale (HCC) 09/22/2021   Laryngopharyngeal reflux (LPR) 06/11/2020   Sensorineural hearing loss  (SNHL) of left ear with unrestricted hearing of right ear 06/11/2020   Tinnitus, left 06/11/2020   Tonsillar calculus 06/11/2020   Gastroesophageal reflux disease 02/23/2020   NAFLD (nonalcoholic fatty liver disease) 02/72/5366   Arthralgia 04/29/2018   Migraines 08/06/2017   Superficial thrombophlebitis 04/03/2017   Anxiety and depression 10/27/2016   Insomnia 07/17/2016   History of DVT (deep vein thrombosis) 01/20/2016   Allergic rhinitis 12/25/2015   Obesity 12/25/2015   Sinus pressure 12/25/2015   Tachycardia 12/25/2015   Varicose veins of both legs with edema 12/25/2015   Vitamin D deficiency 12/25/2015   DVT (deep venous thrombosis) (HCC) 04/23/2015   Paroxysmal ventricular tachycardia (HCC) 12/11/2013   Essential hypertension, benign 12/11/2013   Other and unspecified hyperlipidemia 12/11/2013   Edema 12/11/2013   Other malaise and fatigue 12/11/2013   Past Medical History:  Diagnosis Date   Anxiety    DVT (deep venous thrombosis) (HCC)    Elevated fasting blood sugar    Gallstone    Gestational diabetes 2000   Hypertension    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 Anne Fu, Dr. Graciela Husbands   Obesity    Ovarian cyst    ruptured   [facility] spotted fever) 2004   history of    Varicose veins    Ventricular tachycardia (HCC)    Vitamin D deficiency     Family History  Problem Relation Age of Onset   Hypertension Mother    COPD Mother    Other Father        died from accident, h/o lyme disease   Colon cancer Neg Hx    Esophageal cancer Neg Hx     Past Surgical History:  Procedure Laterality Date   CARDIAC CATHETERIZATION     no CAD   CESAREAN SECTION     CHOLECYSTECTOMY     ENDOVENOUS ABLATION SAPHENOUS VEIN W/ LASER Bilateral 02/2011   Both legs a few weeks apart   LIVER BIOPSY     Social History   Occupational History   Not on file  Tobacco Use   Smoking status: Never   Smokeless tobacco: Never  Vaping Use   Vaping status:  Never Used  Substance and Sexual Activity   Alcohol use: Yes    Alcohol/week: 0.0 standard drinks of alcohol    Comment: occasionally   Drug use: No   Sexual activity: Yes    Partners: Male    Comment: 1st intercourse-22, partners- 3, married- 30 yrs

## 2023-10-18 ENCOUNTER — Inpatient Hospital Stay
Admission: RE | Admit: 2023-10-18 | Discharge: 2023-10-18 | Discharge status: Home / Self Care | Payer: BC Managed Care – PPO | Source: Ambulatory Visit | Attending: Orthopedic Surgery | Admitting: Orthopedic Surgery

## 2023-10-18 DIAGNOSIS — M79671 Pain in right foot: Secondary | ICD-10-CM

## 2023-11-01 ENCOUNTER — Telehealth: Payer: Self-pay

## 2023-11-01 NOTE — Telephone Encounter (Signed)
-----   Message from KANDICE Glendia Hutchinson sent at 11/01/2023  9:40 AM EST ----- I called.  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  Has some edema in this medial sesamoid.  This is where she is most tender.  Discussed with her options would be continue  with how she is versus ultrasound-guided injection versus excision.  If she went the excision route I would favor having Dr. Harden see her.  She is can to think things over.  I do not think she has a follow-up appointment but I would hold off on makin g her 1 and she is going to message me via MyChart with what she wants to do.  Thanks

## 2023-11-01 NOTE — Telephone Encounter (Signed)
Dr Dean called 

## 2023-11-08 ENCOUNTER — Encounter (HOSPITAL_BASED_OUTPATIENT_CLINIC_OR_DEPARTMENT_OTHER): Payer: Self-pay | Admitting: Cardiovascular Disease

## 2023-12-15 ENCOUNTER — Other Ambulatory Visit (HOSPITAL_COMMUNITY): Payer: Self-pay

## 2023-12-15 ENCOUNTER — Ambulatory Visit (HOSPITAL_BASED_OUTPATIENT_CLINIC_OR_DEPARTMENT_OTHER): Payer: BC Managed Care – PPO | Admitting: Pulmonary Disease

## 2023-12-15 ENCOUNTER — Encounter (HOSPITAL_BASED_OUTPATIENT_CLINIC_OR_DEPARTMENT_OTHER): Payer: Self-pay | Admitting: Pulmonary Disease

## 2023-12-15 ENCOUNTER — Ambulatory Visit (HOSPITAL_BASED_OUTPATIENT_CLINIC_OR_DEPARTMENT_OTHER): Payer: BC Managed Care – PPO

## 2023-12-15 VITALS — BP 122/80 | HR 89 | Ht 66.0 in | Wt 220.2 lb

## 2023-12-15 DIAGNOSIS — R059 Cough, unspecified: Secondary | ICD-10-CM | POA: Diagnosis not present

## 2023-12-15 DIAGNOSIS — J42 Unspecified chronic bronchitis: Secondary | ICD-10-CM

## 2023-12-15 MED ORDER — BREZTRI AEROSPHERE 160-9-4.8 MCG/ACT IN AERO: 2.0000 | INHALATION_SPRAY | Freq: Two times a day (BID) | RESPIRATORY_TRACT | Status: AC

## 2023-12-15 MED ORDER — BUDESONIDE-FORMOTEROL FUMARATE 160-4.5 MCG/ACT IN AERO: 2.0000 | INHALATION_SPRAY | Freq: Two times a day (BID) | RESPIRATORY_TRACT | 5 refills | Status: AC

## 2023-12-15 NOTE — Progress Notes (Signed)
Pt notified on VM. 

## 2023-12-15 NOTE — Progress Notes (Signed)
 Subjective:   PATIENT ID: Ruth Matthews GENDER: female DOB: [birth year], MRN: [id number]   HPI  Chief Complaint  Patient presents with   Follow-up    Bronchitis     Reason for Visit: Follow-up  Ruth Matthews is a 58 year old female with fibromyalgia, PE in 2022, hx DVT in 2012 who presents for follow-up.  Synopsis Initially seen for PE in October after a recent long drive from [place] (08/02/21) and COVID vaccination (Oct 11). She was treated for post-op DVT with warfarin x 3 months in 2012. Hematology with negative hypercoag panel at the time. After her PE diagnosis she is now on lifetime anticoagulation per Hematology. She is compliant with Eliquis.   She was seen by her PCP on 09/09/21. She has had ongoing dyspnea on exertion chest discomfort dizziness and fatigue. Today she reports she has improved dyspnea by 80% at rest only feels 60% with exertion compared to baseline with persistent symptoms that have not changed.  Worsens with activity, bending over and lifting objects <10 lbs (cat). Shortness of breath with laying down has been more difficult. Some wheezing at night. Some coughing during the day where she is clearing her throat. Recent nasal congestion. At baseline, she has seasonal allergies and able to exercise everyday for 30 - 60 minutes (iFit: elliptical, walking, hiking). After the PE, she completely stopped activity. She has returned to exercise every other day due to fatigue. Denies any recent viral illnesses. For her allergies she is taking allegra, flonase, neti pot. She reports she has ongoing lower extremity pain that has worsened.  11/24/21 Since her last visit she was diagnosed with COVID on 11/11/2021. I had called her and discussed strategies to improve bleeding including Mucinex, deep breaths, daily activity and prone positioning.  Her oxygen had improved to around 95% with activity but would go to low 90s at rest. Last week she had chest tightness  and back pain that has improved. She feels like her left lung is sometimes having pain with deep breaths. Has some wheezing or coughing. She continues to have chills, no fevers. She has been attempting to exercise daily with an elliptical, bike or walking. She is using i[employer] to motivate her with exercise. She reports left knee pain and behind on her posterior gluteus.  12/23/21 Since her last visit she was treated with a course of Augmentin with a CT chest follow-up and echocardiogram for further evaluation.  She tried using her albuterol twice a day but stopped recently with no change in her shortness of breath. Exercise is still not to the level she was before. She continues to have lower extremity swelling that worsens with periods of sitting and standing.  04/14/23 Since our last visit she was seen by NP Cobb in Pulmonary for acute bronchitis, no antibiotics given then. She went to an urgent care completed another course by Augmentin x 10 days that was completed 3 days ago. She is developing has nasal and sinus congestion, tender neck and chills. Currently not on Symbicort but not having pulmonary symptoms. Usually develops symptoms if sinus symptoms are prolonged. Exercises daily  12/15/23 Since our last visit she was seen in the ED in October 2024 after completing 1-2 rounds of antibiotics as an outpatient however work-up unrevealing and suspected resolving bronchitis at that time. She reports she struggled during the holidays with difficulty breathing with activity, productive cough and wheezing. She had to be placed on antibiotics and prednisone in December with  [facility]. Symptoms improve with antibiotics but recur after completing. January was her best month. In February she was diagnosed with influenza at urgent care and clinically diagnosed with pneumonia and treated with doxycycline and ventolin. Unable to tell if inhaler is helpful. Still has wheezing in the morning and continues to have productive  cough throughout the day. Has not tried mucinex consistently.  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 Anne Fu, Dr. Graciela Husbands   Obesity    Ovarian cyst    ruptured   [facility] spotted fever) 2004   history of    Varicose veins    Ventricular tachycardia (HCC)    Vitamin D deficiency      Family History  Problem Relation Age of Onset   Hypertension Mother    COPD Mother    Other Father        died from accident, h/o lyme disease   Colon cancer Neg Hx    Esophageal cancer Neg Hx      Social History   Occupational History   Not on file  Tobacco Use   Smoking status: Never   Smokeless tobacco: Never  Vaping Use   Vaping status: Never Used  Substance and Sexual Activity   Alcohol use: Yes    Alcohol/week: 0.0 standard drinks of alcohol    Comment: occasionally   Drug use: No   Sexual activity: Yes    Partners: Male    Comment: 1st intercourse-22, partners- 3, married- 30 yrs     Allergies  Allergen Reactions   Garlic Swelling    Tongue swelling and sensation of throat swelling Tongue swelling and sensation of throat swelling    Latex Itching, Swelling and Rash   Azithromycin     Upset stomach    Mixed Grasses    Molds & Smuts    Onion Swelling    Tongue swelling and sensation of throat swelling Tongue swelling and sensation of throat swelling    Other     "whitening toothpaste"  "earring metals," "nickels and metals"    Penicillins Other (See Comments)    Was a child. Unknown side effects    Levaquin [Levofloxacin]      Outpatient Medications Prior to Visit  Medication Sig Dispense Refill   albuterol (VENTOLIN HFA) 108 (90 Base) MCG/ACT inhaler Inhale 2 puffs into the lungs every 4 (four) hours as needed.      cholecalciferol (VITAMIN D) 400 units/mL SOLN Take by mouth daily at 6 (six) AM.     ELIQUIS 5 MG TABS tablet Take 5 mg by mouth 2 (two) times daily.     EPINEPHrine 0.3 mg/0.3 mL IJ SOAJ injection      fexofenadine (ALLEGRA) 180 MG tablet Take 4 tablets by mouth daily.     Multiple Vitamin (MULTIVITAMIN ADULT PO) Take 1 tablet by mouth daily.     omeprazole (PRILOSEC) 20 MG capsule Take 1 capsule (20 mg total) by mouth 2 (two) times daily. For 4 weeks than decrease to daily 180 capsule 2   Propylene Glycol (SYSTANE BALANCE) 0.6 % SOLN Place 1 drop into both eyes daily as needed (dry eyes).     verapamil (CALAN-SR) 240 MG CR tablet Take  1 tablet (240 mg total) by mouth daily. 30 tablet 0   No facility-administered medications prior to visit.    Review of Systems  Constitutional:  Negative for chills, diaphoresis, fever, malaise/fatigue and weight loss.  HENT:  Negative for congestion.   Respiratory:  Positive for cough, sputum production, shortness of breath and wheezing. Negative for hemoptysis.   Cardiovascular:  Negative for chest pain, palpitations and leg swelling.     Objective:   Vitals:   12/15/23 1018  BP: 122/80  Pulse: 89  SpO2: 97%  Weight: 220 lb 3.2 oz (99.9 kg)  Height: 5\' 6"  (1.676 m)  Body mass index is 35.54 kg/m.  SpO2: 97 %  Physical Exam: General: Well-appearing, no acute distress HENT: [place], AT Eyes: EOMI, no scleral icterus Respiratory: Clear to auscultation bilaterally.  No crackles, wheezing or rales Cardiovascular: RRR, -M/R/G, no JVD Extremities:-Edema,-tenderness Neuro: AAO x4, CNII-XII grossly intact Psych: Normal mood, normal affect    Data Reviewed:  Imaging: CTA 08/05/21 - Multiple bilateral pulmonary emboli in the upper and lower lobes bilaterally CXR 11/24/2021-no focal consolidation.  Possible nodular opacity in the right lower lung. CT chest 12/17/2021-No pulmonary nodule noted. No significant GGO, septal thickening, subpleural  reticulation, traction bronchiectasis or honeycombing present. CXR 12/15/23 - Normal CXR, no infiltrate effusion or edema  PFT: 10/17/2021 FVC 3.77 (101%) FEV1 3.08 (105%) ratio 81 TLC 101% DLCO 101% Interpretation: normal spirometry with no evidence of obstructive or restrictive defect  04/13/23 FVC 3.79 (102%) FEV1 3.03 (105%) Ratio 80  TLC 99% DLCO 98% Interpretation: Normal spirometry  Echo: 12/01/2021-EF 55 to 60%. No WMA.  Normal diastolic parameters.  RV function and size is normal.  PASP no valvular abnormalities  Labs: CBC    Component Value Date/Time   WBC 4.4 08/03/2023 0838   RBC 4.86 08/03/2023 0838   HGB 12.8 08/03/2023 0838   HGB 13.7 07/24/2020 0912   HCT 39.3 08/03/2023 0838   HCT 41.3 07/24/2020 0912   PLT 186 08/03/2023 0838   PLT 218 07/24/2020 0912   MCV 80.9 08/03/2023 0838   MCV 82 07/24/2020 0912   MCH 26.3 08/03/2023 0838   MCHC 32.6 08/03/2023 0838   RDW 15.4 08/03/2023 0838   RDW 14.6 07/24/2020 0912   LYMPHSABS 1.2 08/03/2023 0838   MONOABS 0.4 08/03/2023 0838   EOSABS 0.1 08/03/2023 0838   BASOSABS 0.1 08/03/2023 0838   Absolute eos 09/04/19 -200   Assessment & Plan:   Discussion: 58 year old female never smoker with hx recurrent sinusitis and bronchitis, fibromyalgia, hx of PE/DVT on Eliquis who presents for follow-up. Reviewed history with recent episodes of bronchitis since October 2024 with persistent symptoms. 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 CXR obtained and neg for acute infiltrate. Discussed clinical course and management of chronic bronchitis including trial of bronchodilator regimen, preventive care and action plan for exacerbation. Advised to optimize sinus symptoms with nasal spray and allergy meds.  Chronic bronchitis Post viral asthma --Reviewed PFTs 2024. Normal --START generic Symbicort 160-4.5 mcg TWO puffs in the  morning and evening when ill. Rinse mouth out after use. --CONTINUE Albuterol AS NEEDED for shortness of breath or wheezing --Go to 2nd floor for CXR. Will call for results  Hx recurrent sinusitis --Continue Allegra, nettie pot --RESTART flonase nightly  Hx Pulmonary embolism 2022/DVT 2012 --Eliquis per hematology  Health Maintenance Immunization History  Administered Date(s) Administered   Influenza  Inj Mdck Quad Pf 07/27/2019   Influenza, High Dose Seasonal PF 01/03/2020   Influenza,inj,Quad PF,6+ Mos 08/12/2018   PFIZER(Purple Top)SARS-COV-2 Vaccination 12/18/2019, 01/03/2020   Pfizer Covid-19 Vaccine Bivalent Booster 43yrs & up 07/29/2021   Tdap 07/21/2021   CT Lung Screen - not indicated  Orders Placed This Encounter  Procedures   DG Chest 2 View    Standing Status:   Future    Number of Occurrences:   1    Expiration Date:   12/14/2024    Reason for Exam (SYMPTOM  OR DIAGNOSIS REQUIRED):   cough    Is patient pregnant?:   No    Preferred imaging location?:   [facility]   Meds ordered this encounter  Medications   budesonide-formoterol (SYMBICORT) 160-4.5 MCG/ACT inhaler    Sig: Inhale 2 puffs into the lungs 2 (two) times daily.    Dispense:  10.2 each    Refill:  5    Generic only   Budeson-Glycopyrrol-Formoterol (BREZTRI AEROSPHERE) 160-9-4.8 MCG/ACT AERO    Sig: Inhale 2 puffs into the lungs in the morning and at bedtime.    Lot Number?:   [id number]    Expiration Date?:   12/16/2025    NDC:   3244-0102-72 [536644]    Return for mid April.   I have spent a total time of 30-minutes on the day of the appointment reviewing prior documentation, coordinating care and discussing medical diagnosis and plan with the patient/family. Past medical history, allergies, medications were reviewed. Pertinent imaging, labs and tests included in this note have been reviewed and interpreted independently by me.  Daisha Filosa Mechele Collin, MD [facility] 12/15/2023 12:59 PM

## 2023-12-15 NOTE — Patient Instructions (Signed)
 Chronic bronchitis Post viral asthma --Reviewed PFTs 2024. Normal --START generic Symbicort 160-4.5 mcg TWO puffs in the morning and evening when ill. Rinse mouth out after use. --CONTINUE Albuterol AS NEEDED for shortness of breath or wheezing --Go to 2nd floor for CXR. Will call for results

## 2023-12-21 ENCOUNTER — Ambulatory Visit (HOSPITAL_BASED_OUTPATIENT_CLINIC_OR_DEPARTMENT_OTHER): Payer: BC Managed Care – PPO | Admitting: Family

## 2024-02-10 ENCOUNTER — Ambulatory Visit (HOSPITAL_BASED_OUTPATIENT_CLINIC_OR_DEPARTMENT_OTHER): Payer: BC Managed Care – PPO | Admitting: Pulmonary Disease

## 2024-08-21 ENCOUNTER — Encounter: Payer: Self-pay | Admitting: Radiology

## 2024-08-23 ENCOUNTER — Telehealth: Payer: Self-pay | Admitting: Cardiovascular Disease

## 2024-08-23 NOTE — Telephone Encounter (Signed)
 Pt c/o BP issue: STAT if pt c/o blurred vision, one-sided weakness or slurred speech  1. What are your last 5 BP readings? 149/87 yesterday afternoon, 138/97 yesterday morning, highest was 145/103 on Monday morning.   2. Are you having any other symptoms (ex. Dizziness, headache, blurred vision, passed out)? Dizziness, headaches, fatigue and just not feeling well.   3. What is your BP issue? Pt has concerns about her BP. She'd like to speak with a nurse about being seen sooner than date offered. Please advise

## 2024-08-23 NOTE — Telephone Encounter (Signed)
 Spoke with patient regarding blood pressure  Doesn't take medications for her blood pressure  Advised to reach out to PCP, cancelled 2 month follow up with Reche ORN NP and 07/2024 visit with Dr Okey Patient verbalized understanding  Scheduled her next available follow up

## 2024-11-10 ENCOUNTER — Encounter (HOSPITAL_BASED_OUTPATIENT_CLINIC_OR_DEPARTMENT_OTHER): Payer: Self-pay | Admitting: Family

## 2024-11-10 ENCOUNTER — Ambulatory Visit (HOSPITAL_BASED_OUTPATIENT_CLINIC_OR_DEPARTMENT_OTHER): Admitting: Family

## 2024-11-10 VITALS — BP 132/74 | HR 73 | Ht 66.0 in | Wt 256.1 lb

## 2024-11-10 DIAGNOSIS — I471 Supraventricular tachycardia, unspecified: Secondary | ICD-10-CM

## 2024-11-10 DIAGNOSIS — R002 Palpitations: Secondary | ICD-10-CM

## 2024-11-10 DIAGNOSIS — E782 Mixed hyperlipidemia: Secondary | ICD-10-CM

## 2024-11-10 NOTE — Patient Instructions (Signed)
 Medication Instructions:  Continue your current medications.   *If you need a refill on your cardiac medications before your next appointment, please call your pharmacy*  Testing/Procedures: Your EKG today looked great!  Follow-Up: At [facility], you and your health needs are our priority.  As part of our continuing mission to provide you with exceptional heart care, our providers are all part of one team.  This team includes your primary Cardiologist (physician) and Advanced Practice Providers or APPs (Physician Assistants and Nurse Practitioners) who all work together to provide you with the care you need, when you need it.  Your next appointment:   1 year(s)  Provider:   Annabella Scarce, MD, Rosaline Bane, NP, or Reche Finder, NP    We recommend signing up for the patient portal called MyChart.  Sign up information is provided on this After Visit Summary.  MyChart is used to connect with patients for Virtual Visits (Telemedicine).  Patients are able to view lab/test results, encounter notes, upcoming appointments, etc.  Non-urgent messages can be sent to your provider as well.   To learn more about what you can do with MyChart, go to [email or web address].   Other Instructions

## 2024-11-10 NOTE — Progress Notes (Signed)
 " Cardiology Office Note   Date:  11/10/2024  ID:  Ruth Matthews, [place] 01-16-1966, MRN [id number] PCP: Latisha Ronal Crank, PA-C  [facility] Providers Cardiologist:  Annabella Scarce, MD Cardiology APP:  Vannie Reche RAMAN, NP     History of Present Illness [name] is a 59 y.o. female  with a hx of HTN, fibromyalgia, paroxysmal VT, DVT (2012), HLD, PE (2023), prediabetes.    Initially evaluated in 2012 for palpitations and exertional presyncope. Diagnosed with wide-complex tachycardia with negative fluid affecting QRS complexes and lateral leads.  She also had right axis deviation negative deflections in leads II, 3, aVF.  She was given IV amiodarone and converted after about 6 minutes.  Brief episode of atrial fibrillation postconversion but remained in sinus rhythm.  She was recommended for verapamil  for treatment of bifascicular VT.  Cardiac MRI and echocardiogram were unremarkable.  She underwent LHC showing no coronary disease. Updated echocardiogram 01/06/2021 due to dyspnea with normal biventricular function and no significant valvular disease.     Monitor 10/2022 rare PAC and PVC.  Admission 01/2022 with finding of bilateral PE recommended for lifelong anticoagulation by hematology given prior DVT. PE was in setting of long car ride and COVID vaccine.  Seen by Dr. Scarce 09/24/23 with palpitations, leg/foot cramps, . ABI 09/2023 were unremarkable. 14 day ZIO 09/2023 with predominantly NSR, rare PVC/PAC with <1% burden.     She presents today for follow up. Works as a systems developer through 5 schools. Reports rare palpitations which self resolve and are brief. Exercising regularly with treadmill, hiking.  Eats predominantly at home. Reports episodes of L bicep pain which occur at random.  Reassurance prior to added atypical for angina. More recently some pain in her calf that felt different than her cramp, felt similar to previous  blood clot, but not currently having this pain.  No new edema, no leg pain with exercise, no missed doses of Eliquis. Also some pain in her neck randomly. Desribes as not neck ache but different. No reported amaurosis fugax. No near syncope, syncope.  Reassured as there was no carotid bruit on exam.  ROS: Please see the history of present illness.    All other systems reviewed and are negative.   Studies Reviewed EKG Interpretation Date/Time:  Friday November 10 2024 15:07:38 EST Ventricular Rate:  65 PR Interval:  162 QRS Duration:  74 QT Interval:  402 QTC Calculation: 418 R Axis:   10  Text Interpretation: Normal sinus rhythm  No acute ST/T wave changes Confirmed by Vannie Reche ([id number]) on 11/10/2024 3:34:52 PM    Cardiac Studies & Procedures   ______________________________________________________________________________________________     ECHOCARDIOGRAM  ECHOCARDIOGRAM COMPLETE 12/01/2021  Narrative ECHOCARDIOGRAM REPORT    Patient Name:   Ruth Matthews Date of Exam: 12/01/2021 Medical Rec #:  [id number]             Height:       66.0 in Accession #:    [id number]            Weight:       242.0 lb Date of Birth:  Jan 31, 1966             BSA:          2.169 m Patient Age:    55 years              BP:           138/90  mmHg Patient Gender: F                     HR:           93 bpm. Exam Location:  Outpatient  Procedure: 2D Echo, Cardiac Doppler, Color Doppler and Strain Analysis  Indications:    R07.9* Chest pain, unspecified; R06.9 DOE; R60.0 Lower extremity edema  History:        Patient has prior history of Echocardiogram examinations, most recent 01/06/2021. Signs/Symptoms:Chest Pain, Dyspnea and Edema; Risk Factors:Hypertension and Non-Smoker. Patient has intermittent chest pain with pressure with DOE for about a month since contracting CoVid-19 in 10/2021. Patient has had intermittent leg edema for years. History of pulmonary embolus in  07/2021.  Sonographer:    Annabella Cater RVT, RDCS (AE), RDMS Referring Phys: [phone] [facility]   Sonographer Comments: Suboptimal subcostal window and patient is morbidly obese. Image acquisition challenging due to respiratory motion and Image acquisition challenging due to patient body habitus. IMPRESSIONS   1. Left ventricular ejection fraction, by estimation, is 55 to 60%. Left ventricular ejection fraction by 2D MOD biplane is 58.5 %. Left ventricular ejection fraction by PLAX is 57 %. The left ventricle has normal function. The left ventricle has no regional wall motion abnormalities. Left ventricular diastolic parameters were normal. 2. Right ventricular systolic function is normal. The right ventricular size is normal. There is normal pulmonary artery systolic pressure. 3. Left atrial size was mildly dilated. 4. The mitral valve is normal in structure. No evidence of mitral valve regurgitation. No evidence of mitral stenosis. 5. The aortic valve is tricuspid. Aortic valve regurgitation is not visualized. No aortic stenosis is present. 6. The inferior vena cava is normal in size with <50% respiratory variability, suggesting right atrial pressure of 8 mmHg.  Comparison(s): EF 60%.  FINDINGS Left Ventricle: Left ventricular ejection fraction, by estimation, is 55 to 60%. Left ventricular ejection fraction by PLAX is 57 %. Left ventricular ejection fraction by 2D MOD biplane is 58.5 %. The left ventricle has normal function. The left ventricle has no regional wall motion abnormalities. The left ventricular internal cavity size was normal in size. There is no left ventricular hypertrophy. Left ventricular diastolic parameters were normal. Indeterminate filling pressures.  Right Ventricle: The right ventricular size is normal. No increase in right ventricular wall thickness. Right ventricular systolic function is normal. There is normal pulmonary artery systolic pressure. The  tricuspid regurgitant velocity is 2.57 m/s, and with an assumed right atrial pressure of 8 mmHg, the estimated right ventricular systolic pressure is 34.4 mmHg.  Left Atrium: Left atrial size was mildly dilated.  Right Atrium: Right atrial size was normal in size.  Pericardium: There is no evidence of pericardial effusion.  Mitral Valve: The mitral valve is normal in structure. No evidence of mitral valve regurgitation. No evidence of mitral valve stenosis.  Tricuspid Valve: The tricuspid valve is normal in structure. Tricuspid valve regurgitation is trivial. No evidence of tricuspid stenosis.  Aortic Valve: The aortic valve is tricuspid. Aortic valve regurgitation is not visualized. No aortic stenosis is present. Aortic valve mean gradient measures 3.0 mmHg. Aortic valve peak gradient measures 6.5 mmHg. Aortic valve area, by VTI measures 2.52 cm.  Pulmonic Valve: The pulmonic valve was normal in structure. Pulmonic valve regurgitation is mild. No evidence of pulmonic stenosis.  Aorta: The aortic root is normal in size and structure.  Venous: The inferior vena cava is normal in size with less than  50% respiratory variability, suggesting right atrial pressure of 8 mmHg.  IAS/Shunts: No atrial level shunt detected by color flow Doppler.   LEFT VENTRICLE PLAX 2D                        Biplane EF (MOD) LV EF:         Left            LV Biplane EF:   Left ventricular                      ventricular ejection                         ejection fraction by                      fraction by PLAX is 57                       2D MOD %.                               biplane is LVIDd:         4.11 cm                          58.5 %. LVIDs:         2.89 cm LV PW:         1.18 cm         Diastology LV IVS:        0.89 cm         LV e' medial:    9.03 cm/s LVOT diam:     2.10 cm         LV E/e' medial:  10.2 LV SV:         60              LV e' lateral:   11.30 cm/s LV SV Index:   28               LV E/e' lateral: 8.2 LVOT Area:     3.46 cm 2D Longitudinal LV Volumes (MOD)               Strain LV vol d, MOD    76.7 ml       2D Strain GLS  -22.3 % A2C:                           (A2C): LV vol d, MOD    84.9 ml       2D Strain GLS  -12.8 % A4C:                           (A3C): LV vol s, MOD    33.3 ml       2D Strain GLS  -16.7 % A2C:                           (A4C): LV vol s, MOD    34.4 ml       2D Strain GLS  -17.2 % A4C:  Avg: LV SV MOD A2C:   43.4 ml LV SV MOD A4C:   84.9 ml LV SV MOD BP:    48.6 ml 3D Volume EF: 3D EF:        68 % LV EDV:       111 ml LV ESV:       36 ml LV SV:        76 ml  RIGHT VENTRICLE RV S prime:     12.80 cm/s TAPSE (M-mode): 2.8 cm  LEFT ATRIUM             Index        RIGHT ATRIUM           Index LA diam:        4.10 cm 1.89 cm/m   RA Area:     17.50 cm LA Vol (A2C):   77.5 ml 35.74 ml/m  RA Volume:   48.00 ml  22.13 ml/m LA Vol (A4C):   61.1 ml 28.18 ml/m LA Biplane Vol: 69.5 ml 32.05 ml/m AORTIC VALVE                    PULMONIC VALVE AV Area (Vmax):    2.61 cm     PV Vmax:          0.96 m/s AV Area (Vmean):   2.52 cm     PV Peak grad:     3.7 mmHg AV Area (VTI):     2.52 cm     PR End Diast Vel: 5.66 msec AV Vmax:           127.00 cm/s AV Vmean:          84.500 cm/s AV VTI:            0.239 m AV Peak Grad:      6.5 mmHg AV Mean Grad:      3.0 mmHg LVOT Vmax:         95.80 cm/s LVOT Vmean:        61.600 cm/s LVOT VTI:          0.174 m LVOT/AV VTI ratio: 0.73  AORTA Ao Root diam: 3.50 cm Ao Asc diam:  3.50 cm Ao Arch diam: 3.1 cm  MITRAL VALVE               TRICUSPID VALVE MV Area (PHT): 4.17 cm    TR Peak grad:   26.4 mmHg MV Decel Time: 182 msec    TR Vmax:        257.00 cm/s MV E velocity: 92.20 cm/s MV A velocity: 69.20 cm/s  SHUNTS MV E/A ratio:  1.33        Systemic VTI:  0.17 m Systemic Diam: 2.10 cm  Annabella Scarce MD Electronically signed by Annabella Scarce MD Signature  Date/Time: 12/01/2021/11:05:18 AM    Final    MONITORS  LONG TERM MONITOR (3-14 DAYS) 10/08/2023  Narrative 10 Dya Zio Monitor  Quality: Fair.  Baseline artifact. Predominant rhythm: sinus rhythm Average heart rate: 69 bpm Max heart rate: 158 bpm Min heart rate: 40 bpm Pauses >2.5 seconds: none  Rare (<1%) PACs and PVCs   Tiffany C. Scarce, MD, [facility] 10/13/2023 11:54 PM     CARDIAC MRI  MR CARDIAC MORPHOLOGY W WO CONTRAST 10/30/2010  Narrative Cardiac MRI:  Indication: VT R/O infiltrative LV cardiomyopathy  Protocol:  The patient was scanned on a 1.5 Tesla GE magnet. Functional imaging was done using Fiesta sequences.  2,3 and 4 chamber views were done to assess RWMA's.  The patient received 50cc of Magnevist.  After 10 minutes inversion recovery sequences were done to assess for viability.  Quantitative EF was calculated using mass analysis software on a GE ADAC work-station  Findings:  All 4 cardiac chambers were normal in size and function. There was no ASD, VSD or pericardial effusion.  Mitral, aortic and tricuspid valves appeared morphologically normal.  Quantitative EF was 61% ( EDV-119cc, ESV-47cc, SV 72cc) Hyperenhancement images showed no infiltration or scar tissue in the LV  Impression:  1)    Normal cardiac MRI  2)    EF 61% with no RWMA's 3)    No infiltration or hyperenhancement in LV  Provider: Arna Ned   ______________________________________________________________________________________________      Risk Assessment/Calculations           Physical Exam VS:  BP 132/74   Pulse 73   Ht 5' 6 (1.676 m)   Wt 256 lb 1.6 oz (116.2 kg)   LMP 04/14/2020   SpO2 99%   BMI 41.34 kg/m        Wt Readings from Last 3 Encounters:  11/10/24 256 lb 1.6 oz (116.2 kg)  12/15/23 220 lb 3.2 oz (99.9 kg)  09/24/23 232 lb 8 oz (105.5 kg)    GEN: Well nourished, well developed in no acute distress NECK: No JVD; No carotid  bruits CARDIAC: RRR, no murmurs, rubs, gallops RESPIRATORY:  Clear to auscultation without rales, wheezing or rhonchi  ABDOMEN: Soft, non-tender, non-distended EXTREMITIES:  No edema; No deformity   ASSESSMENT AND PLAN  Palpitations / Fascicular SVT - rare not bothersome palpitations. Prior monitor <1% PAC/PVC burden, EKG today NSR. Continue Verapamil  240mg  daily.   HLD - 05/2024 LDL 102.  Mildly elevated, does not require cholesterol lowering therapy at this time. Exercises regularly and eats at home due to allergy intolerances. Recommend aiming for 150 minutes of moderate intensity activity per week and following a heart healthy diet.         Dispo: follow up in 1 year  Signed, Reche GORMAN Finder, NP   "
